# Patient Record
Sex: Male | Born: 1935 | Race: Black or African American | Hispanic: No | Smoking: Former smoker
Health system: Southern US, Community
[De-identification: ages and names within clinical notes are randomized; demographics above are authoritative.]

## PROBLEM LIST (undated history)

## (undated) DIAGNOSIS — N183 Chronic kidney disease, stage 3 unspecified: Secondary | ICD-10-CM

## (undated) DIAGNOSIS — K219 Gastro-esophageal reflux disease without esophagitis: Secondary | ICD-10-CM

## (undated) DIAGNOSIS — N400 Enlarged prostate without lower urinary tract symptoms: Secondary | ICD-10-CM

## (undated) DIAGNOSIS — I10 Essential (primary) hypertension: Secondary | ICD-10-CM

## (undated) DIAGNOSIS — IMO0002 Reserved for concepts with insufficient information to code with codable children: Secondary | ICD-10-CM

## (undated) DIAGNOSIS — M1990 Unspecified osteoarthritis, unspecified site: Secondary | ICD-10-CM

## (undated) DIAGNOSIS — I2510 Atherosclerotic heart disease of native coronary artery without angina pectoris: Secondary | ICD-10-CM

## (undated) DIAGNOSIS — M353 Polymyalgia rheumatica: Secondary | ICD-10-CM

## (undated) DIAGNOSIS — K529 Noninfective gastroenteritis and colitis, unspecified: Secondary | ICD-10-CM

## (undated) DIAGNOSIS — E785 Hyperlipidemia, unspecified: Secondary | ICD-10-CM

## (undated) DIAGNOSIS — I639 Cerebral infarction, unspecified: Secondary | ICD-10-CM

## (undated) HISTORY — DX: Chronic kidney disease, stage 3 unspecified: N18.30

## (undated) HISTORY — DX: Cerebral infarction, unspecified: I63.9

## (undated) HISTORY — DX: Gastro-esophageal reflux disease without esophagitis: K21.9

## (undated) HISTORY — PX: ANGIOPLASTY: SHX39

## (undated) HISTORY — DX: Essential (primary) hypertension: I10

## (undated) HISTORY — DX: Reserved for concepts with insufficient information to code with codable children: IMO0002

## (undated) HISTORY — PX: CATARACT EXTRACTION: SUR2

## (undated) HISTORY — DX: Chronic kidney disease, stage 3 (moderate): N18.3

## (undated) HISTORY — DX: Hyperlipidemia, unspecified: E78.5

## (undated) HISTORY — DX: Unspecified osteoarthritis, unspecified site: M19.90

## (undated) HISTORY — PX: ESOPHAGEAL DILATION: SHX303

## (undated) HISTORY — DX: Benign prostatic hyperplasia without lower urinary tract symptoms: N40.0

## (undated) HISTORY — DX: Atherosclerotic heart disease of native coronary artery without angina pectoris: I25.10

## (undated) HISTORY — DX: Noninfective gastroenteritis and colitis, unspecified: K52.9

## (undated) HISTORY — DX: Polymyalgia rheumatica: M35.3

---

## 2004-04-07 HISTORY — PX: US ECHOCARDIOGRAPHY: HXRAD669

## 2004-04-11 ENCOUNTER — Other Ambulatory Visit: Payer: Self-pay

## 2004-11-11 ENCOUNTER — Ambulatory Visit: Payer: Self-pay | Admitting: Internal Medicine

## 2005-03-11 ENCOUNTER — Ambulatory Visit: Payer: Self-pay | Admitting: Internal Medicine

## 2005-06-08 ENCOUNTER — Ambulatory Visit: Payer: Self-pay | Admitting: Rheumatology

## 2005-07-13 ENCOUNTER — Ambulatory Visit: Payer: Self-pay | Admitting: Internal Medicine

## 2005-08-29 ENCOUNTER — Ambulatory Visit: Payer: Self-pay | Admitting: Internal Medicine

## 2005-09-01 ENCOUNTER — Ambulatory Visit: Payer: Self-pay | Admitting: Internal Medicine

## 2005-09-01 ENCOUNTER — Encounter: Payer: Self-pay | Admitting: Internal Medicine

## 2005-09-07 ENCOUNTER — Encounter: Payer: Self-pay | Admitting: Internal Medicine

## 2005-11-07 ENCOUNTER — Inpatient Hospital Stay: Payer: Self-pay

## 2005-11-07 ENCOUNTER — Other Ambulatory Visit: Payer: Self-pay

## 2005-11-14 ENCOUNTER — Ambulatory Visit: Payer: Self-pay | Admitting: Internal Medicine

## 2005-12-27 ENCOUNTER — Ambulatory Visit: Payer: Self-pay | Admitting: Internal Medicine

## 2006-03-21 ENCOUNTER — Ambulatory Visit: Payer: Self-pay | Admitting: Internal Medicine

## 2006-03-29 ENCOUNTER — Ambulatory Visit: Payer: Self-pay | Admitting: Internal Medicine

## 2006-04-26 ENCOUNTER — Ambulatory Visit: Payer: Self-pay | Admitting: Internal Medicine

## 2006-05-08 ENCOUNTER — Other Ambulatory Visit: Payer: Self-pay

## 2006-05-08 ENCOUNTER — Encounter: Payer: Self-pay | Admitting: Internal Medicine

## 2006-05-08 ENCOUNTER — Emergency Department: Payer: Self-pay | Admitting: Emergency Medicine

## 2006-05-29 ENCOUNTER — Encounter: Payer: Self-pay | Admitting: Internal Medicine

## 2006-06-13 ENCOUNTER — Ambulatory Visit: Payer: Self-pay | Admitting: Internal Medicine

## 2006-08-30 ENCOUNTER — Ambulatory Visit: Payer: Self-pay | Admitting: Internal Medicine

## 2006-11-22 ENCOUNTER — Encounter: Payer: Self-pay | Admitting: Internal Medicine

## 2007-01-01 ENCOUNTER — Ambulatory Visit: Payer: Self-pay | Admitting: Internal Medicine

## 2007-01-01 LAB — CONVERTED CEMR LAB
ALT: 12 units/L (ref 0–40)
HDL: 39.5 mg/dL (ref >39.0)
Total CHOL/HDL Ratio: 2.9

## 2007-04-20 DIAGNOSIS — I25119 Atherosclerotic heart disease of native coronary artery with unspecified angina pectoris: Secondary | ICD-10-CM

## 2007-04-20 DIAGNOSIS — E785 Hyperlipidemia, unspecified: Secondary | ICD-10-CM

## 2007-04-20 DIAGNOSIS — K219 Gastro-esophageal reflux disease without esophagitis: Secondary | ICD-10-CM

## 2007-04-27 DIAGNOSIS — E1140 Type 2 diabetes mellitus with diabetic neuropathy, unspecified: Secondary | ICD-10-CM

## 2007-04-27 DIAGNOSIS — N400 Enlarged prostate without lower urinary tract symptoms: Secondary | ICD-10-CM

## 2007-04-27 DIAGNOSIS — E1165 Type 2 diabetes mellitus with hyperglycemia: Secondary | ICD-10-CM

## 2007-04-27 DIAGNOSIS — I2510 Atherosclerotic heart disease of native coronary artery without angina pectoris: Secondary | ICD-10-CM | POA: Insufficient documentation

## 2007-05-07 ENCOUNTER — Ambulatory Visit: Payer: Self-pay | Admitting: Internal Medicine

## 2007-05-07 DIAGNOSIS — M1990 Unspecified osteoarthritis, unspecified site: Secondary | ICD-10-CM | POA: Insufficient documentation

## 2007-05-08 LAB — CONVERTED CEMR LAB
Albumin: 3.4 g/dL — ABNORMAL LOW
BUN: 26 mg/dL — ABNORMAL HIGH
CO2: 31 meq/L
Calcium: 9.3 mg/dL
Chloride: 108 meq/L
Creatinine, Ser: 1.5 mg/dL
GFR calc Af Amer: 57 mL/min
GFR calc non Af Amer: 47 mL/min
Glucose, Bld: 157 mg/dL — ABNORMAL HIGH
Hgb A1c MFr Bld: 6.5 % — ABNORMAL HIGH
Phosphorus: 2.9 mg/dL
Potassium: 4.5 meq/L
Sodium: 143 meq/L

## 2007-05-23 ENCOUNTER — Encounter: Payer: Self-pay | Admitting: Internal Medicine

## 2007-05-28 ENCOUNTER — Encounter: Payer: Self-pay | Admitting: Internal Medicine

## 2007-08-08 ENCOUNTER — Ambulatory Visit: Payer: Self-pay | Admitting: Internal Medicine

## 2007-08-08 LAB — CONVERTED CEMR LAB
Blood in Urine, dipstick: NEGATIVE
Ketones, urine, test strip: NEGATIVE
Protein, U semiquant: 30
RBC / HPF: 0
WBC Urine, dipstick: NEGATIVE
pH: 5

## 2007-11-07 ENCOUNTER — Encounter: Payer: Self-pay | Admitting: Internal Medicine

## 2007-11-22 ENCOUNTER — Encounter: Payer: Self-pay | Admitting: Internal Medicine

## 2007-12-03 ENCOUNTER — Telehealth (INDEPENDENT_AMBULATORY_CARE_PROVIDER_SITE_OTHER): Payer: Self-pay | Admitting: *Deleted

## 2007-12-10 ENCOUNTER — Telehealth (INDEPENDENT_AMBULATORY_CARE_PROVIDER_SITE_OTHER): Payer: Self-pay | Admitting: *Deleted

## 2007-12-11 ENCOUNTER — Ambulatory Visit: Payer: Self-pay | Admitting: Internal Medicine

## 2007-12-12 LAB — CONVERTED CEMR LAB
ALT: 9 units/L (ref 0–53)
Albumin: 3.3 g/dL — ABNORMAL LOW (ref 3.5–5.2)
BUN: 20 mg/dL (ref 6–23)
Calcium: 9.4 mg/dL (ref 8.4–10.5)
Eosinophils Absolute: 0.5 10*3/uL (ref 0.0–0.6)
Eosinophils Relative: 6.8 % — ABNORMAL HIGH (ref 0.0–5.0)
GFR calc non Af Amer: 51 mL/min
Glucose, Bld: 153 mg/dL — ABNORMAL HIGH (ref 70–99)
HDL: 39.9 mg/dL (ref >39.0)
Hgb A1c MFr Bld: 6.2 % — ABNORMAL HIGH (ref 4.6–6.0)
MCHC: 31.9 g/dL (ref 30.0–36.0)
MCV: 90.2 fL (ref 78.0–100.0)
Neutro Abs: 5.6 10*3/uL (ref 1.4–7.7)
Neutrophils Relative %: 70.2 % (ref 43.0–77.0)
Platelets: 183 10*3/uL (ref 150–400)
Potassium: 4.6 meq/L (ref 3.5–5.1)
RBC: 4.21 M/uL — ABNORMAL LOW (ref 4.22–5.81)
RDW: 11.6 % (ref 11.5–14.6)
TSH: 1.26 microintl units/mL (ref 0.35–5.50)
VLDL: 8 mg/dL (ref 0–40)

## 2008-02-19 ENCOUNTER — Telehealth: Payer: Self-pay | Admitting: Family Medicine

## 2008-02-25 ENCOUNTER — Encounter: Payer: Self-pay | Admitting: Internal Medicine

## 2008-04-10 ENCOUNTER — Ambulatory Visit: Payer: Self-pay | Admitting: Internal Medicine

## 2008-04-10 DIAGNOSIS — A6920 Lyme disease, unspecified: Secondary | ICD-10-CM

## 2008-06-10 ENCOUNTER — Telehealth (INDEPENDENT_AMBULATORY_CARE_PROVIDER_SITE_OTHER): Payer: Self-pay | Admitting: *Deleted

## 2008-07-28 ENCOUNTER — Ambulatory Visit: Payer: Self-pay | Admitting: Internal Medicine

## 2008-07-28 ENCOUNTER — Telehealth (INDEPENDENT_AMBULATORY_CARE_PROVIDER_SITE_OTHER): Payer: Self-pay | Admitting: *Deleted

## 2008-07-28 DIAGNOSIS — M549 Dorsalgia, unspecified: Secondary | ICD-10-CM | POA: Insufficient documentation

## 2008-07-28 LAB — CONVERTED CEMR LAB
Ketones, urine, test strip: NEGATIVE
Nitrite: NEGATIVE
Protein, U semiquant: 30
Urobilinogen, UA: 0.2
pH: 6

## 2008-07-29 ENCOUNTER — Emergency Department: Payer: Self-pay | Admitting: Emergency Medicine

## 2008-08-15 ENCOUNTER — Ambulatory Visit: Payer: Self-pay | Admitting: Internal Medicine

## 2008-08-18 LAB — CONVERTED CEMR LAB
ALT: 10 units/L (ref 0–53)
AST: 16 units/L (ref 0–37)
Bilirubin, Direct: 0.1 mg/dL (ref 0.0–0.3)
CO2: 29 meq/L (ref 19–32)
Calcium: 9.3 mg/dL (ref 8.4–10.5)
Chloride: 101 meq/L (ref 96–112)
Creatinine, Ser: 1.4 mg/dL (ref 0.4–1.5)
GFR calc Af Amer: 62 mL/min
HCT: 40.2 % (ref 39.0–52.0)
LDL Cholesterol: 68 mg/dL (ref 0–99)
MCHC: 33 g/dL (ref 30.0–36.0)
Monocytes Relative: 9.4 % (ref 3.0–12.0)
Neutro Abs: 4.5 10*3/uL (ref 1.4–7.7)
Neutrophils Relative %: 64.9 % (ref 43.0–77.0)
Phosphorus: 3 mg/dL (ref 2.3–4.6)
Potassium: 4.3 meq/L (ref 3.5–5.1)
RBC: 4.48 M/uL (ref 4.22–5.81)
RDW: 11.3 % — ABNORMAL LOW (ref 11.5–14.6)
TSH: 1.78 microintl units/mL (ref 0.35–5.50)
VLDL: 16 mg/dL (ref 0–40)

## 2008-08-21 ENCOUNTER — Inpatient Hospital Stay: Payer: Self-pay | Admitting: Internal Medicine

## 2008-08-26 ENCOUNTER — Encounter: Payer: Self-pay | Admitting: Internal Medicine

## 2008-08-27 ENCOUNTER — Encounter: Payer: Self-pay | Admitting: Internal Medicine

## 2008-08-28 ENCOUNTER — Telehealth: Payer: Self-pay | Admitting: Internal Medicine

## 2008-08-28 ENCOUNTER — Ambulatory Visit: Payer: Self-pay | Admitting: Internal Medicine

## 2008-08-28 DIAGNOSIS — M5137 Other intervertebral disc degeneration, lumbosacral region: Secondary | ICD-10-CM

## 2008-08-28 DIAGNOSIS — I10 Essential (primary) hypertension: Secondary | ICD-10-CM

## 2008-09-01 ENCOUNTER — Encounter: Payer: Self-pay | Admitting: Internal Medicine

## 2008-09-08 ENCOUNTER — Encounter: Payer: Self-pay | Admitting: Internal Medicine

## 2008-09-29 ENCOUNTER — Ambulatory Visit: Payer: Self-pay | Admitting: Internal Medicine

## 2008-09-30 LAB — CONVERTED CEMR LAB
Basophils Absolute: 0 10*3/uL (ref 0.0–0.1)
Basophils Relative: 0.2 % (ref 0.0–3.0)
Calcium: 9.1 mg/dL (ref 8.4–10.5)
Eosinophils Absolute: 0.5 10*3/uL (ref 0.0–0.7)
HCT: 31.7 % — ABNORMAL LOW (ref 39.0–52.0)
Hemoglobin: 10.7 g/dL — ABNORMAL LOW (ref 13.0–17.0)
Lymphocytes Relative: 12.9 % (ref 12.0–46.0)
Monocytes Absolute: 0.8 10*3/uL (ref 0.1–1.0)
Neutro Abs: 5.6 10*3/uL (ref 1.4–7.7)
Neutrophils Relative %: 71.6 % (ref 43.0–77.0)
Platelets: 199 10*3/uL (ref 150–400)
Potassium: 4.7 meq/L (ref 3.5–5.1)
RDW: 11.3 % — ABNORMAL LOW (ref 11.5–14.6)
Sodium: 141 meq/L (ref 135–145)

## 2008-10-06 ENCOUNTER — Telehealth (INDEPENDENT_AMBULATORY_CARE_PROVIDER_SITE_OTHER): Payer: Self-pay | Admitting: *Deleted

## 2008-10-28 ENCOUNTER — Ambulatory Visit: Payer: Self-pay | Admitting: Internal Medicine

## 2008-11-03 ENCOUNTER — Telehealth: Payer: Self-pay | Admitting: Internal Medicine

## 2008-12-15 ENCOUNTER — Encounter: Payer: Self-pay | Admitting: Internal Medicine

## 2008-12-16 ENCOUNTER — Telehealth: Payer: Self-pay | Admitting: Internal Medicine

## 2008-12-16 ENCOUNTER — Ambulatory Visit: Payer: Self-pay | Admitting: Internal Medicine

## 2008-12-17 LAB — CONVERTED CEMR LAB
ALT: 8 units/L (ref 0–53)
Basophils Absolute: 0 10*3/uL (ref 0.0–0.1)
CO2: 28 meq/L (ref 19–32)
Cholesterol: 97 mg/dL (ref 0–200)
Creatinine, Ser: 1.3 mg/dL (ref 0.4–1.5)
Glucose, Bld: 157 mg/dL — ABNORMAL HIGH (ref 70–99)
HDL: 32.1 mg/dL — ABNORMAL LOW (ref >39.0)
Hemoglobin: 11.1 g/dL — ABNORMAL LOW (ref 13.0–17.0)
Hgb A1c MFr Bld: 6.1 % — ABNORMAL HIGH (ref 4.6–6.0)
LDL Cholesterol: 54 mg/dL (ref 0–99)
Lymphocytes Relative: 18.2 % (ref 12.0–46.0)
Monocytes Absolute: 0.8 10*3/uL (ref 0.1–1.0)
Neutro Abs: 4.8 10*3/uL (ref 1.4–7.7)
Neutrophils Relative %: 64.5 % (ref 43.0–77.0)
Phosphorus: 2.9 mg/dL (ref 2.3–4.6)
Potassium: 4.7 meq/L (ref 3.5–5.1)
Sodium: 138 meq/L (ref 135–145)
Total Bilirubin: 1 mg/dL (ref 0.3–1.2)
Total CHOL/HDL Ratio: 3
Total Protein: 7.6 g/dL (ref 6.0–8.3)
Triglycerides: 55 mg/dL (ref 0–149)
WBC: 7.4 10*3/uL (ref 4.5–10.5)

## 2009-01-02 ENCOUNTER — Telehealth: Payer: Self-pay | Admitting: Internal Medicine

## 2009-01-02 ENCOUNTER — Encounter: Payer: Self-pay | Admitting: Internal Medicine

## 2009-03-04 ENCOUNTER — Encounter: Payer: Self-pay | Admitting: Internal Medicine

## 2009-04-03 ENCOUNTER — Telehealth: Payer: Self-pay | Admitting: Internal Medicine

## 2009-04-14 ENCOUNTER — Ambulatory Visit: Payer: Self-pay | Admitting: Internal Medicine

## 2009-06-04 ENCOUNTER — Encounter: Payer: Self-pay | Admitting: Internal Medicine

## 2009-07-20 ENCOUNTER — Encounter: Payer: Self-pay | Admitting: Internal Medicine

## 2009-07-26 ENCOUNTER — Encounter: Payer: Self-pay | Admitting: Internal Medicine

## 2009-07-27 ENCOUNTER — Ambulatory Visit: Payer: Self-pay | Admitting: Internal Medicine

## 2009-07-27 ENCOUNTER — Ambulatory Visit: Payer: Self-pay | Admitting: Cardiology

## 2009-07-27 ENCOUNTER — Inpatient Hospital Stay (HOSPITAL_COMMUNITY): Admission: EM | Admit: 2009-07-27 | Discharge: 2009-07-30 | Payer: Self-pay | Admitting: Emergency Medicine

## 2009-07-28 ENCOUNTER — Encounter (INDEPENDENT_AMBULATORY_CARE_PROVIDER_SITE_OTHER): Payer: Self-pay | Admitting: Internal Medicine

## 2009-07-29 ENCOUNTER — Encounter: Payer: Self-pay | Admitting: Cardiology

## 2009-08-03 ENCOUNTER — Telehealth: Payer: Self-pay | Admitting: Internal Medicine

## 2009-08-13 ENCOUNTER — Ambulatory Visit: Payer: Self-pay | Admitting: Internal Medicine

## 2009-08-14 ENCOUNTER — Ambulatory Visit: Payer: Self-pay | Admitting: Internal Medicine

## 2009-09-07 ENCOUNTER — Encounter: Payer: Self-pay | Admitting: Internal Medicine

## 2009-09-14 ENCOUNTER — Encounter: Payer: Self-pay | Admitting: Internal Medicine

## 2009-09-14 ENCOUNTER — Telehealth: Payer: Self-pay | Admitting: Internal Medicine

## 2009-11-13 ENCOUNTER — Encounter: Payer: Self-pay | Admitting: Family Medicine

## 2009-11-19 ENCOUNTER — Telehealth: Payer: Self-pay | Admitting: Internal Medicine

## 2009-11-24 ENCOUNTER — Encounter: Payer: Self-pay | Admitting: Internal Medicine

## 2009-12-03 ENCOUNTER — Telehealth: Payer: Self-pay | Admitting: Internal Medicine

## 2009-12-15 ENCOUNTER — Encounter: Payer: Self-pay | Admitting: Internal Medicine

## 2009-12-15 ENCOUNTER — Telehealth: Payer: Self-pay | Admitting: Internal Medicine

## 2009-12-28 ENCOUNTER — Ambulatory Visit: Payer: Self-pay | Admitting: Internal Medicine

## 2009-12-29 LAB — CONVERTED CEMR LAB
AST: 17 units/L (ref 0–37)
Albumin: 3.6 g/dL (ref 3.5–5.2)
Alkaline Phosphatase: 68 units/L (ref 39–117)
Basophils Relative: 0.1 % (ref 0.0–3.0)
CO2: 31 meq/L (ref 19–32)
Calcium: 9.2 mg/dL (ref 8.4–10.5)
Chloride: 108 meq/L (ref 96–112)
HCT: 38.8 % — ABNORMAL LOW (ref 39.0–52.0)
Hemoglobin: 12.6 g/dL — ABNORMAL LOW (ref 13.0–17.0)
Hgb A1c MFr Bld: 7 % — ABNORMAL HIGH (ref 4.6–6.5)
Lymphocytes Relative: 15.7 % (ref 12.0–46.0)
MCHC: 32.4 g/dL (ref 30.0–36.0)
Neutrophils Relative %: 64 % (ref 43.0–77.0)
Phosphorus: 3.5 mg/dL (ref 2.3–4.6)
Potassium: 4.4 meq/L (ref 3.5–5.1)
Sodium: 142 meq/L (ref 135–145)
TSH: 2.59 microintl units/mL (ref 0.35–5.50)
Total Bilirubin: 0.7 mg/dL (ref 0.3–1.2)
Total CHOL/HDL Ratio: 2
Total Protein: 7.4 g/dL (ref 6.0–8.3)
Triglycerides: 67 mg/dL (ref 0.0–149.0)
WBC: 6.5 10*3/uL (ref 4.5–10.5)

## 2010-01-07 ENCOUNTER — Telehealth: Payer: Self-pay | Admitting: Internal Medicine

## 2010-01-13 ENCOUNTER — Telehealth: Payer: Self-pay | Admitting: Internal Medicine

## 2010-01-25 ENCOUNTER — Ambulatory Visit: Payer: Self-pay | Admitting: Internal Medicine

## 2010-01-25 DIAGNOSIS — M353 Polymyalgia rheumatica: Secondary | ICD-10-CM | POA: Insufficient documentation

## 2010-01-26 LAB — CONVERTED CEMR LAB
Basophils Absolute: 0 10*3/uL (ref 0.0–0.1)
Basophils Relative: 0.2 % (ref 0.0–3.0)
Hemoglobin: 12.7 g/dL — ABNORMAL LOW (ref 13.0–17.0)
MCV: 91.8 fL (ref 78.0–100.0)
Monocytes Absolute: 0.7 10*3/uL (ref 0.1–1.0)
Neutro Abs: 8.7 10*3/uL — ABNORMAL HIGH (ref 1.4–7.7)
Platelets: 159 10*3/uL (ref 150.0–400.0)
RBC: 4.26 M/uL (ref 4.22–5.81)
Sed Rate: 16 mm/hr (ref 0–22)

## 2010-03-01 ENCOUNTER — Telehealth: Payer: Self-pay | Admitting: Internal Medicine

## 2010-04-27 ENCOUNTER — Ambulatory Visit: Payer: Self-pay | Admitting: Internal Medicine

## 2010-04-27 LAB — CONVERTED CEMR LAB
Albumin: 3.8 g/dL (ref 3.5–5.2)
BUN: 33 mg/dL — ABNORMAL HIGH (ref 6–23)
Basophils Absolute: 0 10*3/uL (ref 0.0–0.1)
CO2: 30 meq/L (ref 19–32)
Chloride: 100 meq/L (ref 96–112)
Creatinine, Ser: 1.6 mg/dL — ABNORMAL HIGH (ref 0.4–1.5)
Eosinophils Absolute: 0 10*3/uL (ref 0.0–0.7)
Eosinophils Relative: 0.4 % (ref 0.0–5.0)
Glucose, Bld: 387 mg/dL — ABNORMAL HIGH (ref 70–99)
HCT: 38.2 % — ABNORMAL LOW (ref 39.0–52.0)
Hemoglobin: 12.8 g/dL — ABNORMAL LOW (ref 13.0–17.0)
Neutro Abs: 6.7 10*3/uL (ref 1.4–7.7)
RBC: 4.15 M/uL — ABNORMAL LOW (ref 4.22–5.81)
RDW: 12.2 % (ref 11.5–14.6)
Sed Rate: 20 mm/hr (ref 0–22)

## 2010-04-27 LAB — HM DIABETES FOOT EXAM

## 2010-05-21 ENCOUNTER — Ambulatory Visit: Payer: Self-pay | Admitting: Family Medicine

## 2010-05-24 ENCOUNTER — Telehealth: Payer: Self-pay | Admitting: Family Medicine

## 2010-05-24 LAB — HM DIABETES EYE EXAM

## 2010-05-27 ENCOUNTER — Encounter: Payer: Self-pay | Admitting: Internal Medicine

## 2010-06-26 ENCOUNTER — Inpatient Hospital Stay (HOSPITAL_COMMUNITY): Admission: EM | Admit: 2010-06-26 | Discharge: 2010-06-29 | Payer: Self-pay | Admitting: Emergency Medicine

## 2010-06-26 ENCOUNTER — Ambulatory Visit: Payer: Self-pay | Admitting: Surgery

## 2010-06-26 ENCOUNTER — Encounter (INDEPENDENT_AMBULATORY_CARE_PROVIDER_SITE_OTHER): Payer: Self-pay | Admitting: Internal Medicine

## 2010-07-01 ENCOUNTER — Encounter: Payer: Self-pay | Admitting: Internal Medicine

## 2010-07-05 ENCOUNTER — Ambulatory Visit: Payer: Self-pay | Admitting: Internal Medicine

## 2010-07-06 LAB — CONVERTED CEMR LAB
Albumin: 3.3 g/dL — ABNORMAL LOW (ref 3.5–5.2)
BUN: 36 mg/dL — ABNORMAL HIGH (ref 6–23)
Calcium: 8.9 mg/dL (ref 8.4–10.5)
Chloride: 102 meq/L (ref 96–112)
Creatinine, Ser: 1.6 mg/dL — ABNORMAL HIGH (ref 0.4–1.5)
Eosinophils Relative: 3.3 % (ref 0.0–5.0)
GFR calc non Af Amer: 54.43 mL/min (ref >60)
Glucose, Bld: 165 mg/dL — ABNORMAL HIGH (ref 70–99)
Lymphs Abs: 1.4 10*3/uL (ref 0.7–4.0)
MCHC: 33.5 g/dL (ref 30.0–36.0)
MCV: 91.2 fL (ref 78.0–100.0)
Phosphorus: 2.2 mg/dL — ABNORMAL LOW (ref 2.3–4.6)
Platelets: 249 10*3/uL (ref 150.0–400.0)
Potassium: 4.5 meq/L (ref 3.5–5.1)
RBC: 3.56 M/uL — ABNORMAL LOW (ref 4.22–5.81)
Sodium: 142 meq/L (ref 135–145)

## 2010-07-14 ENCOUNTER — Telehealth: Payer: Self-pay | Admitting: Internal Medicine

## 2010-07-14 ENCOUNTER — Ambulatory Visit: Payer: Self-pay | Admitting: Internal Medicine

## 2010-07-16 ENCOUNTER — Telehealth: Payer: Self-pay | Admitting: Internal Medicine

## 2010-07-20 ENCOUNTER — Encounter: Payer: Self-pay | Admitting: Internal Medicine

## 2010-07-30 ENCOUNTER — Ambulatory Visit: Payer: Self-pay | Admitting: Internal Medicine

## 2010-08-02 LAB — CONVERTED CEMR LAB
Albumin: 3.6 g/dL (ref 3.5–5.2)
Basophils Absolute: 0 10*3/uL (ref 0.0–0.1)
Calcium: 9 mg/dL (ref 8.4–10.5)
Eosinophils Absolute: 0.5 10*3/uL (ref 0.0–0.7)
Eosinophils Relative: 5.7 % — ABNORMAL HIGH (ref 0.0–5.0)
HCT: 34.6 % — ABNORMAL LOW (ref 39.0–52.0)
Lymphs Abs: 1.5 10*3/uL (ref 0.7–4.0)
MCHC: 33.3 g/dL (ref 30.0–36.0)
MCV: 90.1 fL (ref 78.0–100.0)
Neutro Abs: 5.6 10*3/uL (ref 1.4–7.7)
Platelets: 169 10*3/uL (ref 150.0–400.0)
RBC: 3.85 M/uL — ABNORMAL LOW (ref 4.22–5.81)
RDW: 12.7 % (ref 11.5–14.6)
Sodium: 138 meq/L (ref 135–145)

## 2010-09-24 ENCOUNTER — Ambulatory Visit: Payer: Self-pay | Admitting: Internal Medicine

## 2010-09-25 LAB — CONVERTED CEMR LAB
Chloride: 98 meq/L (ref 96–112)
Creatinine, Ser: 1.7 mg/dL — ABNORMAL HIGH (ref 0.4–1.5)
Eosinophils Absolute: 0.2 10*3/uL (ref 0.0–0.7)
Eosinophils Relative: 2.5 % (ref 0.0–5.0)
HCT: 35.4 % — ABNORMAL LOW (ref 39.0–52.0)
Hemoglobin: 11.7 g/dL — ABNORMAL LOW (ref 13.0–17.0)
Lymphocytes Relative: 15.6 % (ref 12.0–46.0)
Lymphs Abs: 1.2 10*3/uL (ref 0.7–4.0)
Monocytes Relative: 15.7 % — ABNORMAL HIGH (ref 3.0–12.0)
Platelets: 240 10*3/uL (ref 150.0–400.0)
Potassium: 4.7 meq/L (ref 3.5–5.1)
RBC: 4.07 M/uL — ABNORMAL LOW (ref 4.22–5.81)
RDW: 12.4 % (ref 11.5–14.6)

## 2010-11-16 ENCOUNTER — Telehealth: Payer: Self-pay | Admitting: Internal Medicine

## 2010-11-26 ENCOUNTER — Encounter: Payer: Self-pay | Admitting: Internal Medicine

## 2010-11-29 ENCOUNTER — Telehealth: Payer: Self-pay | Admitting: Internal Medicine

## 2010-11-30 ENCOUNTER — Encounter: Payer: Self-pay | Admitting: Internal Medicine

## 2010-12-08 NOTE — Assessment & Plan Note (Signed)
Summary: FOLLOW UP   Vital Signs:  Patient profile:   77 year old male Weight:      171 pounds Temp:     98.3 degrees F oral Pulse rate:   72 / minute Pulse rhythm:   regular BP sitting:   120 / 60  (left arm) Cuff size:   large  Vitals Entered By: Mervin Hack CMA [facility] Dull) (September 24, 2010 12:30 PM) CC: follow-up visit   History of Present Illness: Went to eye doctor yesterday Got wobbly  after the appt--- may have had low sugar reaction no dilation  stays chilly a lot  ongoing left shoulder pain---bettter when he gets up and isn't lying on it achiness is back in shoulders and knees wonders about prednisone again  Blood sugar 214 yesterday after glucose though usually under 180 Hasn't needed any insulin  Had some burning dysuria took azo for 2 days Occ trouble voiding Nocturia x 3-4  Has some swelling in left testicle no fever no urethral discharge  No chest pain Breathing is okay--occ DOE no edema  Allergies: 1)  * Penicillin  Past History:  Past medical, surgical, family and social histories (including risk factors) reviewed for relevance to current acute and chronic problems.  Past Medical History: Reviewed history from 01/25/2010 and no changes required. Coronary artery disease-----------------------------------------Dr Callwood Diabetes mellitus, type II with neuropathy GERD Hyperlipidemia Benign prostatic hypertrophy------------------------------------Dr Artis Flock Osteoarthrits Degenerative disk disease---------------------------------------Dr Deeann Saint Hypertension Polymyalgia rheumatica --3/11  Past Surgical History: Reviewed history from 04/20/2007 and no changes required. Cataract extraction glaucoma  1999 1992 angioplasty 1995 esoph dilation  03/1999 laser  o.s. [facility] ) 11/2000 CVA 11/02 myoview  fixed defects  6/05 echo benign  04/2004 gastroenteritis 11/2005 echo ef 58% apical inf dyskinesia  7/07  Family  History: Reviewed history and no changes required.  Social History: Reviewed history from 12/11/2007 and no changes required. Retired Widowed 11/ 2008 Never Smoked Alcohol use-no Religion affecting care--involved with Church throughout life (deacon, etc)  Review of Systems       appetite is fairly good until the past few days weight is down 3# sleeps okay  Physical Exam  General:  alert and normal appearance.   Neck:  supple, no masses, no thyromegaly, no carotid bruits, and no cervical lymphadenopathy.   Lungs:  normal respiratory effort, no intercostal retractions, no accessory muscle use, and normal breath sounds.   Heart:  normal rate, regular rhythm, and no gallop.   ?trace systolic murmur at LLSB Abdomen:  soft and non-tender.   Genitalia:  moderate induration, redness and tenderness of left testicle Extremities:  no sig edema Psych:  normally interactive, good eye contact, not anxious appearing, and not depressed appearing.     Impression & Recommendations:  Problem # 1:  EPIDIDYMO-ORCHITIS (ICD-604.90) Assessment New probably causing his urinary symptoms also will treat with doxy for 2 weeks  Problem # 2:  POLYMYALGIA RHEUMATICA (ICD-725) Assessment: Deteriorated  more aching if sed rate up (last 34), will restart the prednisone  Orders: TLB-Sedimentation Rate (ESR) (85652-ESR)  Problem # 3:  DIABETES MELLITUS, TYPE II, WITH NEUROLOGICAL COMPLICATIONS (ICD-250.60) Assessment: Comment Only  seems to be okay had hypoglycemic spell yesterday no insulin unless over 250  His updated medication list for this problem includes:    Novolog 100 Unit/ml Soln (Insulin aspart) ..... Use three times a day based on coverage    Glipizide 2.5 Mg Xr24h-tab (Glipizide) .Marland Kitchen... 1 tab daily before breakfast for diabetes  Labs Reviewed: Creat: 1.5 (  07/30/2010)     Last Eye Exam: No diabetic retinopathy.    (05/24/2010) Reviewed HgBA1c results: 8.4 (04/27/2010)  7.0  (12/28/2009)  Orders: TLB-A1C / Hgb A1C (Glycohemoglobin) (83036-A1C) Venipuncture (04540) TLB-Renal Function Panel (80069-RENAL) TLB-CBC Platelet - w/Differential (85025-CBCD)  Complete Medication List: 1)  Novolog 100 Unit/ml Soln (Insulin aspart) .... Use three times a day based on coverage 2)  Glipizide 2.5 Mg Xr24h-tab (Glipizide) .Marland Kitchen.. 1 tab daily before breakfast for diabetes 3)  Flomax 0.4 Mg Cp24 (Tamsulosin hcl) .... Take 1 capsule by mouth once a day 4)  Plavix 75 Mg Tabs (Clopidogrel bisulfate) .... Take 1 tablet by mouth once a day 5)  Pravastatin Sodium 40 Mg Tabs (Pravastatin sodium) .... Take 1 tablet by mouth once a day 6)  Verapamil Hcl 40 Mg Tabs (Verapamil hcl) .Marland Kitchen.. 1 two times a day 7)  Hydralazine Hcl 50 Mg Tabs (Hydralazine hcl) .... Take 1 tablet by mouth four times a day 8)  Nitroquick 0.4 Mg Subl (Nitroglycerin) .... As needed for chest pain 9)  Combigan 0.2-0.5 % Soln (Brimonidine tartrate-timolol) .... Instill 1 drop in each eye two times a day 10)  Xalatan 0.005 % Soln (Latanoprost) .... Instill 1 drop in each eye at bedtime 11)  Freestyle Lite Test Strp (Glucose blood) .... Patient test blood sugar three times a day 12)  Permethrin 5 % Crea (Permethrin) .... Apply 5% crm x1; info: massage into skin from head to feet, leave on 8-14h, wash off; info: repeat in 14 days if needed 13)  Miralax Powd (Polyethylene glycol 3350) .... Take 17 grams by mouth daily with a full glass of water 14)  Doxycycline Hyclate 100 Mg Caps (Doxycycline hyclate) .Marland Kitchen.. 1 tab by mouth two times a day for infection  Patient Instructions: 1)  Please schedule a follow-up appointment in 3 months--please cancel the December appt Prescriptions: DOXYCYCLINE HYCLATE 100 MG CAPS (DOXYCYCLINE HYCLATE) 1 tab by mouth two times a day for infection  #28 x 0   Entered and Authorized by:   Cindee Salt MD   Signed by:   Cindee Salt MD on 09/24/2010   Method used:   Electronically to         [facility]* (retail)       [address] [place], [place]  [address]       Ph: [phone]       Fax: [phone]   RxID:   [id number] GLIPIZIDE 2.5 MG XR24H-TAB (GLIPIZIDE) 1 tab daily before breakfast for diabetes  #90 x 3   Entered by:   Mervin Hack CMA (AAMA)   Authorized by:   Cindee Salt MD   Signed by:   Mervin Hack CMA (AAMA) on 09/24/2010   Method used:   Electronically to        [facility] [address] (retail)       [address]       [place], [place]  [address]       Ph: [phone]       Fax: [phone]   RxID:   [id number]    Orders Added: 1)  Est. Patient Level IV [60109] 2)  TLB-Sedimentation Rate (ESR) [85652-ESR] 3)  TLB-A1C / Hgb A1C (Glycohemoglobin) [83036-A1C] 4)  Venipuncture [36415] 5)  TLB-Renal Function Panel [80069-RENAL] 6)  TLB-CBC Platelet - w/Differential [85025-CBCD]    Current Allergies (reviewed today): * PENICILLIN

## 2010-12-08 NOTE — Progress Notes (Signed)
Summary: Rx Flomax  Phone Note Refill Request Call back at [phone] Message from:  [facility] on December 03, 2009 4:39 PM  Refills Requested: Medication #1:  FLOMAX 0.4 MG CP24 Take 1 capsule by mouth once a day   Last Refilled: 10/30/2009 Received faxed refill request, please advise.  Patient hasn't been seen since 08/2009, no appts scheduled.     Method Requested: Electronic Initial call taken by: Linde Gillis CMA [facility] Dull),  December 03, 2009 4:40 PM  Follow-up for Phone Call        okay to refill for 1 year Have him set up follow up in the next few months Follow-up by: Cindee Salt MD,  December 03, 2009 5:49 PM  Additional Follow-up for Phone Call Additional follow up Details #1::        Rx faxed to pharmacy Additional Follow-up by: DeShannon Smith CMA [facility] Dull),  December 04, 2009 7:55 AM    Prescriptions: FLOMAX 0.4 MG CP24 (TAMSULOSIN HCL) Take 1 capsule by mouth once a day  #30 x 12   Entered by:   Mervin Hack CMA (AAMA)   Authorized by:   Cindee Salt MD   Signed by:   Mervin Hack CMA (AAMA) on 12/04/2009   Method used:   Electronically to        [facility]* (retail)       [address] [place], [place]  [address]       Ph: [phone]       Fax: [phone]   RxID:   [id number]

## 2010-12-08 NOTE — Progress Notes (Signed)
Summary: needs order for skilled nursing  Phone Note Call from Patient Call back at [phone]   Caller: Jonetta Osgood w/ [facility]  Call For: Cindee Salt MD Summary of Call: Needs order for the skilled nursing. She would like to continue to  see him ever other week for the next six weeks. Initial call taken by: Melody Comas,  July 16, 2010 8:43 AM  Follow-up for Phone Call        okay to continue Follow-up by: Cindee Salt MD,  July 16, 2010 9:12 AM  Additional Follow-up for Phone Call Additional follow up Details #1::        called number, voicemail stated "Dayton Martes" I asked that person to return my call, not sure if it's the correct person. DeShannon Smith CMA [facility] Dull)  July 16, 2010 9:37 AM   called the main office for [facility], we had the wrong number for Gwynn Burly, it should be [phone]. I called spoke with her and advised verbal order to continue therapy. Additional Follow-up by: Mervin Hack CMA [facility] Dull),  July 19, 2010 12:09 PM

## 2010-12-08 NOTE — Progress Notes (Signed)
Summary: [facility]  Phone Note Other Incoming   Caller: [facility] Summary of Call:  received fax from [facility] stating that pt is constipated. Per Dr.Letvak pt is to start Miralax take 17grams daily with a full glass of water.  Initial call taken by: Mervin Hack CMA [facility] Dull),  July 14, 2010 9:37 AM  Follow-up for Phone Call        Order faxed back to [facility] and scanned. Follow-up by: Mervin Hack CMA Duncan Dull),  July 14, 2010 9:38 AM    New/Updated Medications: MIRALAX  POWD (POLYETHYLENE GLYCOL 3350) take 17 grams by mouth daily with a full glass of water

## 2010-12-08 NOTE — Miscellaneous (Signed)
Summary: Care Plan/[facility]   Care Plan/[facility]   Imported By: Lanelle Bal 07/20/2010 14:12:25  _____________________________________________________________________  External Attachment:    Type:   Image     Comment:   External Document

## 2010-12-08 NOTE — Assessment & Plan Note (Signed)
Summary: ROA FOR 1 MONTH FOLLOW-UP/JRR   Vital Signs:  Patient profile:   77 year old male Weight:      174 pounds Temp:     98.0 degrees F oral Pulse rate:   64 / minute Pulse rhythm:   regular BP sitting:   140 / 80  (left arm) Cuff size:   large  Vitals Entered By: Mervin Hack CMA [facility] Dull) (July 30, 2010 11:04 AM) CC: 1 month follow-up   History of Present Illness: Feels good now energy levels better No urinary problems Legs not bothering him Doing exercises regularly  diabetes okay hasn't needed any insulin except once since leaving hospital usually <140 fasting No hypoglycemic spells  No cehst pain No SOB  Allergies: 1)  * Penicillin  Past History:  Past medical, surgical, family and social histories (including risk factors) reviewed for relevance to current acute and chronic problems.  Past Medical History: Reviewed history from 01/25/2010 and no changes required. Coronary artery disease-----------------------------------------Dr Callwood Diabetes mellitus, type II with neuropathy GERD Hyperlipidemia Benign prostatic hypertrophy------------------------------------Dr Artis Flock Osteoarthrits Degenerative disk disease---------------------------------------Dr Deeann Saint Hypertension Polymyalgia rheumatica --3/11  Past Surgical History: Reviewed history from 04/20/2007 and no changes required. Cataract extraction glaucoma  1999 1992 angioplasty 1995 esoph dilation  03/1999 laser  o.s. [facility] ) 11/2000 CVA 11/02 myoview  fixed defects  6/05 echo benign  04/2004 gastroenteritis 11/2005 echo ef 58% apical inf dyskinesia  7/07  Family History: Reviewed history and no changes required.  Social History: Reviewed history from 12/11/2007 and no changes required. Retired Widowed 11/ 2008 Never Smoked Alcohol use-no Religion affecting care--involved with Church throughout life (deacon, etc)  Review of Systems       Eating much better weigth  upa couple of pounds sleeping well rash is better --only needed cream twice  Physical Exam  General:  alert.  Looks much better--very bright and normal again Neck:  supple, no masses, no thyromegaly, no carotid bruits, and no cervical lymphadenopathy.   Lungs:  normal respiratory effort, no intercostal retractions, no accessory muscle use, and normal breath sounds.   Heart:  normal rate, regular rhythm, no murmur, and no gallop.  Occ skips Extremities:  no edema Psych:  normally interactive, good eye contact, not anxious appearing, and not depressed appearing.     Impression & Recommendations:  Problem # 1:  CORONARY ARTERY DISEASE (ICD-414.00) Assessment Unchanged  has been quiet will check renal again  His updated medication list for this problem includes:    Plavix 75 Mg Tabs (Clopidogrel bisulfate) .Marland Kitchen... Take 1 tablet by mouth once a day    Verapamil Hcl 40 Mg Tabs (Verapamil hcl) .Marland Kitchen... 1 two times a day    Hydralazine Hcl 50 Mg Tabs (Hydralazine hcl) .Marland Kitchen... Take 1 tablet by mouth four times a day    Nitroquick 0.4 Mg Subl (Nitroglycerin) .Marland Kitchen... As needed for chest pain  Orders: TLB-Renal Function Panel (80069-RENAL) TLB-CBC Platelet - w/Differential (85025-CBCD) Venipuncture (81191)  Problem # 2:  POLYMYALGIA RHEUMATICA (ICD-725) Assessment: Comment Only  seems to be in remission off the prednisone now will recheck ESR  Orders: TLB-Sedimentation Rate (ESR) (85652-ESR)  Problem # 3:  DIABETES MELLITUS, TYPE II, WITH NEUROLOGICAL COMPLICATIONS (ICD-250.60) Assessment: Improved much better now off prednisone and not sick A1c next time  His updated medication list for this problem includes:    Novolog 100 Unit/ml Soln (Insulin aspart) ..... Use three times a day based on coverage    Glipizide 2.5 Mg Xr24h-tab (Glipizide) .Marland KitchenMarland KitchenMarland KitchenMarland Kitchen 1  tab daily before breakfast for diabetes  Problem # 4:  HYPERTENSION (ICD-401.9) Assessment: Unchanged good control  His updated  medication list for this problem includes:    Verapamil Hcl 40 Mg Tabs (Verapamil hcl) .Marland Kitchen... 1 two times a day    Hydralazine Hcl 50 Mg Tabs (Hydralazine hcl) .Marland Kitchen... Take 1 tablet by mouth four times a day  BP today: 140/80 Prior BP: 162/64 (07/05/2010)  Labs Reviewed: K+: 4.5 (07/05/2010) Creat: : 1.6 (07/05/2010)   Chol: 111 (12/28/2009)   HDL: 46.60 (12/28/2009)   LDL: 51 (12/28/2009)   TG: 67.0 (12/28/2009)  Complete Medication List: 1)  Novolog 100 Unit/ml Soln (Insulin aspart) .... Use three times a day based on coverage 2)  Glipizide 2.5 Mg Xr24h-tab (Glipizide) .Marland Kitchen.. 1 tab daily before breakfast for diabetes 3)  Flomax 0.4 Mg Cp24 (Tamsulosin hcl) .... Take 1 capsule by mouth once a day 4)  Plavix 75 Mg Tabs (Clopidogrel bisulfate) .... Take 1 tablet by mouth once a day 5)  Pravastatin Sodium 40 Mg Tabs (Pravastatin sodium) .... Take 1 tablet by mouth once a day 6)  Verapamil Hcl 40 Mg Tabs (Verapamil hcl) .Marland Kitchen.. 1 two times a day 7)  Hydralazine Hcl 50 Mg Tabs (Hydralazine hcl) .... Take 1 tablet by mouth four times a day 8)  Nitroquick 0.4 Mg Subl (Nitroglycerin) .... As needed for chest pain 9)  Combigan 0.2-0.5 % Soln (Brimonidine tartrate-timolol) .... Instill 1 drop in each eye two times a day 10)  Xalatan 0.005 % Soln (Latanoprost) .... Instill 1 drop in each eye at bedtime 11)  Freestyle Lite Test Strp (Glucose blood) .... Patient test blood sugar three times a day 12)  Permethrin 5 % Crea (Permethrin) .... Apply 5% crm x1; info: massage into skin from head to feet, leave on 8-14h, wash off; info: repeat in 14 days if needed 13)  Miralax Powd (Polyethylene glycol 3350) .... Take 17 grams by mouth daily with a full glass of water  Patient Instructions: 1)  Please schedule a follow-up appointment in 3 months .   Current Allergies (reviewed today): * PENICILLIN  Appended Document: ROA FOR 1 MONTH FOLLOW-UP/JRR    Clinical Lists Changes  Orders: Added new Service order  of Flu Vaccine 64yrs + [phone]) - Signed Added new Service order of Admin 1st Vaccine ([address]) - Signed Added new Service order of Admin 1st Vaccine [facility]) [phone]) - Signed Observations: Added new observation of FLU VAX#1VIS: 06/01/10 version given July 30, 2010. (07/30/2010 12:41) Added new observation of FLU VAXLOT: [id number] (07/30/2010 12:41) Added new observation of FLU VAX EXP: 05/07/2011 (07/30/2010 12:41) Added new observation of FLU VAXBY: DeShannon Smith CMA (AAMA) (07/30/2010 12:41) Added new observation of FLU VAXRTE: IM (07/30/2010 12:41) Added new observation of FLU VAX DSE: 0.5 ml (07/30/2010 12:41) Added new observation of FLU VAXMFR: [employer] (07/30/2010 12:41) Added new observation of FLU VAX SITE: left deltoid (07/30/2010 12:41) Added new observation of FLU VAX: Fluvax 3+ (07/30/2010 12:41)       Influenza Vaccine    Vaccine Type: Fluvax 3+    Site: left deltoid    Mfr: [employer]    Dose: 0.5 ml    Route: IM    Given by: Mervin Hack CMA (AAMA)    Exp. Date: 05/07/2011    Lot #: [id number]    VIS given: 06/01/10 version given July 30, 2010.  Flu Vaccine Consent Questions    Do you have a history of severe allergic reactions to this vaccine? no  Any prior history of allergic reactions to egg and/or gelatin? no    Do you have a sensitivity to the preservative Thimersol? no    Do you have a past history of Guillan-Barre Syndrome? no    Do you currently have an acute febrile illness? no    Have you ever had a severe reaction to latex? no    Vaccine information given and explained to patient? yes

## 2010-12-08 NOTE — Letter (Signed)
Summary: Encounter Notice/[facility]  Encounter [facility]   Imported By: Lanelle Bal 07/08/2010 12:23:00  _____________________________________________________________________  External Attachment:    Type:   Image     Comment:   External Document

## 2010-12-08 NOTE — Letter (Signed)
Summary: CMN for [facility]  CMN for [facility]   Imported By: Lanelle Bal 11/20/2009 12:39:22  _____________________________________________________________________  External Attachment:    Type:   Image     Comment:   External Document

## 2010-12-08 NOTE — Progress Notes (Signed)
Summary: [facility] refill  Phone Note Outgoing Call Call back at [facility] Phone [phone]   Call placed by: DeShannon Katrinka Blazing CMA Duncan Dull),  March 01, 2010 10:51 AM Call placed to: Patient Summary of Call: calling pt to see what medications he needs, we received form from pt wanting [facility] refills. Initial call taken by: Mervin Hack CMA [facility] Dull),  March 01, 2010 10:52 AM  Follow-up for Phone Call        Called patient and left message on machine  DeShannon Katrinka Blazing CMA [facility] Dull)  March 01, 2010 10:52 AM  patient called back and he will wait until his next appt and bring all his meds in to be refilled. Follow-up by: Mervin Hack CMA [facility] Dull),  March 01, 2010 11:40 AM

## 2010-12-08 NOTE — Assessment & Plan Note (Signed)
Summary: hospital follow up/[facility]   Vital Signs:  Patient profile:   77 year old male Height:      75 inches Weight:      171.50 pounds BMI:     21.51 Temp:     97.7 degrees F oral Pulse rate:   72 / minute Pulse rhythm:   regular BP sitting:   162 / 64  (left arm) Cuff size:   large  Vitals Entered By: Delilah Shan CMA Duncan Dull) (July 05, 2010 12:53 PM) CC: Hospital follow up - [facility].  Finished the course of Cipro 500 mg..  Discharge summary in EMR.   History of Present Illness: Hospitalized for UTI Both legs were painful and swollen  Urine seems to be cleared up  done with the cipro had been incontinent Did have some burning dysuria  No fever now eating okay  Having therapist start and working on pain in leg  some issues with stablitiy--will hopefully No falls but did fall out of bed during a dream Has nurse coming out also  Has been checking sugars Found to be high in hospital Now 272 this morning May be  ~170s more commonly  No achiness or weakness occ shoulder aching off the prednisone now  Allergies: 1)  * Penicillin  Past History:  Past medical, surgical, family and social histories (including risk factors) reviewed for relevance to current acute and chronic problems.  Past Medical History: Reviewed history from 01/25/2010 and no changes required. Coronary artery disease-----------------------------------------Dr Callwood Diabetes mellitus, type II with neuropathy GERD Hyperlipidemia Benign prostatic hypertrophy------------------------------------Dr Artis Flock Osteoarthrits Degenerative disk disease---------------------------------------Dr Deeann Saint Hypertension Polymyalgia rheumatica --3/11  Past Surgical History: Reviewed history from 04/20/2007 and no changes required. Cataract extraction glaucoma  1999 1992 angioplasty 1995 esoph dilation  03/1999 laser  o.s. [facility] ) 11/2000 CVA 11/02 myoview  fixed defects  6/05 echo  benign  04/2004 gastroenteritis 11/2005 echo ef 58% apical inf dyskinesia  7/07  Family History: Reviewed history and no changes required.  Social History: Reviewed history from 12/11/2007 and no changes required. Retired Widowed 11/ 2008 Never Smoked Alcohol use-no Religion affecting care--involved with Church throughout life (deacon, etc)  Review of Systems       weight down 23 sleeping okay usually Constipated at times---MOM does help when used as needed   Physical Exam  General:  alert and normal appearance.   Neck:  supple, no masses, no thyromegaly, and no cervical lymphadenopathy.   Lungs:  normal respiratory effort, no intercostal retractions, no accessory muscle use, and normal breath sounds.   Heart:  normal rate, regular rhythm, no murmur, and no gallop.   Abdomen:  soft and non-tender.   Msk:  no joint tenderness and no joint swelling.   Neurologic:  walks with cane needs help to get up on examining table Psych:  normally interactive, good eye contact, not anxious appearing, and not depressed appearing.     Impression & Recommendations:  Problem # 1:  UTI (ICD-599.0) Assessment New done with Rx now will retreat if any symptoms recur  Problem # 2:  POLYMYALGIA RHEUMATICA (ICD-725) Assessment: Comment Only off the prednisone since the hospital will look for signs of recurrence sed rate next time  The following medications were removed from the medication list:    Prednisone 10 Mg Tabs (Prednisone) .Marland Kitchen... 1 tab, alternating with 1/2 tab every other day as directed  Problem # 3:  DIABETES MELLITUS, TYPE II, WITH NEUROLOGICAL COMPLICATIONS (ICD-250.60) Assessment: Comment Only  sugars  were high but he was sick last A1c acceptable for his age and situation  His updated medication list for this problem includes:    Novolog 100 Unit/ml Soln (Insulin aspart) ..... Use three times a day based on coverage    Glipizide 2.5 Mg Xr24h-tab (Glipizide) .Marland Kitchen... 1 tab  daily before breakfast for diabetes  Labs Reviewed: Creat: 1.6 (04/27/2010)     Last Eye Exam: No diabetic retinopathy.    (05/24/2010) Reviewed HgBA1c results: 8.4 (04/27/2010)  7.0 (12/28/2009)  Orders: Venipuncture (16109) TLB-Renal Function Panel (80069-RENAL) TLB-CBC Platelet - w/Differential (85025-CBCD)  Problem # 4:  HYPERTENSION (ICD-401.9) Assessment: Unchanged okay off the diuretic will check labs  The following medications were removed from the medication list:    Hydrochlorothiazide 12.5 Mg Caps (Hydrochlorothiazide) .Marland Kitchen... Take one by mouth daily His updated medication list for this problem includes:    Verapamil Hcl 40 Mg Tabs (Verapamil hcl) .Marland Kitchen... 1 two times a day    Hydralazine Hcl 50 Mg Tabs (Hydralazine hcl) .Marland Kitchen... Take 1 tablet by mouth four times a day  BP today: 162/64 Prior BP: 172/82 (05/21/2010)  Labs Reviewed: K+: 4.4 (04/27/2010) Creat: : 1.6 (04/27/2010)   Chol: 111 (12/28/2009)   HDL: 46.60 (12/28/2009)   LDL: 51 (12/28/2009)   TG: 67.0 (12/28/2009)  Complete Medication List: 1)  Novolog 100 Unit/ml Soln (Insulin aspart) .... Use three times a day based on coverage 2)  Glipizide 2.5 Mg Xr24h-tab (Glipizide) .Marland Kitchen.. 1 tab daily before breakfast for diabetes 3)  Flomax 0.4 Mg Cp24 (Tamsulosin hcl) .... Take 1 capsule by mouth once a day 4)  Plavix 75 Mg Tabs (Clopidogrel bisulfate) .... Take 1 tablet by mouth once a day 5)  Nitroquick 0.4 Mg Subl (Nitroglycerin) .... As needed for chest pain 6)  Pravastatin Sodium 40 Mg Tabs (Pravastatin sodium) .... Take 1 tablet by mouth once a day 7)  Verapamil Hcl 40 Mg Tabs (Verapamil hcl) .Marland Kitchen.. 1 two times a day 8)  Hydralazine Hcl 50 Mg Tabs (Hydralazine hcl) .... Take 1 tablet by mouth four times a day 9)  Combigan 0.2-0.5 % Soln (Brimonidine tartrate-timolol) .... Instill 1 drop in each eye two times a day 10)  Xalatan 0.005 % Soln (Latanoprost) .... Instill 1 drop in each eye at bedtime 11)  Freestyle Lite  Test Strp (Glucose blood) .... Patient test blood sugar three times a day 12)  Permethrin 5 % Crea (Permethrin) .... Apply 5% crm x1; info: massage into skin from head to feet, leave on 8-14h, wash off; info: repeat in 14 days if needed  Patient Instructions: 1)  Please schedule a follow-up appointment in 1 month.   Current Allergies (reviewed today): * PENICILLIN

## 2010-12-08 NOTE — Progress Notes (Signed)
Summary: Diabetic Shoes  Phone Note Outgoing Call   Call placed by: Mervin Hack CMA Duncan Dull),  December 15, 2009 10:28 AM Call placed to: Patient Summary of Call: calling pt to see if he is trying to order diabetic shoes. We received a form asking for progress notes and medical records for patient's order of diabetic shoes. Initial call taken by: Mervin Hack CMA Duncan Dull),  December 15, 2009 10:29 AM  Follow-up for Phone Call        left message on machine for patient to return call.  DeShannon Smith CMA Duncan Dull)  December 15, 2009 10:29 AM   patient called back and stated he has been getting diabetic shoes for years and uses that company. Form on your desk  DeShannon Katrinka Blazing CMA Duncan Dull)  December 15, 2009 11:59 AM   Additional Follow-up for Phone Call Additional follow up Details #1::        form done Cindee Salt MD  December 15, 2009 1:53 PM   form faxed to [employer] and scanned Additional Follow-up by: Mervin Hack CMA Duncan Dull),  December 15, 2009 2:07 PM

## 2010-12-08 NOTE — Assessment & Plan Note (Signed)
Summary: 1 MONTH FOLLOW UP/[facility]   Vital Signs:  Patient profile:   77 year old male Weight:      176 pounds Temp:     97.5 degrees F oral Pulse rate:   68 / minute Pulse rhythm:   regular BP sitting:   138 / 60  (left arm) Cuff size:   large  Vitals Entered By: Mervin Hack CMA Duncan Dull) (January 25, 2010 11:32 AM) CC: 1 month follow-up   History of Present Illness: Feels much better Took the 20mg  prednisone for a week and now on 10mg  daily  By 2 days after starting, he noticed a lot of difference able to sleep on shoulder able to lift arms up More strength in legs--able to walk better  sugars have been better has occ needed coverage for sugars over 250 in the past week  Allergies: 1)  * Penicillin  Past History:  Past medical, surgical, family and social histories (including risk factors) reviewed for relevance to current acute and chronic problems.  Past Medical History: Coronary artery disease-----------------------------------------Dr Callwood Diabetes mellitus, type II with neuropathy GERD Hyperlipidemia Benign prostatic hypertrophy------------------------------------Dr Artis Flock Osteoarthrits Degenerative disk disease---------------------------------------Dr Deeann Saint Hypertension Polymyalgia rheumatica --3/11  Past Surgical History: Reviewed history from 04/20/2007 and no changes required. Cataract extraction glaucoma  1999 1992 angioplasty 1995 esoph dilation  03/1999 laser  o.s. [facility] ) 11/2000 CVA 11/02 myoview  fixed defects  6/05 echo benign  04/2004 gastroenteritis 11/2005 echo ef 58% apical inf dyskinesia  7/07  Family History: Reviewed history and no changes required.  Social History: Reviewed history from 12/11/2007 and no changes required. Retired Widowed 11/ 2008 Never Smoked Alcohol use-no Religion affecting care--involved with Church throughout life (deacon, etc)  Review of Systems       eating well weight is down  3# breathing is good Notes some phlegm in throat--no history of allergies No headaches Ongoing vision changes  Physical Exam  General:  alert and normal appearance.   Msk:  no joint tenderness and no joint swelling.   Neurologic:  Gait is slightly wide based but much more stable Stands erect now normal strength in arms Psych:  normally interactive, good eye contact, not anxious appearing, and not depressed appearing.     Impression & Recommendations:  Problem # 1:  POLYMYALGIA RHEUMATICA (ICD-725) Assessment New  diagnosis was considered by me before --but still seemed more arthritic striking response to prednisone confirms the diagnosis will continue prednisone 10mg  daily for now consider very slow wean at next appt check CBC and sed rate  The following medications were removed from the medication list:    Oxaprozin 600 Mg Tabs (Oxaprozin) .Marland Kitchen... Take 1 by mouth once daily His updated medication list for this problem includes:    Adult Aspirin Low Strength 81 Mg Tbdp (Aspirin) .Marland Kitchen... Take 1 tablet by mouth once a day states takes as needed    Prednisone 10 Mg Tabs (Prednisone) .Marland Kitchen... 1 tab daily or as directed  Orders: TLB-CBC Platelet - w/Differential (85025-CBCD) Venipuncture (16109) TLB-Sedimentation Rate (ESR) (85652-ESR)  Complete Medication List: 1)  Flomax 0.4 Mg Cp24 (Tamsulosin hcl) .... Take 1 capsule by mouth once a day 2)  Plavix 75 Mg Tabs (Clopidogrel bisulfate) .... Take 1 tablet by mouth once a day 3)  Nitroquick 0.4 Mg Subl (Nitroglycerin) .... As needed for chest pain 4)  Pravastatin Sodium 40 Mg Tabs (Pravastatin sodium) .... Take 1 tablet by mouth once a day 5)  Adult Aspirin Low Strength 81 Mg Tbdp (  Aspirin) .... Take 1 tablet by mouth once a day states takes as needed 6)  Verapamil Hcl 40 Mg Tabs (Verapamil hcl) .Marland Kitchen.. 1 two times a day 7)  Hydralazine Hcl 50 Mg Tabs (Hydralazine hcl) .... Take 1 tablet by mouth four times a day 8)  Combigan 0.2-0.5 %  Soln (Brimonidine tartrate-timolol) .... Instill 1 drop in each eye two times a day 9)  Xalatan 0.005 % Soln (Latanoprost) .... Instill 1 drop in each eye at bedtime 10)  Glipizide 2.5 Mg Xr24h-tab (Glipizide) .Marland Kitchen.. 1 tab daily before breakfast for diabetes 11)  Hydrochlorothiazide 12.5 Mg Caps (Hydrochlorothiazide) .... Take one by mouth daily 12)  Potassium Chloride Cr 10 Meq Cr-caps (Potassium chloride) .... Take one by mouth daily 13)  Novolog 100 Unit/ml Soln (Insulin aspart) .... Use three times a day based on coverage 14)  Freestyle Lite Test Strp (Glucose blood) .... Patient test blood sugar three times a day 15)  Hydrocodone-acetaminophen 5-325 Mg Tabs (Hydrocodone-acetaminophen) .Marland Kitchen.. 1-2  tabs by mouth three times a day as needed for severe pain 16)  Prednisone 10 Mg Tabs (Prednisone) .Marland Kitchen.. 1 tab daily or as directed  Patient Instructions: 1)  Please schedule a follow-up appointment in 3 months .  Prescriptions: PREDNISONE 10 MG TABS (PREDNISONE) 1 tab daily or as directed  #90 x 3   Entered and Authorized by:   Cindee Salt MD   Signed by:   Cindee Salt MD on 01/25/2010   Method used:   Electronically to        [facility] [address] (retail)       [address]       [place], [place]  [address]       Ph: [phone]       Fax: [phone]   RxID:   [id number]   Current Allergies (reviewed today): * PENICILLIN

## 2010-12-08 NOTE — Assessment & Plan Note (Signed)
Summary: FOLLOW UP / [facility] r/s from 12:00   Vital Signs:  Patient profile:   77 year old male Height:      75 inches Weight:      175.50 pounds BMI:     22.02 Temp:     98.0 degrees F oral Pulse rate:   64 / minute Pulse rhythm:   regular BP sitting:   150 / 80  (right arm) Cuff size:   regular  Vitals Entered By: Linde Gillis CMA (AAMA) (April 27, 2010 10:00 AM) CC: 3 month follow up   History of Present Illness: doing well still no weakness or pain Able to drive, shop, take care of house, etc  checks sugars in AM and occ at other times High in AM if he eats heavy in evening generally under 200 though Does use the novolog if over 250 on sliding scale  No chest pain No SOB does note some nasal discharge and some PND---?allergy not sick  Voids okay Notes much slower if misses flomax no sig nocturia  Mild arthritic pain--esp knees generally hasn't needed meds  Allergies: 1)  * Penicillin  Review of Systems       sleeps okay appetite is good weight is stable  Physical Exam  General:  alert and normal appearance.   Neck:  supple, no masses, no thyromegaly, no carotid bruits, and no cervical lymphadenopathy.   Lungs:  normal respiratory effort and normal breath sounds.   Heart:  normal rate, regular rhythm, no murmur, and no gallop.   Pulses:  1+ in feet Extremities:  no edema Neurologic:  alert & oriented X3 and strength normal in all extremities.   Skin:  no suspicious lesions and no ulcerations.   Psych:  normally interactive, good eye contact, not anxious appearing, and not depressed appearing.    Diabetes Management Exam:    Foot Exam (with socks and/or shoes not present):       Sensory-Pinprick/Light touch:          Left medial foot (L-4): normal          Left dorsal foot (L-5): normal          Left lateral foot (S-1): normal          Right medial foot (L-4): diminished          Right dorsal foot (L-5): diminished          Right lateral foot  (S-1): diminished       Inspection:          Left foot: normal          Right foot: normal       Nails:          Left foot: thickened          Right foot: thickened    Eye Exam:       Eye Exam done elsewhere          Date: 12/08/2009          Results: early retinopathy?          Done by: Dr Marti Sleigh   Impression & Recommendations:  Problem # 1:  POLYMYALGIA RHEUMATICA (ICD-725) Assessment Improved  if ESR is normal, will wean prednisone to 10/5  The following medications were removed from the medication list:    Adult Aspirin Low Strength 81 Mg Tbdp (Aspirin) .Marland Kitchen... Take 1 tablet by mouth once a day states takes as needed His updated medication list for  this problem includes:    Prednisone 10 Mg Tabs (Prednisone) .Marland Kitchen... 1 tab daily or as directed  Orders: TLB-Sedimentation Rate (ESR) (85652-ESR) TLB-Renal Function Panel (80069-RENAL)  Problem # 2:  DIABETES MELLITUS, TYPE II, WITH NEUROLOGICAL COMPLICATIONS (ICD-250.60) Assessment: Unchanged  hopefully still acceptable control despite the prednisone  The following medications were removed from the medication list:    Adult Aspirin Low Strength 81 Mg Tbdp (Aspirin) .Marland Kitchen... Take 1 tablet by mouth once a day states takes as needed His updated medication list for this problem includes:    Novolog 100 Unit/ml Soln (Insulin aspart) ..... Use three times a day based on coverage    Glipizide 2.5 Mg Xr24h-tab (Glipizide) .Marland Kitchen... 1 tab daily before breakfast for diabetes  Labs Reviewed: Creat: 1.8 (12/28/2009)     Last Eye Exam: early retinopathy? (12/08/2009) Reviewed HgBA1c results: 7.0 (12/28/2009)  6.1 (12/16/2008)  Orders: TLB-A1C / Hgb A1C (Glycohemoglobin) (83036-A1C)  Problem # 3:  CORONARY ARTERY DISEASE (ICD-414.00) Assessment: Unchanged fairly quiet no sig restriction in activities  The following medications were removed from the medication list:    Adult Aspirin Low Strength 81 Mg Tbdp (Aspirin) .Marland Kitchen... Take 1 tablet  by mouth once a day states takes as needed His updated medication list for this problem includes:    Plavix 75 Mg Tabs (Clopidogrel bisulfate) .Marland Kitchen... Take 1 tablet by mouth once a day    Nitroquick 0.4 Mg Subl (Nitroglycerin) .Marland Kitchen... As needed for chest pain    Verapamil Hcl 40 Mg Tabs (Verapamil hcl) .Marland Kitchen... 1 two times a day    Hydrochlorothiazide 12.5 Mg Caps (Hydrochlorothiazide) .Marland Kitchen... Take one by mouth daily    Hydralazine Hcl 50 Mg Tabs (Hydralazine hcl) .Marland Kitchen... Take 1 tablet by mouth four times a day  Problem # 4:  HYPERTENSION (ICD-401.9) Assessment: Unchanged  reasonable control  His updated medication list for this problem includes:    Verapamil Hcl 40 Mg Tabs (Verapamil hcl) .Marland Kitchen... 1 two times a day    Hydrochlorothiazide 12.5 Mg Caps (Hydrochlorothiazide) .Marland Kitchen... Take one by mouth daily    Hydralazine Hcl 50 Mg Tabs (Hydralazine hcl) .Marland Kitchen... Take 1 tablet by mouth four times a day  BP today: 150/80 Prior BP: 138/60 (01/25/2010)  Labs Reviewed: K+: 4.4 (12/28/2009) Creat: : 1.8 (12/28/2009)   Chol: 111 (12/28/2009)   HDL: 46.60 (12/28/2009)   LDL: 51 (12/28/2009)   TG: 67.0 (12/28/2009)  Orders: TLB-CBC Platelet - w/Differential (85025-CBCD) TLB-TSH (Thyroid Stimulating Hormone) (84443-TSH) Venipuncture (16109)  Problem # 5:  HYPERLIPIDEMIA (ICD-272.4) Assessment: Unchanged good control no changes needed  His updated medication list for this problem includes:    Pravastatin Sodium 40 Mg Tabs (Pravastatin sodium) .Marland Kitchen... Take 1 tablet by mouth once a day  Labs Reviewed: SGOT: 17 (12/28/2009)   SGPT: 16 (12/28/2009)   HDL:46.60 (12/28/2009), 32.1 (12/16/2008)  LDL:51 (12/28/2009), 54 (12/16/2008)  Chol:111 (12/28/2009), 97 (12/16/2008)  Trig:67.0 (12/28/2009), 55 (12/16/2008)  Problem # 6:  BENIGN PROSTATIC HYPERTROPHY (ICD-600.00) Assessment: Unchanged okay on tamsulosin  Complete Medication List: 1)  Prednisone 10 Mg Tabs (Prednisone) .Marland Kitchen.. 1 tab daily or as directed 2)   Novolog 100 Unit/ml Soln (Insulin aspart) .... Use three times a day based on coverage 3)  Glipizide 2.5 Mg Xr24h-tab (Glipizide) .Marland Kitchen.. 1 tab daily before breakfast for diabetes 4)  Flomax 0.4 Mg Cp24 (Tamsulosin hcl) .... Take 1 capsule by mouth once a day 5)  Plavix 75 Mg Tabs (Clopidogrel bisulfate) .... Take 1 tablet by mouth once a day 6)  Nitroquick 0.4 Mg Subl (Nitroglycerin) .... As needed for chest pain 7)  Pravastatin Sodium 40 Mg Tabs (Pravastatin sodium) .... Take 1 tablet by mouth once a day 8)  Verapamil Hcl 40 Mg Tabs (Verapamil hcl) .Marland Kitchen.. 1 two times a day 9)  Hydrochlorothiazide 12.5 Mg Caps (Hydrochlorothiazide) .... Take one by mouth daily 10)  Hydralazine Hcl 50 Mg Tabs (Hydralazine hcl) .... Take 1 tablet by mouth four times a day 11)  Combigan 0.2-0.5 % Soln (Brimonidine tartrate-timolol) .... Instill 1 drop in each eye two times a day 12)  Xalatan 0.005 % Soln (Latanoprost) .... Instill 1 drop in each eye at bedtime 13)  Potassium Chloride Cr 10 Meq Cr-caps (Potassium chloride) .... Take one by mouth daily 14)  Freestyle Lite Test Strp (Glucose blood) .... Patient test blood sugar three times a day  Patient Instructions: 1)  Please schedule a follow-up appointment in 4 months .  Prescriptions: GLIPIZIDE 2.5 MG XR24H-TAB (GLIPIZIDE) 1 tab daily before breakfast for diabetes  #90 x 3   Entered and Authorized by:   Cindee Salt MD   Signed by:   Cindee Salt MD on 04/27/2010   Method used:   Electronically to        [facility] [address] (retail)       [address]       [place], [place]  [address]       Ph: [phone]       Fax: [phone]   RxID:   [id number]   Current Allergies (reviewed today): * PENICILLIN

## 2010-12-08 NOTE — Progress Notes (Signed)
Summary: still with pain in shoulders  Phone Note Call from Patient Call back at Home Phone [phone]   Caller: Patient Call For: Cindee Salt MD Summary of Call: Pt says the pain in his shoulders is not getting any better, he is taking his pain med like you told him to.  What else to do?  Please advise.  [facility]. Initial call taken by: Lowella Petties CMA,  January 07, 2010 1:18 PM  Follow-up for Phone Call        seemed to get relief in the first couple of days from the hydrocodone but has waned no side effects not really myalgias and only on top of shoulder  P: can increase to 2 tabs as needed     will likely need refill soon if taking that much but he doesn't need yet Needs recheck if that isn't working Follow-up by: Cindee Salt MD,  January 07, 2010 5:24 PM    New/Updated Medications: HYDROCODONE-ACETAMINOPHEN 5-325 MG TABS (HYDROCODONE-ACETAMINOPHEN) 1-2  tabs by mouth three times a day as needed for severe pain

## 2010-12-08 NOTE — Miscellaneous (Signed)
Summary: Order/[facility]  Order/[facility]   Imported By: Lester Omaha 07/26/2010 09:40:16  _____________________________________________________________________  External Attachment:    Type:   Image     Comment:   External Document

## 2010-12-08 NOTE — Medication Information (Signed)
Summary: Diabetes Supplies/[facility]  Diabetes Supplies/[facility]   Imported By: Lanelle Bal 12/01/2009 08:28:15  _____________________________________________________________________  External Attachment:    Type:   Image     Comment:   External Document

## 2010-12-08 NOTE — Letter (Signed)
Summary: [facility]   Imported By: Lanelle Bal 06/02/2010 13:15:17  _____________________________________________________________________  External Attachment:    Type:   Image     Comment:   External Document  Appended Document: [facility]    Clinical Lists Changes  Observations: Added new observation of DIAB EYE EX: No diabetic retinopathy.    (05/24/2010 20:04)       Diabetic Eye Exam  Procedure date:  05/24/2010  Findings:      No diabetic retinopathy.

## 2010-12-08 NOTE — Assessment & Plan Note (Signed)
Summary: rash//rbh   Vital Signs:  Patient profile:   77 year old male Height:      75 inches (190.50 cm) Weight:      173.25 pounds (78.75 kg) BMI:     21.73 Temp:     97.6 degrees F (36.44 degrees C) oral Pulse rate:   72 / minute Pulse rhythm:   regular BP sitting:   172 / 82  (left arm) Cuff size:   regular  Vitals Entered By: Delilah Shan CMA Derald Lorge Dull) (May 21, 2010 10:30 AM) CC: WALK-IN   Rash - itching (Since June 21st)   History of Present Illness: Itching started in June.  Scratching "all over".  Worse at night.  No trigger known.  "Worst itching I've had in my life."  No change in meds.   Already on prednisone for PMR.  No help with otc topicals.  "I have these bumps that come up and then I scratch them and then it keeps itching." Worse on arms and legs.  Started on chest.   Allergies: 1)  * Penicillin  Review of Systems       See HPI.  Otherwise noncontributory.    Physical Exam  General:  GEN: nad, alert and oriented HEENT: mucous membranes moist NECK: supple w/o LA CV: rrr.   PULM: ctab, no inc wob EXT: no edema SKIN: excoriated patches along arms and legs.  No new bullous lesions.     Impression & Recommendations:  Problem # 1:  SCABIES (ICD-133.0) Likely scabies.  I showed the patient a picture of typical early scabies lesions and "that's what they looked like before I started scratching."  Already on by mouth prednisone w/o relief of itching.  d/w patient re: typical course with permethrin and to wash his clothes and sheets.  follow up as needed.  He agrees.   Complete Medication List: 1)  Prednisone 10 Mg Tabs (Prednisone) .Marland Kitchen.. 1 tab, alternating with 1/2 tab every other day as directed 2)  Novolog 100 Unit/ml Soln (Insulin aspart) .... Use three times a day based on coverage 3)  Glipizide 2.5 Mg Xr24h-tab (Glipizide) .Marland Kitchen.. 1 tab daily before breakfast for diabetes 4)  Flomax 0.4 Mg Cp24 (Tamsulosin hcl) .... Take 1 capsule by mouth once a day 5)   Plavix 75 Mg Tabs (Clopidogrel bisulfate) .... Take 1 tablet by mouth once a day 6)  Nitroquick 0.4 Mg Subl (Nitroglycerin) .... As needed for chest pain 7)  Pravastatin Sodium 40 Mg Tabs (Pravastatin sodium) .... Take 1 tablet by mouth once a day 8)  Verapamil Hcl 40 Mg Tabs (Verapamil hcl) .Marland Kitchen.. 1 two times a day 9)  Hydrochlorothiazide 12.5 Mg Caps (Hydrochlorothiazide) .... Take one by mouth daily 10)  Hydralazine Hcl 50 Mg Tabs (Hydralazine hcl) .... Take 1 tablet by mouth four times a day 11)  Combigan 0.2-0.5 % Soln (Brimonidine tartrate-timolol) .... Instill 1 drop in each eye two times a day 12)  Xalatan 0.005 % Soln (Latanoprost) .... Instill 1 drop in each eye at bedtime 13)  Potassium Chloride Cr 10 Meq Cr-caps (Potassium chloride) .... Take one by mouth daily 14)  Freestyle Lite Test Strp (Glucose blood) .... Patient test blood sugar three times a day 15)  Permethrin 5 % Crea (Permethrin) .... Apply 5% crm x1; info: massage into skin from head to feet, leave on 8-14h, wash off; info: repeat in 14 days if needed  Patient Instructions: 1)  Please schedule a follow-up appointment as needed . Use the  cream and let us know if you aren't getting better.  It may take a few days after the treatment to see a difference. Prescriptions: PERMETHRIN 5 % CREA (PERMETHRIN) apply 5% crm x1; Info: massage into skin from head to feet, leave on 8-14h, wash off; Info: repeat in 14 days if needed  #60g x 1   Entered and Authorized by:   Crawford Givens MD   Signed by:   Crawford Givens MD on 05/21/2010   Method used:   Faxed to ...       [facility] (mail-order)       [address] [place], [place]  [address]       Ph: [phone]       Fax: [phone]   RxID:   [id number] PERMETHRIN 5 % CREA (PERMETHRIN) apply 5% crm x1; Info: massage into skin from head to feet, leave on 8-14h, wash off; Info: repeat in 14 days if needed  #60g x 1   Entered and Authorized  by:   Crawford Givens MD   Signed by:   Crawford Givens MD on 05/21/2010   Method used:   Print then Give to Patient   RxID:   [id number]   Current Allergies (reviewed today): * PENICILLIN

## 2010-12-08 NOTE — Miscellaneous (Signed)
Summary: Pt Care Update/[facility]  Pt Care Update/[facility]   Imported By: Sherian Rein 07/21/2010 07:49:00  _____________________________________________________________________  External Attachment:    Type:   Image     Comment:   External Document

## 2010-12-08 NOTE — Assessment & Plan Note (Signed)
Summary: WALK IN VISIT FOR WHOLE BODY ACHES AND CAN'T SLEEP/Evan [name]   Vital Signs:  Patient profile:   77 year old male Weight:      179 pounds Temp:     98.1 degrees F oral BP sitting:   148 / 70  (left arm) Cuff size:   large  Vitals Entered By: Mervin Hack CMA Duncan Dull) (December 28, 2009 10:22 AM) CC: pain all over   History of Present Illness: Having body aches Both shoulders and legs are hurting some down his legs also Went to [facility] urgent care and got shot in right hip---didn't help Left elbow is very sore  No fever No cough Mild DOE---no change from baseline  No chest pain--just at the shoulder  Tried tylenol 650mg --helped at first but now not helping  Living alone  Drives local but not to here  sugars have been okay no hypoglycemia generally hasn't needed insulin  Allergies: 1)  * Penicillin  Past History:  Past medical, surgical, family and social histories (including risk factors) reviewed for relevance to current acute and chronic problems.  Past Medical History: Reviewed history from 08/28/2008 and no changes required. Coronary artery disease Diabetes mellitus, type II with neuropathy GERD Hyperlipidemia Benign prostatic hypertrophy Osteoarthrits Degenerative disk disease Hypertension  [facility]   [phone] Dr Emilie Rutter  [phone] Dr Olena Mater  Past Surgical History: Reviewed history from 04/20/2007 and no changes required. Cataract extraction glaucoma  1999 1992 angioplasty 1995 esoph dilation  03/1999 laser  [facility] ) 11/2000 CVA 11/02 myoview  fixed defects  6/05 echo benign  04/2004 gastroenteritis 11/2005 echo ef 58% apical inf dyskinesia  7/07  Family History: Reviewed history and no changes required.  Social History: Reviewed history from 12/11/2007 and no changes required. Retired Widowed 11/ 2008 Never Smoked Alcohol use-no Religion affecting care--involved with Church throughout  life (deacon, etc)  Review of Systems       Not sleeping over the past couple of nights due to shoulder pain sig AM stiffness---loosens up after being up for a while Weight up 14# since last visit--generally eating better No new meds or supplements  Physical Exam  General:  alert.  NAD Neck:  supple, no masses, no thyromegaly, and no cervical lymphadenopathy.   Lungs:  normal respiratory effort and normal breath sounds.   Heart:  normal rate, regular rhythm, no murmur, and no gallop.   Msk:  very stiff Needs help getting on table no sig joint swelling marked restriction of rotation of left shoulder and moderate on right Elbows and knees benign Neurologic:  slow gait without ataxia no focal weakness Psych:  normally interactive, good eye contact, not anxious appearing, and not depressed appearing.     Impression & Recommendations:  Problem # 1:  OSTEOARTHRITIS (ICD-715.90) Assessment Deteriorated no acute joint findings to suggest systemic disease or allergic reaction will try hydrocodone again  His updated medication list for this problem includes:    Adult Aspirin Low Strength 81 Mg Tbdp (Aspirin) .Marland Kitchen... Take 1 tablet by mouth once a day states takes as needed    Oxaprozin 600 Mg Tabs (Oxaprozin) .Marland Kitchen... Take 1 by mouth once daily    Hydrocodone-acetaminophen 5-325 Mg Tabs (Hydrocodone-acetaminophen) .Marland Kitchen... 1 tab by mouth three times a day as needed for severe pain  Problem # 2:  DIABETES MELLITUS, TYPE II, WITH NEUROLOGICAL COMPLICATIONS (ICD-250.60) Assessment: Unchanged  seems to be okay will check labs  His updated medication list for this problem includes:  Adult Aspirin Low Strength 81 Mg Tbdp (Aspirin) .Marland Kitchen... Take 1 tablet by mouth once a day states takes as needed    Glipizide 2.5 Mg Xr24h-tab (Glipizide) .Marland Kitchen... 1 tab daily before breakfast for diabetes    Novolog 100 Unit/ml Soln (Insulin aspart) ..... Use three times a day based on coverage  Labs  Reviewed: Creat: 1.3 (12/16/2008)     Last Eye Exam: normal (05/07/2008) Reviewed HgBA1c results: 6.1 (12/16/2008)  6.8 (08/15/2008)  Orders: TLB-A1C / Hgb A1C (Glycohemoglobin) (83036-A1C) Venipuncture ([address])  Problem # 3:  CORONARY ARTERY DISEASE (ICD-414.00) Assessment: Unchanged seems to be controlled no changes  His updated medication list for this problem includes:    Plavix 75 Mg Tabs (Clopidogrel bisulfate) .Marland Kitchen... Take 1 tablet by mouth once a day    Nitroquick 0.4 Mg Subl (Nitroglycerin) .Marland Kitchen... As needed for chest pain    Adult Aspirin Low Strength 81 Mg Tbdp (Aspirin) .Marland Kitchen... Take 1 tablet by mouth once a day states takes as needed    Verapamil Hcl 40 Mg Tabs (Verapamil hcl) .Marland Kitchen... 1 two times a day    Hydralazine Hcl 50 Mg Tabs (Hydralazine hcl) .Marland Kitchen... Take 1 tablet by mouth four times a day    Hydrochlorothiazide 12.5 Mg Caps (Hydrochlorothiazide) .Marland Kitchen... Take one by mouth daily  Complete Medication List: 1)  Flomax 0.4 Mg Cp24 (Tamsulosin hcl) .... Take 1 capsule by mouth once a day 2)  Plavix 75 Mg Tabs (Clopidogrel bisulfate) .... Take 1 tablet by mouth once a day 3)  Nitroquick 0.4 Mg Subl (Nitroglycerin) .... As needed for chest pain 4)  Pravastatin Sodium 40 Mg Tabs (Pravastatin sodium) .... Take 1 tablet by mouth once a day 5)  Adult Aspirin Low Strength 81 Mg Tbdp (Aspirin) .... Take 1 tablet by mouth once a day states takes as needed 6)  Verapamil Hcl 40 Mg Tabs (Verapamil hcl) .Marland Kitchen.. 1 two times a day 7)  Hydralazine Hcl 50 Mg Tabs (Hydralazine hcl) .... Take 1 tablet by mouth four times a day 8)  Combigan 0.2-0.5 % Soln (Brimonidine tartrate-timolol) .... Instill 1 drop in each eye two times a day 9)  Oxaprozin 600 Mg Tabs (Oxaprozin) .... Take 1 by mouth once daily 10)  Xalatan 0.005 % Soln (Latanoprost) .... Instill 1 drop in each eye at bedtime 11)  Glipizide 2.5 Mg Xr24h-tab (Glipizide) .Marland Kitchen.. 1 tab daily before breakfast for diabetes 12)  Hydrochlorothiazide 12.5  Mg Caps (Hydrochlorothiazide) .... Take one by mouth daily 13)  Potassium Chloride Cr 10 Meq Cr-caps (Potassium chloride) .... Take one by mouth daily 14)  Novolog 100 Unit/ml Soln (Insulin aspart) .... Use three times a day based on coverage 15)  Freestyle Lite Test Strp (Glucose blood) .... Patient test blood sugar three times a day 16)  Hydrocodone-acetaminophen 5-325 Mg Tabs (Hydrocodone-acetaminophen) .Marland Kitchen.. 1 tab by mouth three times a day as needed for severe pain  Other Orders: TLB-Renal Function Panel (80069-RENAL) TLB-CBC Platelet - w/Differential (85025-CBCD) TLB-TSH (Thyroid Stimulating Hormone) (84443-TSH) TLB-Lipid Panel (80061-LIPID) TLB-Hepatic/Liver Function Pnl (80076-HEPATIC)  Patient Instructions: 1)  Please schedule a follow-up appointment in 1 month.  Prescriptions: HYDROCODONE-ACETAMINOPHEN 5-325 MG TABS (HYDROCODONE-ACETAMINOPHEN) 1 tab by mouth three times a day as needed for severe pain  #90 x 0   Entered and Authorized by:   Cindee Salt MD   Signed by:   Cindee Salt MD on 12/28/2009   Method used:   Print then Give to Patient   RxID:   [id number]  Current Allergies (reviewed today): * PENICILLIN

## 2010-12-08 NOTE — Progress Notes (Signed)
Summary: Testing supplies  Phone Note From Pharmacy   Caller: [facility] Call For: Dr. Alphonsus Sias  Summary of Call: Received faxed form requesting testing supplies.  Form in your IN box, please advise Initial call taken by: Linde Gillis CMA Duncan Dull),  November 19, 2009 8:39 AM  Follow-up for Phone Call        pt states he does not want to use this company, he will ask his other company to send [place] forms. I called [facility] to ask if pt gets his diabetic supplies there, the pharmacist states pt has rx's on hold but has never filled them.  Follow-up by: Mervin Hack CMA Duncan Dull),  November 19, 2009 3:26 PM

## 2010-12-08 NOTE — Medication Information (Signed)
Summary: Therapeutic [facility]  [facility]   Imported By: Lanelle Bal 12/17/2009 14:52:45  _____________________________________________________________________  External Attachment:    Type:   Image     Comment:   External Document

## 2010-12-08 NOTE — Progress Notes (Signed)
  Phone Note Call from Patient Call back at Home Phone [phone]   Caller: Patient Summary of Call: He called my home on 3/5 and I returned it the next day (I was out of town) Aching much worse---shoulders more than hips/legs Hard to even get out of bed  I do have some suspicion for PMR Prednisone started has follow up on 3/21 Initial call taken by: Cindee Salt MD,  January 13, 2010 11:19 AM    New/Updated Medications: PREDNISONE 10 MG TABS (PREDNISONE) 2 tabs daily for 1 week, then 1 daily Prescriptions: PREDNISONE 10 MG TABS (PREDNISONE) 2 tabs daily for 1 week, then 1 daily  #100 x 0   Entered and Authorized by:   Cindee Salt MD   Signed by:   Cindee Salt MD on 01/13/2010   Method used:   Telephoned to ...       [facility] (mail-order)       [address] [place], [place]  [address]       Ph: [phone]       Fax: [phone]   RxID:   [id number]

## 2010-12-08 NOTE — Progress Notes (Signed)
Summary: Triage Call...  Phone Note Call from Patient   Caller: Patient Summary of Call: Triage Call: Pt called, was in the office on last week, says the area of concern is  no better, still red, no pain... The medication (Peremetrin 5mg ) does not seen to be working.[facility] [place] [phone] Pt call back # [phone] Initial call taken by: Daine Gip,  May 24, 2010 4:08 PM  Follow-up for Phone Call        The rash and itching can last a few days (up to a week) after the first treatment.  I want him to give it another day or two.  If it isn't getting better, we may have to get him back in the office.  I'll talk with his primary in the meantime.  Please send the note back to me after you talk with the patient.  Follow-up by: Crawford Givens MD,  May 24, 2010 4:52 PM  Additional Follow-up for Phone Call Additional follow up Details #1::        Patient Advised.  Lugene Fuquay CMA Duncan Dull)  May 24, 2010 4:57 PM     Additional Follow-up for Phone Call Additional follow up Details #2::    noted.  I have d/w Dr. Alphonsus Sias.  Will consider increase in prednisone if not better in a few days.  Follow-up by: Crawford Givens MD,  May 24, 2010 5:15 PM

## 2010-12-09 NOTE — Progress Notes (Signed)
Summary: finished prednisone  Phone Note Call from Patient Call back at Home Phone [phone]   Caller: Patient Call For: Cindee Salt MD Summary of Call: Pt states that he has finished his prednisone and is asking what he should do now.  Should he continue his prednisone?  He says he feels pretty good.  [facility]. Initial call taken by: Lowella Petties CMA, AAMA,  November 16, 2010 4:55 PM  Follow-up for Phone Call        He needs to continue the prednisone at 10mg  daily till next month's appt at least I think he should still have refills Follow-up by: Cindee Salt MD,  November 17, 2010 8:41 AM  Additional Follow-up for Phone Call Additional follow up Details #1::        Spoke with patient and advised results, he does have refills, confirmed with [facility]. DeShannon Smith CMA (AAMA)  November 17, 2010 2:45 PM     New/Updated Medications: PREDNISONE 10 MG TABS (PREDNISONE) take 1 by mouth once daily

## 2010-12-09 NOTE — Medication Information (Signed)
Summary: Rx for Diabetic Shoes  Rx for Diabetic Shoes   Imported By: Maryln Gottron 11/30/2010 14:59:19  _____________________________________________________________________  External Attachment:    Type:   Image     Comment:   External Document

## 2010-12-09 NOTE — Progress Notes (Signed)
Summary: form for diabetic shoes  Phone Note From Pharmacy   Caller: [facility] Summary of Call: Form for diabetic shoes is on your desk.  They are asking for dx, which I added, and chart notes.               Lowella Petties CMA, AAMA  November 29, 2010 9:41 AM   Follow-up for Phone Call        thanks Follow-up by: Cindee Salt MD,  November 29, 2010 1:57 PM  Additional Follow-up for Phone Call Additional follow up Details #1::        form faxed and scanned Additional Follow-up by: DeShannon Katrinka Blazing CMA Duncan Dull),  November 29, 2010 2:12 PM

## 2010-12-15 NOTE — Letter (Signed)
Summary: [facility]  [facility]   Imported By: Sherian Rein 12/08/2010 08:11:34  _____________________________________________________________________  External Attachment:    Type:   Image     Comment:   External Document

## 2010-12-15 NOTE — Letter (Signed)
Summary: [facility]  [facility]   Imported By: Sherian Rein 12/08/2010 08:25:13  _____________________________________________________________________  External Attachment:    Type:   Image     Comment:   External Document

## 2010-12-27 ENCOUNTER — Other Ambulatory Visit: Payer: Self-pay | Admitting: Internal Medicine

## 2010-12-27 ENCOUNTER — Encounter: Payer: Self-pay | Admitting: Internal Medicine

## 2010-12-27 ENCOUNTER — Ambulatory Visit (INDEPENDENT_AMBULATORY_CARE_PROVIDER_SITE_OTHER): Payer: MEDICARE | Admitting: Internal Medicine

## 2010-12-27 DIAGNOSIS — M353 Polymyalgia rheumatica: Secondary | ICD-10-CM

## 2010-12-27 DIAGNOSIS — N400 Enlarged prostate without lower urinary tract symptoms: Secondary | ICD-10-CM

## 2010-12-27 DIAGNOSIS — I10 Essential (primary) hypertension: Secondary | ICD-10-CM

## 2010-12-27 LAB — CBC WITH DIFFERENTIAL/PLATELET
Basophils Relative: 0.4 % (ref 0.0–3.0)
Lymphocytes Relative: 14.6 % (ref 12.0–46.0)
Lymphs Abs: 1.3 10*3/uL (ref 0.7–4.0)
MCV: 89.9 fl (ref 78.0–100.0)
Neutrophils Relative %: 73.9 % (ref 43.0–77.0)
WBC: 8.6 10*3/uL (ref 4.5–10.5)

## 2010-12-27 LAB — SEDIMENTATION RATE: Sed Rate: 14 mm/hr (ref 0–22)

## 2010-12-27 LAB — RENAL FUNCTION PANEL
CO2: 33 mEq/L — ABNORMAL HIGH (ref 19–32)
Calcium: 9.2 mg/dL (ref 8.4–10.5)
Chloride: 103 mEq/L (ref 96–112)
Creatinine, Ser: 1.5 mg/dL (ref 0.4–1.5)
Glucose, Bld: 227 mg/dL — ABNORMAL HIGH (ref 70–99)
Phosphorus: 3.1 mg/dL (ref 2.3–4.6)
Potassium: 4.9 mEq/L (ref 3.5–5.1)
Sodium: 141 mEq/L (ref 135–145)

## 2011-01-04 NOTE — Assessment & Plan Note (Signed)
Summary: 3 MONTH FOLLOW UP   Vital Signs:  Patient profile:   77 year old male Weight:      177 pounds Temp:     97.6 degrees F oral Pulse rate:   63 / minute Pulse rhythm:   regular BP sitting:   180 / 83  (left arm) Cuff size:   large  Vitals Entered By: Mervin Hack CMA (AAMA) (December 27, 2010 11:22 AM)  Serial Vital Signs/Assessments:  Time      Position  BP       Pulse  Resp  Temp     By           R Arm     178/80                         Cindee Salt MD  CC: follow-up   History of Present Illness: DOing okay Energy levels fair--still falls asleep easy Achiness is gone  Did have spell of constipation No help with fleet's enema Then took MOM with some effect Bowels more regular with miralax --using every other day now  No leg swelling with hose No sig SOB No chest pain No headaches  Back pain is okay uses the pain meds only occ  does void a lot Both day and night  Able to drive here again  Allergies: 1)  * Penicillin  Past History:  Past medical, surgical, family and social histories (including risk factors) reviewed for relevance to current acute and chronic problems.  Past Medical History: Reviewed history from 01/25/2010 and no changes required. Coronary artery disease-----------------------------------------Dr Callwood Diabetes mellitus, type II with neuropathy GERD Hyperlipidemia Benign prostatic hypertrophy------------------------------------Dr Artis Flock Osteoarthrits Degenerative disk disease---------------------------------------Dr Deeann Saint Hypertension Polymyalgia rheumatica --3/11  Past Surgical History: Reviewed history from 04/20/2007 and no changes required. Cataract extraction glaucoma  1999 1992 angioplasty 1995 esoph dilation  03/1999 laser  [facility] ) 11/2000 CVA 11/02 myoview  fixed defects  6/05 echo benign  04/2004 gastroenteritis 11/2005 echo ef 58% apical inf dyskinesia  7/07  Family  History: Reviewed history and no changes required.  Social History: Reviewed history from 12/11/2007 and no changes required. Retired Widowed 11/ 2008 Never Smoked Alcohol use-no Religion affecting care--involved with Church throughout life (deacon, etc)  Review of Systems       Sleeps well at night wears support socks---has helped his feet weight is up a few pounds---appetite is good  Physical Exam  General:  alert and normal appearance.   Neck:  supple, no masses, no thyromegaly, and no cervical lymphadenopathy.   Lungs:  normal respiratory effort, no intercostal retractions, no accessory muscle use, and normal breath sounds.   Heart:  normal rate, regular rhythm, no murmur, and no gallop.   Extremities:  no edema Psych:  normally interactive, good eye contact, not anxious appearing, and not depressed appearing.     Impression & Recommendations:  Problem # 1:  POLYMYALGIA RHEUMATICA (ICD-725) Assessment Improved  better  will try slow wean again  His updated medication list for this problem includes:    Prednisone 10 Mg Tabs (Prednisone) .Marland Kitchen... Take 1 by mouth once daily or as directed    Aspirin 81 Mg Tabs (Aspirin) .Marland Kitchen... Take 1 by mouth once daily  Orders: TLB-Renal Function Panel (80069-RENAL) TLB-CBC Platelet - w/Differential (85025-CBCD) Venipuncture (16109) TLB-Sedimentation Rate (ESR) (85652-ESR)  Problem # 2:  HYPERTENSION (ICD-401.9) Assessment: Deteriorated up today but generally has been okay no changes for  now  His updated medication list for this problem includes:    Verapamil Hcl 40 Mg Tabs (Verapamil hcl) .Marland Kitchen... 1 two times a day    Hydralazine Hcl 50 Mg Tabs (Hydralazine hcl) .Marland Kitchen... Take 1 tablet by mouth four times a day  BP today: 180/83 Prior BP: 120/60 (09/24/2010)  Labs Reviewed: K+: 4.7 (09/24/2010) Creat: : 1.7 (09/24/2010)   Chol: 111 (12/28/2009)   HDL: 46.60 (12/28/2009)   LDL: 51 (12/28/2009)   TG: 67.0 (12/28/2009)  Problem # 3:   BENIGN PROSTATIC HYPERTROPHY (ICD-600.00) Assessment: Deteriorated voiding more may be due to fluid retention with the prednisone doing okay with support hose will wean the prednisone  Complete Medication List: 1)  Prednisone 10 Mg Tabs (Prednisone) .... Take 1 by mouth once daily or as directed 2)  Novolog 100 Unit/ml Soln (Insulin aspart) .... Use three times a day based on coverage 3)  Glipizide 2.5 Mg Xr24h-tab (Glipizide) .Marland Kitchen.. 1 tab daily before breakfast for diabetes 4)  Flomax 0.4 Mg Cp24 (Tamsulosin hcl) .... Take 1 capsule by mouth once a day 5)  Plavix 75 Mg Tabs (Clopidogrel bisulfate) .... Take 1 tablet by mouth once a day 6)  Pravastatin Sodium 40 Mg Tabs (Pravastatin sodium) .... Take 1 tablet by mouth once a day 7)  Verapamil Hcl 40 Mg Tabs (Verapamil hcl) .Marland Kitchen.. 1 two times a day 8)  Hydralazine Hcl 50 Mg Tabs (Hydralazine hcl) .... Take 1 tablet by mouth four times a day 9)  Nitroquick 0.4 Mg Subl (Nitroglycerin) .... As needed for chest pain 10)  Combigan 0.2-0.5 % Soln (Brimonidine tartrate-timolol) .... Instill 1 drop in each eye two times a day 11)  Xalatan 0.005 % Soln (Latanoprost) .... Instill 1 drop in each eye at bedtime 12)  Freestyle Lite Test Strp (Glucose blood) .... Patient test blood sugar three times a day 13)  Miralax Powd (Polyethylene glycol 3350) .... Take 17 grams by mouth daily with a full glass of water 14)  Aspirin 81 Mg Tabs (Aspirin) .... Take 1 by mouth once daily  Patient Instructions: 1)  Please decrease the prednisone to 10mg  on even days and 5 mg (1/2 of 10mg  tab) on odd days 2)  Please schedule a follow-up appointment in 3 months .  Prescriptions: HYDRALAZINE HCL 50 MG TABS (HYDRALAZINE HCL) Take 1 tablet by mouth four times a day  #120 x 11   Entered by:   Mervin Hack CMA (AAMA)   Authorized by:   Cindee Salt MD   Signed by:   Mervin Hack CMA (AAMA) on 12/27/2010   Method used:   Electronically to        [facility]*  (retail)       [address] [place], [place]  [address]       Ph: [phone]       Fax: [phone]   RxID:   [id number] VERAPAMIL HCL 40 MG TABS (VERAPAMIL HCL) 1 two times a day  #60 x 12   Entered by:   Mervin Hack CMA (AAMA)   Authorized by:   Cindee Salt MD   Signed by:   Mervin Hack CMA (AAMA) on 12/27/2010   Method used:   Electronically to        [facility]* (retail)       [address] [place], [place]  [address]  Ph: [phone]       Fax: [phone]   RxID:   [id number] FLOMAX 0.4 MG CP24 (TAMSULOSIN HCL) Take 1 capsule by mouth once a day  #30 x 12   Entered by:   Mervin Hack CMA (AAMA)   Authorized by:   Cindee Salt MD   Signed by:   Mervin Hack CMA (AAMA) on 12/27/2010   Method used:   Electronically to        [facility]* (retail)       [address] [place], [place]  [address]       Ph: [phone]       Fax: [phone]   RxID:   [id number]    Orders Added: 1)  Est. Patient Level IV [22025] 2)  TLB-Renal Function Panel [80069-RENAL] 3)  TLB-CBC Platelet - w/Differential [85025-CBCD] 4)  Venipuncture [42706] 5)  TLB-Sedimentation Rate (ESR) [23762-GBT]    Current Allergies (reviewed today): * PENICILLIN

## 2011-01-18 ENCOUNTER — Telehealth: Payer: Self-pay | Admitting: Internal Medicine

## 2011-01-21 LAB — URINE CULTURE
Colony Count: >=100000
Special Requests: NEGATIVE

## 2011-01-21 LAB — CBC
HCT: 36.7 % — ABNORMAL LOW (ref 39.0–52.0)
HCT: 37.2 % — ABNORMAL LOW (ref 39.0–52.0)
MCH: 30.2 pg (ref 26.0–34.0)
MCV: 89.4 fL (ref 78.0–100.0)
MCV: 90.2 fL (ref 78.0–100.0)
MCV: 90.5 fL (ref 78.0–100.0)
Platelets: 136 10*3/uL — ABNORMAL LOW (ref 150–400)
RBC: 3.93 MIL/uL — ABNORMAL LOW (ref 4.22–5.81)
RBC: 4.06 MIL/uL — ABNORMAL LOW (ref 4.22–5.81)
WBC: 15.7 10*3/uL — ABNORMAL HIGH (ref 4.0–10.5)
WBC: 23.4 10*3/uL — ABNORMAL HIGH (ref 4.0–10.5)

## 2011-01-21 LAB — DIFFERENTIAL
Basophils Absolute: 0 10*3/uL (ref 0.0–0.1)
Basophils Absolute: 0.2 10*3/uL — ABNORMAL HIGH (ref 0.0–0.1)
Basophils Relative: 0 % (ref 0–1)
Eosinophils Relative: 0 % (ref 0–5)
Lymphocytes Relative: 4 % — ABNORMAL LOW (ref 12–46)
Lymphocytes Relative: 5 % — ABNORMAL LOW (ref 12–46)
Lymphs Abs: 0.7 10*3/uL (ref 0.7–4.0)
Lymphs Abs: 0.8 10*3/uL (ref 0.7–4.0)
Monocytes Absolute: 0.7 10*3/uL (ref 0.1–1.0)
Neutro Abs: 14.1 10*3/uL — ABNORMAL HIGH (ref 1.7–7.7)
Neutro Abs: 14.5 10*3/uL — ABNORMAL HIGH (ref 1.7–7.7)
Neutro Abs: 21.9 10*3/uL — ABNORMAL HIGH (ref 1.7–7.7)
Neutrophils Relative %: 90 % — ABNORMAL HIGH (ref 43–77)
Neutrophils Relative %: 94 % — ABNORMAL HIGH (ref 43–77)

## 2011-01-21 LAB — COMPREHENSIVE METABOLIC PANEL
ALT: 11 U/L (ref 0–53)
Alkaline Phosphatase: 62 U/L (ref 39–117)
Alkaline Phosphatase: 68 U/L (ref 39–117)
BUN: 22 mg/dL (ref 6–23)
BUN: 23 mg/dL (ref 6–23)
CO2: 27 mEq/L (ref 19–32)
Chloride: 101 mEq/L (ref 96–112)
Chloride: 103 mEq/L (ref 96–112)
Creatinine, Ser: 1.39 mg/dL (ref 0.4–1.5)
GFR calc non Af Amer: 46 mL/min — ABNORMAL LOW (ref >60)
Glucose, Bld: 206 mg/dL — ABNORMAL HIGH (ref 70–99)
Glucose, Bld: 321 mg/dL — ABNORMAL HIGH (ref 70–99)
Potassium: 3.7 mEq/L (ref 3.5–5.1)
Potassium: 4.4 mEq/L (ref 3.5–5.1)
Total Bilirubin: 1.4 mg/dL — ABNORMAL HIGH (ref 0.3–1.2)
Total Bilirubin: 1.9 mg/dL — ABNORMAL HIGH (ref 0.3–1.2)
Total Protein: 7 g/dL (ref 6.0–8.3)

## 2011-01-21 LAB — URINALYSIS, ROUTINE W REFLEX MICROSCOPIC
Glucose, UA: NEGATIVE mg/dL
Nitrite: POSITIVE — AB

## 2011-01-21 LAB — GLUCOSE, CAPILLARY
Glucose-Capillary: 165 mg/dL — ABNORMAL HIGH (ref 70–99)
Glucose-Capillary: 204 mg/dL — ABNORMAL HIGH (ref 70–99)
Glucose-Capillary: 259 mg/dL — ABNORMAL HIGH (ref 70–99)
Glucose-Capillary: 281 mg/dL — ABNORMAL HIGH (ref 70–99)
Glucose-Capillary: 286 mg/dL — ABNORMAL HIGH (ref 70–99)
Glucose-Capillary: 357 mg/dL — ABNORMAL HIGH (ref 70–99)

## 2011-01-21 LAB — BASIC METABOLIC PANEL
Chloride: 104 mEq/L (ref 96–112)
Creatinine, Ser: 1.5 mg/dL (ref 0.4–1.5)
GFR calc Af Amer: 53 mL/min — ABNORMAL LOW (ref >60)

## 2011-01-21 LAB — URINE MICROSCOPIC-ADD ON

## 2011-01-21 LAB — TSH: TSH: 0.792 u[IU]/mL (ref 0.350–4.500)

## 2011-01-21 LAB — HEMOGLOBIN A1C: Mean Plasma Glucose: 203 mg/dL — ABNORMAL HIGH (ref <117)

## 2011-01-25 NOTE — Progress Notes (Signed)
Summary: should pt continue prednisone?  Phone Note Call from Patient Call back at Home Phone [phone] Call back at [phone]   Caller: Patient Call For: Cindee Salt MD Summary of Call: Pt states he is almost out of prednisone, has one left for wednesday,  and is asking if you want him to continue with this, or come off of it. He states he is doing a little better now. Please advise.  [facility]ses [facility] [address]. Initial call taken by: Lowella Petties CMA, AAMA,  January 18, 2011 4:25 PM  Follow-up for Phone Call        Please let him know I want him to stay on 10mg  daily for now send Rx #100x 1 Follow-up by: Cindee Salt MD,  January 19, 2011 9:05 AM  Additional Follow-up for Phone Call Additional follow up Details #1::        left message on machine with results asked pt to call if any questions. rx sent to pharmacy Additional Follow-up by: DeShannon Katrinka Blazing CMA Duncan Dull),  January 19, 2011 12:45 PM    Prescriptions: PREDNISONE 10 MG TABS (PREDNISONE) take 1 by mouth once daily or as directed  #100 x 1   Entered by:   Mervin Hack CMA (AAMA)   Authorized by:   Cindee Salt MD   Signed by:   Mervin Hack CMA (AAMA) on 01/19/2011   Method used:   Electronically to        [facility] [address] (retail)       [address]       [place], [place]  [address]       Ph: [phone]       Fax: [phone]   RxID:   [id number]

## 2011-02-11 LAB — GLUCOSE, CAPILLARY
Glucose-Capillary: 101 mg/dL — ABNORMAL HIGH (ref 70–99)
Glucose-Capillary: 110 mg/dL — ABNORMAL HIGH (ref 70–99)
Glucose-Capillary: 127 mg/dL — ABNORMAL HIGH (ref 70–99)
Glucose-Capillary: 85 mg/dL (ref 70–99)

## 2011-02-11 LAB — DIFFERENTIAL
Basophils Absolute: 0 10*3/uL (ref 0.0–0.1)
Lymphocytes Relative: 17 % (ref 12–46)
Monocytes Absolute: 0.6 10*3/uL (ref 0.1–1.0)
Monocytes Relative: 9 % (ref 3–12)
Neutro Abs: 4.6 10*3/uL (ref 1.7–7.7)
Neutrophils Relative %: 68 % (ref 43–77)

## 2011-02-11 LAB — POCT CARDIAC MARKERS
CKMB, poc: 2.8 ng/mL (ref 1.0–8.0)
CKMB, poc: 3.3 ng/mL (ref 1.0–8.0)
Myoglobin, poc: 384 ng/mL (ref 12–200)
Troponin i, poc: <0.05 ng/mL (ref 0.00–0.09)
Troponin i, poc: <0.05 ng/mL (ref 0.00–0.09)

## 2011-02-11 LAB — CBC
HCT: 31.3 % — ABNORMAL LOW (ref 39.0–52.0)
HCT: 36.1 % — ABNORMAL LOW (ref 39.0–52.0)
Hemoglobin: 10.3 g/dL — ABNORMAL LOW (ref 13.0–17.0)
MCHC: 32.9 g/dL (ref 30.0–36.0)
MCHC: 33.2 g/dL (ref 30.0–36.0)
MCV: 89.6 fL (ref 78.0–100.0)
MCV: 89.7 fL (ref 78.0–100.0)
Platelets: 143 10*3/uL — ABNORMAL LOW (ref 150–400)
RBC: 3.49 MIL/uL — ABNORMAL LOW (ref 4.22–5.81)
RDW: 11.9 % (ref 11.5–15.5)
WBC: 6.9 10*3/uL (ref 4.0–10.5)

## 2011-02-11 LAB — URINALYSIS, ROUTINE W REFLEX MICROSCOPIC
Bilirubin Urine: NEGATIVE
Glucose, UA: NEGATIVE mg/dL
Hgb urine dipstick: NEGATIVE
Specific Gravity, Urine: 1.015 (ref 1.005–1.030)
Urobilinogen, UA: 0.2 mg/dL (ref 0.0–1.0)

## 2011-02-11 LAB — BASIC METABOLIC PANEL
CO2: 26 mEq/L (ref 19–32)
Chloride: 111 mEq/L (ref 96–112)
Creatinine, Ser: 1.53 mg/dL — ABNORMAL HIGH (ref 0.4–1.5)
GFR calc Af Amer: 52 mL/min — ABNORMAL LOW (ref >60)
Potassium: 4.5 mEq/L (ref 3.5–5.1)
Sodium: 141 mEq/L (ref 135–145)

## 2011-02-11 LAB — COMPREHENSIVE METABOLIC PANEL
Albumin: 3.6 g/dL (ref 3.5–5.2)
BUN: 37 mg/dL — ABNORMAL HIGH (ref 6–23)
Creatinine, Ser: 2.08 mg/dL — ABNORMAL HIGH (ref 0.4–1.5)
Glucose, Bld: 192 mg/dL — ABNORMAL HIGH (ref 70–99)
Total Protein: 7.2 g/dL (ref 6.0–8.3)

## 2011-02-11 LAB — CK TOTAL AND CKMB (NOT AT ARMC)
CK, MB: 3.3 ng/mL (ref 0.3–4.0)
Relative Index: 2.2 (ref 0.0–2.5)

## 2011-02-11 LAB — TROPONIN I: Troponin I: 0.02 ng/mL (ref 0.00–0.06)

## 2011-02-22 ENCOUNTER — Other Ambulatory Visit: Payer: Self-pay | Admitting: Internal Medicine

## 2011-02-22 ENCOUNTER — Telehealth: Payer: Self-pay | Admitting: *Deleted

## 2011-02-22 NOTE — Telephone Encounter (Signed)
Patient came in dropped off CD from an office visit with [facility], pt went for knee pain, not sure what the patient wanted you to do with this, I've tried calling pt to ask. Form and disc on your desk.

## 2011-02-24 NOTE — Telephone Encounter (Signed)
Spoke to him Couldn't view x-ray on disk--will leave for him to pick up Knee is better on tramadol and brief course of indomethacin

## 2011-03-15 ENCOUNTER — Telehealth: Payer: Self-pay | Admitting: Internal Medicine

## 2011-03-15 NOTE — Telephone Encounter (Signed)
Patient dropped off study consent form from [facility] and asked my advice Using [place] for diabetics with poorly controlled diabetes to see if it decreases cardiovascular events  I told him that though I thought the drug was safe, his control is fine (A1c 7.4%) so I would worry about him getting low sugar reactions. He appreciated my input and will not participate

## 2011-03-24 ENCOUNTER — Encounter: Payer: Self-pay | Admitting: Internal Medicine

## 2011-03-28 ENCOUNTER — Ambulatory Visit (INDEPENDENT_AMBULATORY_CARE_PROVIDER_SITE_OTHER): Payer: 59 | Admitting: Internal Medicine

## 2011-03-28 ENCOUNTER — Encounter: Payer: Self-pay | Admitting: Internal Medicine

## 2011-03-28 VITALS — BP 152/78 | HR 64 | Temp 97.6°F | Wt 172.0 lb

## 2011-03-28 DIAGNOSIS — M353 Polymyalgia rheumatica: Secondary | ICD-10-CM

## 2011-03-28 DIAGNOSIS — N400 Enlarged prostate without lower urinary tract symptoms: Secondary | ICD-10-CM

## 2011-03-28 DIAGNOSIS — E1149 Type 2 diabetes mellitus with other diabetic neurological complication: Secondary | ICD-10-CM

## 2011-03-28 DIAGNOSIS — I2510 Atherosclerotic heart disease of native coronary artery without angina pectoris: Secondary | ICD-10-CM

## 2011-03-28 DIAGNOSIS — E785 Hyperlipidemia, unspecified: Secondary | ICD-10-CM

## 2011-03-28 DIAGNOSIS — I10 Essential (primary) hypertension: Secondary | ICD-10-CM

## 2011-03-28 LAB — SEDIMENTATION RATE: Sed Rate: 14 mm/hr (ref 0–22)

## 2011-03-28 LAB — HEMOGLOBIN A1C: Hgb A1c MFr Bld: 8.4 % — ABNORMAL HIGH (ref 4.6–6.5)

## 2011-03-28 MED ORDER — TAMSULOSIN HCL 0.4 MG PO CAPS: 0.4000 mg | ORAL_CAPSULE | Freq: Every day | ORAL | Status: DC

## 2011-03-28 NOTE — Progress Notes (Signed)
Subjective:    Patient ID: Evan [name], male    DOB: Nov 10, 1917, 77 y.o.   MRN: [id number]  HPI Doing fairly well Notes some redness on plantar feet Occ tingling and numbness in feet---actually better than in past No pain No ulcers or open area Washes and checks daily  Checks sugars tid Has not been adjusting insulin---just takes if over 250 Discussed cutting back on fingersticks Usually under 150---highest is 220's if he eats at night NO hypoglycemic reactions  No chest pain No SOB No sig edema Stays active in yard and with church  No aching or fatigue unless he pushes it too much Still on daily prednisone 08/11/09/5  Still with urinary freq Mostly just nocturia x 1  Current outpatient prescriptions:aspirin 81 MG tablet, Take 81 mg by mouth daily.  , Disp: , Rfl: ;  brimonidine-timolol (COMBIGAN) 0.2-0.5 % ophthalmic solution, Place 1 drop into both eyes 2 (two) times daily.  , Disp: , Rfl: ;  glipiZIDE (GLUCOTROL) 2.5 MG 24 hr tablet, Take 2.5 mg by mouth daily.  , Disp: , Rfl: ;  glucose blood (FREESTYLE LITE) test strip, Patient test blood sugar three times daily , Disp: , Rfl:  hydrALAZINE (APRESOLINE) 50 MG tablet, Take 50 mg by mouth 4 (four) times daily.  , Disp: , Rfl: ;  indomethacin (INDOCIN) 25 MG capsule, Take 25 mg by mouth 3 (three) times daily as needed.  , Disp: , Rfl: ;  insulin aspart (NOVOLOG) 100 UNIT/ML injection, Inject into the skin 3 (three) times daily before meals.  , Disp: , Rfl: ;  latanoprost (XALATAN) 0.005 % ophthalmic solution, Place 1 drop into both eyes at bedtime.  , Disp: , Rfl:  nitroGLYCERIN (NITROSTAT) 0.4 MG SL tablet, Place 0.4 mg under the tongue every 5 (five) minutes as needed.  , Disp: , Rfl: ;  PLAVIX 75 MG tablet, TAKE 1 TABLET DAILY, Disp: 90 tablet, Rfl: 2;  polyethylene glycol (MIRALAX / GLYCOLAX) packet, Take 17 g by mouth daily.  , Disp: , Rfl: ;  pravastatin (PRAVACHOL) 40 MG tablet, Take 40 mg by mouth daily.  , Disp: , Rfl:    predniSONE (DELTASONE) 10 MG tablet, Take 10 mg by mouth daily. Take one daily then alternate 1/2 daily, Disp: , Rfl: ;  Tamsulosin HCl (FLOMAX) 0.4 MG CAPS, Take 0.4 mg by mouth daily.  , Disp: , Rfl: ;  traMADol (ULTRAM) 50 MG tablet, Take 50 mg by mouth 3 (three) times daily as needed.  , Disp: , Rfl: ;  verapamil (CALAN) 40 MG tablet, Take 40 mg by mouth 2 (two) times daily.  , Disp: , Rfl:   Past Medical History  Diagnosis Date  . CAD (coronary artery disease)   . Diabetes mellitus   . GERD (gastroesophageal reflux disease)   . Hyperlipidemia   . Arthritis   . Hypertension   . BPH (benign prostatic hypertrophy)   . Degenerative disc disease   . Polymyalgia rheumatica   . CVA (cerebral vascular accident)   . Gastroenteritis     Past Surgical History  Procedure Date  . Cataract extraction 1999 1992    glaucoma  . Angioplasty   . Esophageal dilation   . US echocardiography 04/2004    benign    No family history on file.  History   Social History  . Marital Status: Widowed    Spouse Name: N/A    Number of Children: N/A  . Years of Education: N/A  Occupational History  . retired    Social History Main Topics  . Smoking status: Former Games developer  . Smokeless tobacco: Never Used   Comment: quit over 20 years ago  . Alcohol Use: No  . Drug Use: No  . Sexually Active: Not on file   Other Topics Concern  . Not on file   Social History Narrative   Religion affecting care--involved with Church throughout life (deacon, etc)   Review of Systems Appetite is good Weight down 5# Sleeps well Uses tramadol for arthritis--rarely indomethacin (told him to avoid this in general)     Objective:   Physical Exam  Constitutional: He appears well-developed and well-nourished. No distress.  Neck: Normal range of motion. Neck supple. No thyromegaly present.  Cardiovascular: Normal rate, regular rhythm, normal heart sounds and intact distal pulses.  Exam reveals no gallop.         ??soft systolic murmur  Pulmonary/Chest: Effort normal and breath sounds normal. No respiratory distress. He has no wheezes. He has no rales.  Musculoskeletal: Normal range of motion. He exhibits no edema and no tenderness.  Lymphadenopathy:    He has no cervical adenopathy.  Neurological:       No weakness  Skin: Skin is warm. No rash noted.       No ulcers and feet look normal          Assessment & Plan:

## 2011-04-06 ENCOUNTER — Encounter: Payer: Self-pay | Admitting: *Deleted

## 2011-04-19 ENCOUNTER — Other Ambulatory Visit: Payer: Self-pay | Admitting: Internal Medicine

## 2011-05-20 ENCOUNTER — Other Ambulatory Visit: Payer: Self-pay | Admitting: *Deleted

## 2011-05-20 MED ORDER — TRAMADOL HCL 50 MG PO TABS: 50.0000 mg | ORAL_TABLET | Freq: Three times a day (TID) | ORAL | Status: DC | PRN

## 2011-05-20 NOTE — Telephone Encounter (Signed)
rx sent to pharmacy by e-script  

## 2011-05-20 NOTE — Telephone Encounter (Signed)
Faxed request from [place] is on your desk.

## 2011-05-20 NOTE — Telephone Encounter (Signed)
Okay #60 x 0 

## 2011-08-01 ENCOUNTER — Ambulatory Visit (INDEPENDENT_AMBULATORY_CARE_PROVIDER_SITE_OTHER): Payer: 59 | Admitting: Internal Medicine

## 2011-08-01 ENCOUNTER — Encounter: Payer: Self-pay | Admitting: Internal Medicine

## 2011-08-01 VITALS — BP 164/84 | HR 64 | Temp 97.5°F | Ht 75.0 in | Wt 176.2 lb

## 2011-08-01 DIAGNOSIS — M51379 Other intervertebral disc degeneration, lumbosacral region without mention of lumbar back pain or lower extremity pain: Secondary | ICD-10-CM

## 2011-08-01 DIAGNOSIS — N400 Enlarged prostate without lower urinary tract symptoms: Secondary | ICD-10-CM

## 2011-08-01 DIAGNOSIS — I2510 Atherosclerotic heart disease of native coronary artery without angina pectoris: Secondary | ICD-10-CM

## 2011-08-01 DIAGNOSIS — E1149 Type 2 diabetes mellitus with other diabetic neurological complication: Secondary | ICD-10-CM

## 2011-08-01 DIAGNOSIS — E785 Hyperlipidemia, unspecified: Secondary | ICD-10-CM

## 2011-08-01 DIAGNOSIS — M353 Polymyalgia rheumatica: Secondary | ICD-10-CM

## 2011-08-01 DIAGNOSIS — M5137 Other intervertebral disc degeneration, lumbosacral region: Secondary | ICD-10-CM

## 2011-08-01 DIAGNOSIS — Z23 Encounter for immunization: Secondary | ICD-10-CM

## 2011-08-01 DIAGNOSIS — I10 Essential (primary) hypertension: Secondary | ICD-10-CM

## 2011-08-01 LAB — CBC WITH DIFFERENTIAL/PLATELET
Basophils Absolute: 0 10*3/uL (ref 0.0–0.1)
Eosinophils Relative: 1.4 % (ref 0.0–5.0)
HCT: 38.5 % — ABNORMAL LOW (ref 39.0–52.0)
Lymphocytes Relative: 11.2 % — ABNORMAL LOW (ref 12.0–46.0)
Monocytes Relative: 7.4 % (ref 3.0–12.0)
Neutrophils Relative %: 79.7 % — ABNORMAL HIGH (ref 43.0–77.0)
Platelets: 170 10*3/uL (ref 150.0–400.0)
RDW: 12.4 % (ref 11.5–14.6)
WBC: 10.8 10*3/uL — ABNORMAL HIGH (ref 4.5–10.5)

## 2011-08-01 LAB — BASIC METABOLIC PANEL
BUN: 22 mg/dL (ref 6–23)
Calcium: 8.8 mg/dL (ref 8.4–10.5)
Creatinine, Ser: 1.6 mg/dL — ABNORMAL HIGH (ref 0.4–1.5)
GFR: 53.89 mL/min — ABNORMAL LOW (ref >60.00)
Potassium: 4.4 mEq/L (ref 3.5–5.1)

## 2011-08-01 LAB — TSH: TSH: 0.59 u[IU]/mL (ref 0.35–5.50)

## 2011-08-01 LAB — HEPATIC FUNCTION PANEL
AST: 17 U/L (ref 0–37)
Alkaline Phosphatase: 69 U/L (ref 39–117)
Bilirubin, Direct: 0.1 mg/dL (ref 0.0–0.3)
Total Bilirubin: 1 mg/dL (ref 0.3–1.2)

## 2011-08-01 LAB — LIPID PANEL
Cholesterol: 127 mg/dL (ref 0–200)
LDL Cholesterol: 63 mg/dL (ref 0–99)
Total CHOL/HDL Ratio: 2

## 2011-08-01 MED ORDER — PRAVASTATIN SODIUM 40 MG PO TABS: 40.0000 mg | ORAL_TABLET | Freq: Every day | ORAL | Status: DC

## 2011-08-01 MED ORDER — TAMSULOSIN HCL 0.4 MG PO CAPS: 0.4000 mg | ORAL_CAPSULE | Freq: Every day | ORAL | Status: DC

## 2011-08-01 MED ORDER — PREDNISONE 10 MG PO TABS: 10.0000 mg | ORAL_TABLET | ORAL | Status: DC

## 2011-08-01 MED ORDER — GLIPIZIDE ER 2.5 MG PO TB24: 2.5000 mg | ORAL_TABLET | Freq: Every day | ORAL | Status: DC

## 2011-08-01 NOTE — Assessment & Plan Note (Signed)
Seems to be quiet No changes needed Sees Dr Juliann Pares

## 2011-08-01 NOTE — Assessment & Plan Note (Signed)
BP Readings from Last 3 Encounters:  08/01/11 164/84  03/28/11 152/78  12/27/10 180/83   Up a bit today No more meds Does have occ dizziness

## 2011-08-01 NOTE — Progress Notes (Signed)
Subjective:    Patient ID: Evan [name], male    DOB: November 26, 1917, 77 y.o.   MRN: [id number]  HPI Doing well Did decrease the prednisone to 10mg  every other day No fatigue or sig myalgia since then  occ back pain---occ generalized Hasn't needed the tramadol much  Checking sugars daily Has only needed the novolog 3 times (for when sugar over 250) Takes the glipizide daily No hypoglycemic reactions  No heart trouble No chest pain or SOB Stays active --just at [employer] for church in [place] Does a little yard work  Voids okay Still has sig nocturia but no sig daytime problems  Current Outpatient Prescriptions on File Prior to Visit  Medication Sig Dispense Refill  . aspirin 81 MG tablet Take 81 mg by mouth daily.        . brimonidine-timolol (COMBIGAN) 0.2-0.5 % ophthalmic solution Place 1 drop into both eyes 2 (two) times daily.        Marland Kitchen glipiZIDE (GLUCOTROL) 2.5 MG 24 hr tablet Take 2.5 mg by mouth daily.        Marland Kitchen glucose blood (FREESTYLE LITE) test strip Patient test blood sugar three times daily       . hydrALAZINE (APRESOLINE) 50 MG tablet Take 50 mg by mouth 4 (four) times daily.        . insulin aspart (NOVOLOG) 100 UNIT/ML injection Inject into the skin 3 (three) times daily as needed. Before meals if sugar elevated      . latanoprost (XALATAN) 0.005 % ophthalmic solution Place 1 drop into both eyes at bedtime.        Marland Kitchen PLAVIX 75 MG tablet TAKE 1 TABLET DAILY  90 tablet  2  . pravastatin (PRAVACHOL) 40 MG tablet TAKE ONE TABLET BY MOUTH DAILY  90 tablet  1  . predniSONE (DELTASONE) 10 MG tablet Take 10 mg by mouth daily. Take one daily then alternate 1/2 daily      . Tamsulosin HCl (FLOMAX) 0.4 MG CAPS Take 1 capsule (0.4 mg total) by mouth daily.  30 capsule  11  . verapamil (CALAN) 40 MG tablet Take 40 mg by mouth 2 (two) times daily.        . indomethacin (INDOCIN) 25 MG capsule Take 25 mg by mouth 3 (three) times daily as needed.        . nitroGLYCERIN  (NITROSTAT) 0.4 MG SL tablet Place 0.4 mg under the tongue every 5 (five) minutes as needed.        . polyethylene glycol (MIRALAX / GLYCOLAX) packet Take 17 g by mouth daily.        . traMADol (ULTRAM) 50 MG tablet Take 1 tablet (50 mg total) by mouth 3 (three) times daily as needed.  60 tablet  0    Allergies  Allergen Reactions  . Penicillins     REACTION: unspecified    Past Medical History  Diagnosis Date  . CAD (coronary artery disease)   . Diabetes mellitus   . GERD (gastroesophageal reflux disease)   . Hyperlipidemia   . Arthritis   . Hypertension   . BPH (benign prostatic hypertrophy)   . Degenerative disc disease   . Polymyalgia rheumatica   . CVA (cerebral vascular accident)   . Gastroenteritis     Past Surgical History  Procedure Date  . Cataract extraction 1999 1992    glaucoma  . Angioplasty   . Esophageal dilation   . US echocardiography 04/2004    benign  No family history on file.  History   Social History  . Marital Status: Widowed    Spouse Name: N/A    Number of Children: N/A  . Years of Education: N/A   Occupational History  . retired    Social History Main Topics  . Smoking status: Former Games developer  . Smokeless tobacco: Never Used   Comment: quit over 20 years ago  . Alcohol Use: No  . Drug Use: No  . Sexually Active: Not on file   Other Topics Concern  . Not on file   Social History Narrative   Religion affecting care--involved with Church throughout life (deacon, etc)   Review of Systems Sleeps okay despite the nocturia. Uses urinal Appetite is fine Weight is up a few pounds occ mild edema in feet---seemed to worsen with diabetic socks     Objective:   Physical Exam  Constitutional: He appears well-developed and well-nourished. No distress.  Neck: Normal range of motion. Neck supple. No thyromegaly present.  Cardiovascular: Normal rate, regular rhythm and normal heart sounds.  Exam reveals no gallop.   No murmur  heard.      Faint pedal pulses  Pulmonary/Chest: Effort normal and breath sounds normal. No respiratory distress. He has no wheezes. He has no rales.  Musculoskeletal: He exhibits no edema and no tenderness.  Lymphadenopathy:    He has no cervical adenopathy.  Skin: No rash noted.       No ulcers or foot lesions Mild nail changes  Psychiatric: He has a normal mood and affect. His behavior is normal. Judgment and thought content normal.          Assessment & Plan:

## 2011-08-01 NOTE — Assessment & Plan Note (Signed)
Seems to be fine with lower prednisone Will recheck the sed rate

## 2011-08-01 NOTE — Assessment & Plan Note (Signed)
Sig nocturia but does okay in daytime No changes in med

## 2011-08-01 NOTE — Assessment & Plan Note (Signed)
Lab Results  Component Value Date   HGBA1C 8.4* 03/28/2011   Control is not as good Will consider adding low dose lantus (like 6 units daily) if worse

## 2011-08-01 NOTE — Assessment & Plan Note (Signed)
Improved Only needs the tramadol occ

## 2011-08-04 ENCOUNTER — Encounter: Payer: Self-pay | Admitting: *Deleted

## 2011-11-22 ENCOUNTER — Other Ambulatory Visit: Payer: Self-pay | Admitting: Internal Medicine

## 2011-11-28 ENCOUNTER — Encounter: Payer: Self-pay | Admitting: Internal Medicine

## 2011-11-28 ENCOUNTER — Ambulatory Visit (INDEPENDENT_AMBULATORY_CARE_PROVIDER_SITE_OTHER): Payer: 59 | Admitting: Internal Medicine

## 2011-11-28 VITALS — BP 158/70 | HR 69 | Temp 97.8°F | Ht 75.0 in | Wt 166.0 lb

## 2011-11-28 DIAGNOSIS — Z23 Encounter for immunization: Secondary | ICD-10-CM

## 2011-11-28 DIAGNOSIS — M51379 Other intervertebral disc degeneration, lumbosacral region without mention of lumbar back pain or lower extremity pain: Secondary | ICD-10-CM

## 2011-11-28 DIAGNOSIS — M353 Polymyalgia rheumatica: Secondary | ICD-10-CM

## 2011-11-28 DIAGNOSIS — M5137 Other intervertebral disc degeneration, lumbosacral region: Secondary | ICD-10-CM

## 2011-11-28 DIAGNOSIS — N400 Enlarged prostate without lower urinary tract symptoms: Secondary | ICD-10-CM

## 2011-11-28 DIAGNOSIS — I2510 Atherosclerotic heart disease of native coronary artery without angina pectoris: Secondary | ICD-10-CM

## 2011-11-28 DIAGNOSIS — E1149 Type 2 diabetes mellitus with other diabetic neurological complication: Secondary | ICD-10-CM

## 2011-11-28 DIAGNOSIS — I10 Essential (primary) hypertension: Secondary | ICD-10-CM

## 2011-11-28 LAB — MICROALBUMIN / CREATININE URINE RATIO
Creatinine,U: 137.1 mg/dL
Microalb Creat Ratio: 45.9 mg/g — ABNORMAL HIGH (ref 0.0–30.0)

## 2011-11-28 LAB — SEDIMENTATION RATE: Sed Rate: 28 mm/hr — ABNORMAL HIGH (ref 0–22)

## 2011-11-28 NOTE — Assessment & Plan Note (Signed)
BP Readings from Last 3 Encounters:  11/28/11 158/70  08/01/11 164/84  03/28/11 152/78   Repeat by me 156/64 on right Orthostatic risk Will add ACEI if urine microal abnormal

## 2011-11-28 NOTE — Progress Notes (Signed)
Subjective:    Patient ID: Evan [name], male    DOB: 1918/01/06, 78 y.o.   MRN: [id number]  HPI Did have viral  infection for a couple of weeks Cough and diarrhea Had to miss church for a while  Still a deacon at church Sings in choir Does a little yard work at times  Sugars okay Checks once a day---- 90-254. Average ~160-170 No hypoglycemic reactions Still uses the novolog if sugar is over 250  No muscle pain or fatigue Still on prednisone every other day Uses cane for stability on steps, etc  No heart trouble Keeps up with Dr Juliann Pares No chest pain NO dyspnea--no change in exercise tolerance  Uses tramadol for arthritic pain Only occ uses Not using indocin  Current Outpatient Prescriptions on File Prior to Visit  Medication Sig Dispense Refill  . aspirin 81 MG tablet Take 81 mg by mouth daily.        . brimonidine-timolol (COMBIGAN) 0.2-0.5 % ophthalmic solution Place 1 drop into both eyes 2 (two) times daily.        . clopidogrel (PLAVIX) 75 MG tablet TAKE 1 TABLET DAILY  90 tablet  0  . glipiZIDE (GLUCOTROL XL) 2.5 MG 24 hr tablet Take 1 tablet (2.5 mg total) by mouth daily.  90 tablet  3  . glucose blood (FREESTYLE LITE) test strip Patient test blood sugar three times daily       . hydrALAZINE (APRESOLINE) 50 MG tablet Take 50 mg by mouth 4 (four) times daily.        . indomethacin (INDOCIN) 25 MG capsule Take 25 mg by mouth 3 (three) times daily as needed.        . insulin aspart (NOVOLOG) 100 UNIT/ML injection Inject into the skin 3 (three) times daily as needed. Before meals if sugar elevated      . latanoprost (XALATAN) 0.005 % ophthalmic solution Place 1 drop into both eyes at bedtime.        . nitroGLYCERIN (NITROSTAT) 0.4 MG SL tablet Place 0.4 mg under the tongue every 5 (five) minutes as needed.        . polyethylene glycol (MIRALAX / GLYCOLAX) packet Take 17 g by mouth daily.        . pravastatin (PRAVACHOL) 40 MG tablet Take 1 tablet (40 mg total) by  mouth daily.  90 tablet  3  . predniSONE (DELTASONE) 10 MG tablet Take 1 tablet (10 mg total) by mouth every other day. Take one daily then alternate 1/2 daily  90 tablet  3  . Tamsulosin HCl (FLOMAX) 0.4 MG CAPS Take 1 capsule (0.4 mg total) by mouth daily.  30 capsule  11  . traMADol (ULTRAM) 50 MG tablet Take 1 tablet (50 mg total) by mouth 3 (three) times daily as needed.  60 tablet  0  . verapamil (CALAN) 40 MG tablet Take 40 mg by mouth 2 (two) times daily.          Allergies  Allergen Reactions  . Penicillins     REACTION: unspecified    Past Medical History  Diagnosis Date  . CAD (coronary artery disease)   . Diabetes mellitus   . GERD (gastroesophageal reflux disease)   . Hyperlipidemia   . Arthritis   . Hypertension   . BPH (benign prostatic hypertrophy)   . Degenerative disc disease   . Polymyalgia rheumatica   . CVA (cerebral vascular accident)   . Gastroenteritis     Past Surgical History  Procedure Date  . Cataract extraction 1999 1992    glaucoma  . Angioplasty   . Esophageal dilation   . US echocardiography 04/2004    benign    No family history on file.  History   Social History  . Marital Status: Widowed    Spouse Name: N/A    Number of Children: N/A  . Years of Education: N/A   Occupational History  . retired    Social History Main Topics  . Smoking status: Former Games developer  . Smokeless tobacco: Never Used   Comment: quit over 20 years ago  . Alcohol Use: No  . Drug Use: No  . Sexually Active: Not on file   Other Topics Concern  . Not on file   Social History Narrative   Religion affecting care--involved with Church throughout life (deacon, etc)   Review of Systems Still does puzzles, collects stamps, involved in church Appetite is good Weight down slightly Sleeps well Voids okay on tamsulosin---notices problems if he skips    Objective:   Physical Exam  Constitutional: He appears well-developed and well-nourished. No distress.   Neck: Normal range of motion. Neck supple.  Cardiovascular: Normal rate and regular rhythm.  Exam reveals no gallop.   Murmur heard.      Soft systolic murmur at base Faint pulse left foot--absent on right  Pulmonary/Chest: Effort normal and breath sounds normal. No respiratory distress. He has no wheezes. He has no rales.  Musculoskeletal: He exhibits no edema and no tenderness.  Lymphadenopathy:    He has no cervical adenopathy.  Skin:       No foot ulcers Left great toenail absent---not inflamed  Psychiatric: He has a normal mood and affect. His behavior is normal. Judgment and thought content normal.          Assessment & Plan:

## 2011-11-28 NOTE — Assessment & Plan Note (Signed)
Uses the tramadol infrequently

## 2011-11-28 NOTE — Assessment & Plan Note (Signed)
seems to be asymptomatic Will try to slightly wean prednisone if sed rate normal

## 2011-11-28 NOTE — Assessment & Plan Note (Signed)
Lab Results  Component Value Date   HGBA1C 8.0* 08/01/2011   probably reasonable control for age No changes unless sig increase

## 2011-11-28 NOTE — Assessment & Plan Note (Signed)
No angina---hasn't needed nitro On hydralazine Follows with Dr Juliann Pares

## 2011-11-28 NOTE — Assessment & Plan Note (Signed)
Okay on the tamsulsin

## 2011-12-13 ENCOUNTER — Encounter: Payer: Self-pay | Admitting: *Deleted

## 2011-12-13 ENCOUNTER — Telehealth: Payer: Self-pay | Admitting: *Deleted

## 2011-12-13 MED ORDER — LISINOPRIL 5 MG PO TABS: 5.0000 mg | ORAL_TABLET | Freq: Every day | ORAL | Status: DC

## 2011-12-13 NOTE — Telephone Encounter (Signed)
Spoke with patient and advised results letter mailed to patients home address with results. rx sent to pharmacy by e-script  

## 2011-12-13 NOTE — Telephone Encounter (Signed)
Message copied by Sueanne Margarita on Tue Dec 13, 2011  5:20 PM ------      Message from: Tillman Abide I      Created: Mon Nov 28, 2011  1:56 PM       Please call      The diabetes control is fine with HgbA1c of 7.6%      The sed rate---the inflammation test for the polymyalgia rheumatica---is still just slightly elevated. Please stay on the 10mg  prednisone every other day      The urine test shows some early diabetic kidney changes. This can also be related to blood pressure      Please add lisinopril 5 mg daily (1 year Rx). Set up met B in about 3 weeks (250.40)      Have him call if he has any trouble with this new med which will help his BP and protect his kidneys

## 2011-12-28 ENCOUNTER — Other Ambulatory Visit: Payer: Self-pay

## 2011-12-28 MED ORDER — VERAPAMIL HCL 40 MG PO TABS: 40.0000 mg | ORAL_TABLET | Freq: Two times a day (BID) | ORAL | Status: DC

## 2011-12-28 NOTE — Telephone Encounter (Signed)
Received fax request for refill from patients pharmacy.  Prescription refilled.

## 2012-01-10 ENCOUNTER — Other Ambulatory Visit: Payer: Self-pay | Admitting: *Deleted

## 2012-01-10 MED ORDER — TRAMADOL HCL 50 MG PO TABS: 50.0000 mg | ORAL_TABLET | Freq: Three times a day (TID) | ORAL | Status: DC | PRN

## 2012-01-10 NOTE — Telephone Encounter (Signed)
Faxed request from [facility].

## 2012-01-26 ENCOUNTER — Telehealth: Payer: Self-pay | Admitting: *Deleted

## 2012-01-26 NOTE — Telephone Encounter (Signed)
Received form from [employer] asking for documention and dr's signature for diabetic shoes. Marland Kitchenleft message to have patient return my call.

## 2012-01-30 NOTE — Telephone Encounter (Signed)
Form faxed back and scanned 

## 2012-02-17 ENCOUNTER — Emergency Department: Payer: Self-pay | Admitting: Internal Medicine

## 2012-02-17 LAB — URINALYSIS, COMPLETE
Bacteria: NONE SEEN
Bilirubin,UR: NEGATIVE
Blood: NEGATIVE
Glucose,UR: NEGATIVE mg/dL
Ketone: NEGATIVE
Leukocyte Esterase: NEGATIVE
Nitrite: NEGATIVE
Ph: 5
Protein: 30
RBC,UR: 2 /HPF
Specific Gravity: 1.011
Squamous Epithelial: <1
WBC UR: <1 /HPF

## 2012-02-17 LAB — CBC
HCT: 38.5 % — ABNORMAL LOW
HGB: 12.3 g/dL — ABNORMAL LOW
MCH: 28.8 pg
MCHC: 31.8 g/dL — ABNORMAL LOW
MCV: 90 fL
Platelet: 190 x10 3/mm 3
RBC: 4.27 x10 6/mm 3 — ABNORMAL LOW
RDW: 11.9 %
WBC: 9.7 x10 3/mm 3

## 2012-02-17 LAB — COMPREHENSIVE METABOLIC PANEL
Anion Gap: 10 (ref 7–16)
Bilirubin,Total: 0.8 mg/dL (ref 0.2–1.0)
Calcium, Total: 8.8 mg/dL (ref 8.5–10.1)
Co2: 28 mmol/L (ref 21–32)
Creatinine: 1.7 mg/dL — ABNORMAL HIGH (ref 0.60–1.30)
EGFR (African American): 40 — ABNORMAL LOW
Glucose: 97 mg/dL (ref 65–99)
SGPT (ALT): 13 U/L
Total Protein: 7.6 g/dL (ref 6.4–8.2)

## 2012-02-17 LAB — TROPONIN I: Troponin-I: <0.02 ng/mL

## 2012-02-19 ENCOUNTER — Other Ambulatory Visit: Payer: Self-pay | Admitting: Internal Medicine

## 2012-03-27 ENCOUNTER — Other Ambulatory Visit: Payer: Self-pay | Admitting: *Deleted

## 2012-03-27 ENCOUNTER — Encounter: Payer: Self-pay | Admitting: Internal Medicine

## 2012-03-27 ENCOUNTER — Ambulatory Visit (INDEPENDENT_AMBULATORY_CARE_PROVIDER_SITE_OTHER): Payer: 59 | Admitting: Internal Medicine

## 2012-03-27 VITALS — BP 128/60 | HR 61 | Temp 97.9°F | Ht 75.0 in | Wt 162.0 lb

## 2012-03-27 DIAGNOSIS — E785 Hyperlipidemia, unspecified: Secondary | ICD-10-CM

## 2012-03-27 DIAGNOSIS — M5137 Other intervertebral disc degeneration, lumbosacral region: Secondary | ICD-10-CM

## 2012-03-27 DIAGNOSIS — E1149 Type 2 diabetes mellitus with other diabetic neurological complication: Secondary | ICD-10-CM

## 2012-03-27 DIAGNOSIS — N400 Enlarged prostate without lower urinary tract symptoms: Secondary | ICD-10-CM

## 2012-03-27 DIAGNOSIS — I2510 Atherosclerotic heart disease of native coronary artery without angina pectoris: Secondary | ICD-10-CM

## 2012-03-27 DIAGNOSIS — I10 Essential (primary) hypertension: Secondary | ICD-10-CM

## 2012-03-27 DIAGNOSIS — M353 Polymyalgia rheumatica: Secondary | ICD-10-CM

## 2012-03-27 DIAGNOSIS — E1142 Type 2 diabetes mellitus with diabetic polyneuropathy: Secondary | ICD-10-CM

## 2012-03-27 LAB — HEPATIC FUNCTION PANEL
AST: 14 U/L (ref 0–37)
Alkaline Phosphatase: 51 U/L (ref 39–117)
Total Bilirubin: 1 mg/dL (ref 0.3–1.2)

## 2012-03-27 LAB — BASIC METABOLIC PANEL
BUN: 29 mg/dL — ABNORMAL HIGH (ref 6–23)
CO2: 30 mEq/L (ref 19–32)
Chloride: 107 mEq/L (ref 96–112)
Glucose, Bld: 160 mg/dL — ABNORMAL HIGH (ref 70–99)
Potassium: 4.1 mEq/L (ref 3.5–5.1)

## 2012-03-27 LAB — CBC WITH DIFFERENTIAL/PLATELET
Basophils Absolute: 0 10*3/uL (ref 0.0–0.1)
Eosinophils Absolute: 0.6 10*3/uL (ref 0.0–0.7)
Lymphocytes Relative: 18 % (ref 12.0–46.0)
Lymphs Abs: 1.6 10*3/uL (ref 0.7–4.0)
Monocytes Relative: 7.7 % (ref 3.0–12.0)
Platelets: 167 10*3/uL (ref 150.0–400.0)
RDW: 12.4 % (ref 11.5–14.6)

## 2012-03-27 LAB — LIPID PANEL
Cholesterol: 118 mg/dL (ref 0–200)
LDL Cholesterol: 49 mg/dL (ref 0–99)

## 2012-03-27 LAB — HEMOGLOBIN A1C: Hgb A1c MFr Bld: 7.1 % — ABNORMAL HIGH (ref 4.6–6.5)

## 2012-03-27 LAB — SEDIMENTATION RATE: Sed Rate: 16 mm/hr (ref 0–22)

## 2012-03-27 MED ORDER — TRAMADOL HCL 50 MG PO TABS: 50.0000 mg | ORAL_TABLET | Freq: Three times a day (TID) | ORAL | Status: DC | PRN

## 2012-03-27 MED ORDER — GLIPIZIDE ER 2.5 MG PO TB24: 2.5000 mg | ORAL_TABLET | Freq: Every day | ORAL | Status: DC

## 2012-03-27 MED ORDER — VERAPAMIL HCL 40 MG PO TABS: 40.0000 mg | ORAL_TABLET | Freq: Two times a day (BID) | ORAL | Status: DC

## 2012-03-27 MED ORDER — PRAVASTATIN SODIUM 40 MG PO TABS: 40.0000 mg | ORAL_TABLET | Freq: Every day | ORAL | Status: DC

## 2012-03-27 MED ORDER — HYDRALAZINE HCL 50 MG PO TABS: 50.0000 mg | ORAL_TABLET | Freq: Four times a day (QID) | ORAL | Status: DC

## 2012-03-27 MED ORDER — TAMSULOSIN HCL 0.4 MG PO CAPS: 0.4000 mg | ORAL_CAPSULE | Freq: Every day | ORAL | Status: DC

## 2012-03-27 MED ORDER — CLOPIDOGREL BISULFATE 75 MG PO TABS: 75.0000 mg | ORAL_TABLET | Freq: Every day | ORAL | Status: DC

## 2012-03-27 MED ORDER — LISINOPRIL 5 MG PO TABS: 5.0000 mg | ORAL_TABLET | Freq: Every day | ORAL | Status: DC

## 2012-03-27 NOTE — Assessment & Plan Note (Addendum)
Good control for age Lab Results  Component Value Date   HGBA1C 7.6* 11/28/2011   Will recheck No changes  Gets burning and tingling in feet but not bad Will hold off on meds

## 2012-03-27 NOTE — Assessment & Plan Note (Signed)
controlled with every other day prednisone Will plan to continue

## 2012-03-27 NOTE — Assessment & Plan Note (Signed)
Seems to be quiet Sees Dr Juliann Pares

## 2012-03-27 NOTE — Assessment & Plan Note (Signed)
No problems with statin Check labs

## 2012-03-27 NOTE — Progress Notes (Signed)
Subjective:    Patient ID: Evan [name], male    DOB: October 27, 1918, 78 y.o.   MRN: [id number]  HPI Doing fairly well Did go to ER after urgent care on 4/12---couldn't get through here Started on antibiotic and is better now  No muscle pain or aching Has arthritic pain in knees Takes tramadol prn--perhaps 1-2 per week  Still checks sugars 107-239 Averaging just over 150 No hypoglycemic spells  No chest pain No SOB Still does yard work and is active at [employer] No problems with lisinopril No cough or throat problems except with the bronchitis  Voids okay Nocturia x 2 generally Current Outpatient Prescriptions on File Prior to Visit  Medication Sig Dispense Refill  . aspirin 81 MG tablet Take 81 mg by mouth daily.        . brimonidine-timolol (COMBIGAN) 0.2-0.5 % ophthalmic solution Place 1 drop into both eyes 2 (two) times daily.        . clopidogrel (PLAVIX) 75 MG tablet TAKE 1 TABLET DAILY  90 tablet  3  . glipiZIDE (GLUCOTROL XL) 2.5 MG 24 hr tablet Take 1 tablet (2.5 mg total) by mouth daily.  90 tablet  3  . glucose blood (FREESTYLE LITE) test strip Patient test blood sugar three times daily       . hydrALAZINE (APRESOLINE) 50 MG tablet Take 50 mg by mouth 4 (four) times daily.        . insulin aspart (NOVOLOG) 100 UNIT/ML injection Inject into the skin 3 (three) times daily as needed. Before meals if sugar elevated      . latanoprost (XALATAN) 0.005 % ophthalmic solution Place 1 drop into both eyes at bedtime.        Marland Kitchen lisinopril (PRINIVIL,ZESTRIL) 5 MG tablet Take 1 tablet (5 mg total) by mouth daily.  90 tablet  3  . nitroGLYCERIN (NITROSTAT) 0.4 MG SL tablet Place 0.4 mg under the tongue every 5 (five) minutes as needed.        . polyethylene glycol (MIRALAX / GLYCOLAX) packet Take 17 g by mouth daily.        . pravastatin (PRAVACHOL) 40 MG tablet Take 1 tablet (40 mg total) by mouth daily.  90 tablet  3  . predniSONE (DELTASONE) 10 MG tablet Take 1 tablet (10 mg total)  by mouth every other day. Take one daily then alternate 1/2 daily  90 tablet  3  . Tamsulosin HCl (FLOMAX) 0.4 MG CAPS Take 1 capsule (0.4 mg total) by mouth daily.  30 capsule  11  . traMADol (ULTRAM) 50 MG tablet Take 1 tablet (50 mg total) by mouth 3 (three) times daily as needed.  60 tablet  0  . verapamil (CALAN) 40 MG tablet Take 1 tablet (40 mg total) by mouth 2 (two) times daily.  60 tablet  3    Allergies  Allergen Reactions  . Penicillins     REACTION: unspecified    Past Medical History  Diagnosis Date  . CAD (coronary artery disease)   . Diabetes mellitus   . GERD (gastroesophageal reflux disease)   . Hyperlipidemia   . Arthritis   . Hypertension   . BPH (benign prostatic hypertrophy)   . Degenerative disc disease   . Polymyalgia rheumatica   . CVA (cerebral vascular accident)   . Gastroenteritis     Past Surgical History  Procedure Date  . Cataract extraction 1999 1992    glaucoma  . Angioplasty   . Esophageal dilation   .  US echocardiography 04/2004    benign    No family history on file.  History   Social History  . Marital Status: Widowed    Spouse Name: N/A    Number of Children: N/A  . Years of Education: N/A   Occupational History  . retired    Social History Main Topics  . Smoking status: Former Games developer  . Smokeless tobacco: Never Used   Comment: quit over 20 years ago  . Alcohol Use: No  . Drug Use: No  . Sexually Active: Not on file   Other Topics Concern  . Not on file   Social History Narrative   Religion affecting care--involved with Church throughout life (deacon, etc)   Review of Systems Sleep is variable---mostly sleeps well Appetite is good Weight is down some      Objective:   Physical Exam  Constitutional: He appears well-developed and well-nourished. No distress.  Neck: Normal range of motion. Neck supple. No thyromegaly present.  Cardiovascular: Normal rate, regular rhythm, normal heart sounds and intact distal  pulses.  Exam reveals no gallop.   No murmur heard. Pulmonary/Chest: Effort normal and breath sounds normal. No respiratory distress. He has no wheezes. He has no rales.  Musculoskeletal: He exhibits no edema and no tenderness.  Lymphadenopathy:    He has no cervical adenopathy.  Skin: No rash noted. No erythema.       No foot lesions  Psychiatric: He has a normal mood and affect. His behavior is normal.          Assessment & Plan:

## 2012-03-27 NOTE — Assessment & Plan Note (Signed)
Voiding okay Continue med

## 2012-03-27 NOTE — Assessment & Plan Note (Signed)
BP Readings from Last 3 Encounters:  03/27/12 128/60  11/28/11 158/70  08/01/11 164/84   Much better on low dose lisinopril Check renal today

## 2012-03-27 NOTE — Assessment & Plan Note (Signed)
Uses the tramadol sparingly

## 2012-03-29 ENCOUNTER — Encounter: Payer: Self-pay | Admitting: *Deleted

## 2012-07-26 ENCOUNTER — Inpatient Hospital Stay: Payer: Self-pay | Admitting: Internal Medicine

## 2012-07-26 LAB — COMPREHENSIVE METABOLIC PANEL
Albumin: 3.7 g/dL (ref 3.4–5.0)
Anion Gap: 9 (ref 7–16)
BUN: 27 mg/dL — ABNORMAL HIGH (ref 7–18)
Bilirubin,Total: 1.3 mg/dL — ABNORMAL HIGH (ref 0.2–1.0)
Chloride: 106 mmol/L (ref 98–107)
Creatinine: 1.7 mg/dL — ABNORMAL HIGH (ref 0.60–1.30)
EGFR (African American): 40 — ABNORMAL LOW
Osmolality: 288 (ref 275–301)
Potassium: 3.9 mmol/L (ref 3.5–5.1)
SGPT (ALT): 21 U/L (ref 12–78)
Sodium: 140 mmol/L (ref 136–145)
Total Protein: 8.5 g/dL — ABNORMAL HIGH (ref 6.4–8.2)

## 2012-07-26 LAB — URINALYSIS, COMPLETE
Bacteria: NONE SEEN
Bilirubin,UR: NEGATIVE
Glucose,UR: NEGATIVE mg/dL (ref 0–75)
Ph: 5 (ref 4.5–8.0)
Protein: 100
Specific Gravity: 1.014 (ref 1.003–1.030)
WBC UR: 38 /HPF (ref 0–5)

## 2012-07-26 LAB — CBC
HCT: 40.3 % (ref 40.0–52.0)
MCH: 29.6 pg (ref 26.0–34.0)
MCHC: 33.3 g/dL (ref 32.0–36.0)
MCV: 89 fL (ref 80–100)
RDW: 12.2 % (ref 11.5–14.5)
WBC: 23.2 10*3/uL — ABNORMAL HIGH (ref 3.8–10.6)

## 2012-07-26 LAB — CK TOTAL AND CKMB (NOT AT ARMC)
CK, Total: 74 U/L (ref 35–232)
CK-MB: 2.1 ng/mL (ref 0.5–3.6)

## 2012-07-26 LAB — TROPONIN I: Troponin-I: <0.02 ng/mL

## 2012-07-28 LAB — COMPREHENSIVE METABOLIC PANEL
Albumin: 2.5 g/dL — ABNORMAL LOW (ref 3.4–5.0)
Anion Gap: 11 (ref 7–16)
BUN: 26 mg/dL — ABNORMAL HIGH (ref 7–18)
Calcium, Total: 8 mg/dL — ABNORMAL LOW (ref 8.5–10.1)
Chloride: 108 mmol/L — ABNORMAL HIGH (ref 98–107)
Co2: 23 mmol/L (ref 21–32)
Creatinine: 1.55 mg/dL — ABNORMAL HIGH (ref 0.60–1.30)
EGFR (African American): 44 — ABNORMAL LOW
EGFR (Non-African Amer.): 38 — ABNORMAL LOW
Glucose: 100 mg/dL — ABNORMAL HIGH (ref 65–99)
SGOT(AST): 14 U/L — ABNORMAL LOW (ref 15–37)
Sodium: 142 mmol/L (ref 136–145)

## 2012-07-28 LAB — CBC WITH DIFFERENTIAL/PLATELET
Basophil %: 0.7 %
Eosinophil #: 0.2 10*3/uL (ref 0.0–0.7)
HCT: 33.6 % — ABNORMAL LOW (ref 40.0–52.0)
HGB: 11.2 g/dL — ABNORMAL LOW (ref 13.0–18.0)
Lymphocyte #: 1.4 10*3/uL (ref 1.0–3.6)
MCH: 29.4 pg (ref 26.0–34.0)
MCHC: 33.3 g/dL (ref 32.0–36.0)
Monocyte #: 1.3 x10 3/mm — ABNORMAL HIGH (ref 0.2–1.0)
Neutrophil #: 14.9 10*3/uL — ABNORMAL HIGH (ref 1.4–6.5)
RDW: 12.1 % (ref 11.5–14.5)

## 2012-07-29 LAB — CBC WITH DIFFERENTIAL/PLATELET
Basophil #: 0 10*3/uL (ref 0.0–0.1)
Eosinophil #: 0 10*3/uL (ref 0.0–0.7)
Eosinophil %: 0.2 %
HCT: 30.5 % — ABNORMAL LOW (ref 40.0–52.0)
MCH: 28.8 pg (ref 26.0–34.0)
MCHC: 32.9 g/dL (ref 32.0–36.0)
Monocyte #: 1.1 x10 3/mm — ABNORMAL HIGH (ref 0.2–1.0)
Neutrophil #: 11.6 10*3/uL — ABNORMAL HIGH (ref 1.4–6.5)
Neutrophil %: 83.5 %
Platelet: 161 10*3/uL (ref 150–440)
RBC: 3.48 10*6/uL — ABNORMAL LOW (ref 4.40–5.90)
RDW: 12.3 % (ref 11.5–14.5)
WBC: 13.9 10*3/uL — ABNORMAL HIGH (ref 3.8–10.6)

## 2012-07-29 LAB — BASIC METABOLIC PANEL
Anion Gap: 9 (ref 7–16)
Calcium, Total: 8.2 mg/dL — ABNORMAL LOW (ref 8.5–10.1)
Calcium, Total: 8.5 mg/dL (ref 8.5–10.1)
Co2: 24 mmol/L (ref 21–32)
Co2: 25 mmol/L (ref 21–32)
Creatinine: 1.71 mg/dL — ABNORMAL HIGH (ref 0.60–1.30)
EGFR (African American): 39 — ABNORMAL LOW
EGFR (Non-African Amer.): 34 — ABNORMAL LOW
EGFR (Non-African Amer.): 37 — ABNORMAL LOW
Glucose: 145 mg/dL — ABNORMAL HIGH (ref 65–99)
Glucose: 171 mg/dL — ABNORMAL HIGH (ref 65–99)
Osmolality: 289 (ref 275–301)
Osmolality: 290 (ref 275–301)
Potassium: 3.9 mmol/L (ref 3.5–5.1)
Sodium: 141 mmol/L (ref 136–145)

## 2012-07-29 LAB — URINE CULTURE

## 2012-07-31 ENCOUNTER — Encounter: Payer: Self-pay | Admitting: Internal Medicine

## 2012-07-31 ENCOUNTER — Ambulatory Visit (INDEPENDENT_AMBULATORY_CARE_PROVIDER_SITE_OTHER): Payer: 59 | Admitting: Internal Medicine

## 2012-07-31 VITALS — BP 130/60 | HR 70 | Temp 98.3°F | Ht 75.0 in | Wt 162.0 lb

## 2012-07-31 DIAGNOSIS — K5909 Other constipation: Secondary | ICD-10-CM

## 2012-07-31 DIAGNOSIS — K5900 Constipation, unspecified: Secondary | ICD-10-CM

## 2012-07-31 DIAGNOSIS — N390 Urinary tract infection, site not specified: Secondary | ICD-10-CM

## 2012-07-31 NOTE — Patient Instructions (Addendum)
  Take miralax 1 capful every 3-4 hours till your bowels clear out. Then you can decrease to once a day once your bowels move regularly. If this isn't enough to keep your bowels moving, you can use the senna-s daily also

## 2012-07-31 NOTE — Assessment & Plan Note (Addendum)
Presumed diagnosis Not sure why they put him on metronidazole also Could likely have diverticulitis as the discharge diagnosis  Likely from obstipation Will review discharge summary when available Finish out meds Will address bowel problems as well

## 2012-07-31 NOTE — Progress Notes (Signed)
Subjective:    Patient ID: Evan [name], male    DOB: [birth year], 78 y.o.   MRN: [id number]  HPI Here with friend Had some lower abdominal pain Had dysuria and incontinence Was constipated with just "balls" Doesn't think he had fever Went to ER due to "aching all over"  Diagnosed with urinary retention and then infection due to obstipation Treated with antibiotics  Rx rectiv (rectal nitroglycerin) for his bowels Had been using miralax but not regularly  Appetite is fine Weight is stable Bowels are still slow---has to strain still No blood in stool  Current Outpatient Prescriptions on File Prior to Visit  Medication Sig Dispense Refill  . aspirin 81 MG tablet Take 81 mg by mouth daily.        . BD INSULIN SYRINGE ULTRAFINE 31G X 5/16" 0.3 ML MISC       . brimonidine-timolol (COMBIGAN) 0.2-0.5 % ophthalmic solution Place 1 drop into both eyes 2 (two) times daily.        . clopidogrel (PLAVIX) 75 MG tablet Take 1 tablet (75 mg total) by mouth daily.  90 tablet  3  . gabapentin (NEURONTIN) 100 MG capsule Take 100 mg by mouth at bedtime.       Marland Kitchen glipiZIDE (GLUCOTROL XL) 2.5 MG 24 hr tablet Take 1 tablet (2.5 mg total) by mouth daily.  90 tablet  3  . glucose blood (FREESTYLE LITE) test strip Patient test blood sugar three times daily       . hydrALAZINE (APRESOLINE) 50 MG tablet Take 1 tablet (50 mg total) by mouth 4 (four) times daily.  360 tablet  3  . insulin aspart (NOVOLOG) 100 UNIT/ML injection Inject into the skin 3 (three) times daily as needed. Before meals if sugar elevated      . latanoprost (XALATAN) 0.005 % ophthalmic solution Place 1 drop into both eyes at bedtime.        Marland Kitchen lisinopril (PRINIVIL,ZESTRIL) 5 MG tablet Take 1 tablet (5 mg total) by mouth daily.  90 tablet  3  . nitroGLYCERIN (NITROSTAT) 0.4 MG SL tablet Place 0.4 mg under the tongue every 5 (five) minutes as needed.        . Nitroglycerin (RECTIV) 0.4 % OINT Place rectally daily.      . polyethylene  glycol (MIRALAX / GLYCOLAX) packet Take 17 g by mouth daily.        . pravastatin (PRAVACHOL) 40 MG tablet Take 1 tablet (40 mg total) by mouth daily.  90 tablet  3  . predniSONE (DELTASONE) 10 MG tablet Take 1 tablet (10 mg total) by mouth every other day. Take one daily then alternate 1/2 daily  90 tablet  3  . Tamsulosin HCl (FLOMAX) 0.4 MG CAPS Take 1 capsule (0.4 mg total) by mouth daily.  90 capsule  3  . traMADol (ULTRAM) 50 MG tablet Take 1 tablet (50 mg total) by mouth 3 (three) times daily as needed.  120 tablet  0  . verapamil (CALAN) 40 MG tablet Take 1 tablet (40 mg total) by mouth 2 (two) times daily.  180 tablet  3    Allergies  Allergen Reactions  . Penicillins     REACTION: unspecified    Past Medical History  Diagnosis Date  . CAD (coronary artery disease)   . Diabetes mellitus   . GERD (gastroesophageal reflux disease)   . Hyperlipidemia   . Arthritis   . Hypertension   . BPH (benign prostatic hypertrophy)   .  Degenerative disc disease   . Polymyalgia rheumatica   . CVA (cerebral vascular accident)   . Gastroenteritis     Past Surgical History  Procedure Date  . Cataract extraction 1999 1992    glaucoma  . Angioplasty   . Esophageal dilation   . US echocardiography 04/2004    benign    No family history on file.  History   Social History  . Marital Status: Widowed    Spouse Name: N/A    Number of Children: N/A  . Years of Education: N/A   Occupational History  . retired    Social History Main Topics  . Smoking status: Former Games developer  . Smokeless tobacco: Never Used   Comment: quit over 20 years ago  . Alcohol Use: No  . Drug Use: No  . Sexually Active: Not on file   Other Topics Concern  . Not on file   Social History Narrative   Religion affecting care--involved with Church throughout life (deacon, etc)   Review of Systems No chest pain No SOB Sleep is not good since discharge---home 2 days ago Given gabapentin to start for  diabetic neuropathy     Objective:   Physical Exam  Constitutional: He appears well-developed and well-nourished.  Neck: Normal range of motion. Neck supple.  Cardiovascular: Normal rate, regular rhythm and normal heart sounds.  Exam reveals no gallop.   No murmur heard. Pulmonary/Chest: Effort normal and breath sounds normal. No respiratory distress. He has no wheezes. He has no rales.  Abdominal: Soft. Bowel sounds are normal. He exhibits no distension. There is no tenderness. There is no rebound.  Musculoskeletal:       Difficulty getting on table No focal weakness  Lymphadenopathy:    He has no cervical adenopathy.  Psychiatric: He has a normal mood and affect. His behavior is normal.          Assessment & Plan:

## 2012-07-31 NOTE — Assessment & Plan Note (Signed)
Discussed bowel regimen.   ?

## 2012-08-04 ENCOUNTER — Other Ambulatory Visit: Payer: Self-pay | Admitting: Internal Medicine

## 2012-08-14 ENCOUNTER — Encounter: Payer: Self-pay | Admitting: Internal Medicine

## 2012-08-14 ENCOUNTER — Ambulatory Visit (INDEPENDENT_AMBULATORY_CARE_PROVIDER_SITE_OTHER): Payer: 59 | Admitting: Internal Medicine

## 2012-08-14 VITALS — BP 128/62 | HR 70 | Temp 98.2°F | Wt 165.0 lb

## 2012-08-14 DIAGNOSIS — K5900 Constipation, unspecified: Secondary | ICD-10-CM

## 2012-08-14 DIAGNOSIS — K5909 Other constipation: Secondary | ICD-10-CM

## 2012-08-14 NOTE — Assessment & Plan Note (Signed)
Better now on the miralax Still has sense of not emptying so will add the senekot regularly Probably didn't have diverticulitis (?just UTI) but off antibiotics now

## 2012-08-14 NOTE — Progress Notes (Signed)
Subjective:    Patient ID: Evan [name], male    DOB: 02-Aug-1918, 78 y.o.   MRN: [id number]  HPI Here with different friend--he won't drive this far---only locally in [facility] a lot better Bowels moving every day--very soft now Still has feeling of fullness after going though Using the miralax but not the senekot  Notices his right foot stays nuumb No pain now--did have pain before the hospital  Current Outpatient Prescriptions on File Prior to Visit  Medication Sig Dispense Refill  . aspirin 81 MG tablet Take 81 mg by mouth daily.        . BD INSULIN SYRINGE ULTRAFINE 31G X 5/16" 0.3 ML MISC       . brimonidine-timolol (COMBIGAN) 0.2-0.5 % ophthalmic solution Place 1 drop into both eyes 2 (two) times daily.        . clopidogrel (PLAVIX) 75 MG tablet Take 1 tablet (75 mg total) by mouth daily.  90 tablet  3  . gabapentin (NEURONTIN) 100 MG capsule Take 100 mg by mouth at bedtime.       Marland Kitchen glipiZIDE (GLUCOTROL XL) 2.5 MG 24 hr tablet Take 1 tablet (2.5 mg total) by mouth daily.  90 tablet  3  . glucose blood (FREESTYLE LITE) test strip Patient test blood sugar three times daily       . hydrALAZINE (APRESOLINE) 50 MG tablet Take 1 tablet (50 mg total) by mouth 4 (four) times daily.  360 tablet  3  . insulin aspart (NOVOLOG) 100 UNIT/ML injection Inject into the skin 3 (three) times daily as needed. Before meals if sugar elevated      . latanoprost (XALATAN) 0.005 % ophthalmic solution Place 1 drop into both eyes at bedtime.        Marland Kitchen lisinopril (PRINIVIL,ZESTRIL) 5 MG tablet Take 1 tablet (5 mg total) by mouth daily.  90 tablet  3  . nitroGLYCERIN (NITROSTAT) 0.4 MG SL tablet Place 0.4 mg under the tongue every 5 (five) minutes as needed.        . polyethylene glycol (MIRALAX / GLYCOLAX) packet Take 17 g by mouth daily.        . pravastatin (PRAVACHOL) 40 MG tablet Take 1 tablet (40 mg total) by mouth daily.  90 tablet  3  . predniSONE (DELTASONE) 10 MG tablet TAKE ONE TABLET BY  MOUTH EVERY OTHER DAY ALTERNATING WITH ONE-HALF TABLET, OR AS DIRECTED  90 tablet  2  . sennosides-docusate sodium (SENOKOT-S) 8.6-50 MG tablet Take 2 tablets by mouth daily.      . Tamsulosin HCl (FLOMAX) 0.4 MG CAPS Take 1 capsule (0.4 mg total) by mouth daily.  90 capsule  3  . traMADol (ULTRAM) 50 MG tablet Take 1 tablet (50 mg total) by mouth 3 (three) times daily as needed.  120 tablet  0  . verapamil (CALAN) 40 MG tablet Take 1 tablet (40 mg total) by mouth 2 (two) times daily.  180 tablet  3    Allergies  Allergen Reactions  . Penicillins     REACTION: unspecified    Past Medical History  Diagnosis Date  . CAD (coronary artery disease)   . Diabetes mellitus   . GERD (gastroesophageal reflux disease)   . Hyperlipidemia   . Arthritis   . Hypertension   . BPH (benign prostatic hypertrophy)   . Degenerative disc disease   . Polymyalgia rheumatica   . CVA (cerebral vascular accident)   . Gastroenteritis     Past  Surgical History  Procedure Date  . Cataract extraction 1999 1992    glaucoma  . Angioplasty   . Esophageal dilation   . US echocardiography 04/2004    benign    No family history on file.  History   Social History  . Marital Status: Widowed    Spouse Name: N/A    Number of Children: N/A  . Years of Education: N/A   Occupational History  . retired    Social History Main Topics  . Smoking status: Former Games developer  . Smokeless tobacco: Never Used   Comment: quit over 20 years ago  . Alcohol Use: No  . Drug Use: No  . Sexually Active: Not on file   Other Topics Concern  . Not on file   Social History Narrative   Religion affecting care--involved with Church throughout life (deacon, etc)   Review of Systems Appetite is better Weight up 3#    Objective:   Physical Exam  Cardiovascular: Intact distal pulses.   Abdominal: Soft. He exhibits no distension. There is no tenderness. There is no rebound.  Musculoskeletal: He exhibits no edema.        Good circulation in feet   Skin:       No foot lesions          Assessment & Plan:

## 2012-09-28 ENCOUNTER — Ambulatory Visit: Payer: 59 | Admitting: Internal Medicine

## 2012-11-07 LAB — HM DIABETES EYE EXAM

## 2012-11-14 ENCOUNTER — Encounter: Payer: Self-pay | Admitting: Internal Medicine

## 2012-11-14 ENCOUNTER — Ambulatory Visit (INDEPENDENT_AMBULATORY_CARE_PROVIDER_SITE_OTHER): Payer: 59 | Admitting: Internal Medicine

## 2012-11-14 VITALS — BP 140/70 | HR 60 | Temp 97.5°F | Wt 174.0 lb

## 2012-11-14 DIAGNOSIS — M353 Polymyalgia rheumatica: Secondary | ICD-10-CM

## 2012-11-14 DIAGNOSIS — I10 Essential (primary) hypertension: Secondary | ICD-10-CM

## 2012-11-14 DIAGNOSIS — N400 Enlarged prostate without lower urinary tract symptoms: Secondary | ICD-10-CM

## 2012-11-14 DIAGNOSIS — I2510 Atherosclerotic heart disease of native coronary artery without angina pectoris: Secondary | ICD-10-CM

## 2012-11-14 DIAGNOSIS — E1149 Type 2 diabetes mellitus with other diabetic neurological complication: Secondary | ICD-10-CM

## 2012-11-14 LAB — BASIC METABOLIC PANEL
CO2: 30 mEq/L (ref 19–32)
Calcium: 9.2 mg/dL (ref 8.4–10.5)
GFR: 41.54 mL/min — ABNORMAL LOW (ref >60.00)
Potassium: 4.6 mEq/L (ref 3.5–5.1)
Sodium: 141 mEq/L (ref 135–145)

## 2012-11-14 LAB — HM DIABETES FOOT EXAM

## 2012-11-14 LAB — HEMOGLOBIN A1C: Hgb A1c MFr Bld: 7.8 % — ABNORMAL HIGH (ref 4.6–6.5)

## 2012-11-14 NOTE — Assessment & Plan Note (Signed)
Voiding okay on current regimen

## 2012-11-14 NOTE — Assessment & Plan Note (Signed)
Has been quiet 

## 2012-11-14 NOTE — Assessment & Plan Note (Signed)
BP Readings from Last 3 Encounters:  11/14/12 140/70  08/14/12 128/62  07/31/12 130/60   Good control No changes needed

## 2012-11-14 NOTE — Progress Notes (Signed)
Subjective:    Patient ID: Evan [name], male    DOB: 1918/04/16, 79 y.o.   MRN: [id number]  HPI Doing well Has been eating better Has gained his weight back---snacks a lot then 1 main meal  Checking sugars daily--occ rechecks fastings usually under 150 No hypoglycemic reactions Eye exam last week  Bowels have been good Using senna leaf tea and this is helping. Still on the miralax  No chest pain No palpitations No SOB  No shoulder or hip aching Still on alternate prednisone 10/5  Current Outpatient Prescriptions on File Prior to Visit  Medication Sig Dispense Refill  . aspirin 81 MG tablet Take 81 mg by mouth daily.        . BD INSULIN SYRINGE ULTRAFINE 31G X 5/16" 0.3 ML MISC       . brimonidine-timolol (COMBIGAN) 0.2-0.5 % ophthalmic solution Place 1 drop into both eyes 2 (two) times daily.        . clopidogrel (PLAVIX) 75 MG tablet Take 1 tablet (75 mg total) by mouth daily.  90 tablet  3  . gabapentin (NEURONTIN) 100 MG capsule Take 100 mg by mouth at bedtime.       Marland Kitchen glipiZIDE (GLUCOTROL XL) 2.5 MG 24 hr tablet Take 1 tablet (2.5 mg total) by mouth daily.  90 tablet  3  . glucose blood (FREESTYLE LITE) test strip Patient test blood sugar three times daily       . hydrALAZINE (APRESOLINE) 50 MG tablet Take 1 tablet (50 mg total) by mouth 4 (four) times daily.  360 tablet  3  . insulin aspart (NOVOLOG) 100 UNIT/ML injection Inject into the skin 3 (three) times daily as needed. Before meals if sugar elevated      . latanoprost (XALATAN) 0.005 % ophthalmic solution Place 1 drop into both eyes at bedtime.        Marland Kitchen lisinopril (PRINIVIL,ZESTRIL) 5 MG tablet Take 1 tablet (5 mg total) by mouth daily.  90 tablet  3  . nitroGLYCERIN (NITROSTAT) 0.4 MG SL tablet Place 0.4 mg under the tongue every 5 (five) minutes as needed.        . polyethylene glycol (MIRALAX / GLYCOLAX) packet Take 17 g by mouth daily.        . pravastatin (PRAVACHOL) 40 MG tablet Take 1 tablet (40 mg total)  by mouth daily.  90 tablet  3  . predniSONE (DELTASONE) 10 MG tablet TAKE ONE TABLET BY MOUTH EVERY OTHER DAY ALTERNATING WITH ONE-HALF TABLET, OR AS DIRECTED  90 tablet  2  . sennosides-docusate sodium (SENOKOT-S) 8.6-50 MG tablet Take 2 tablets by mouth daily.      . Tamsulosin HCl (FLOMAX) 0.4 MG CAPS Take 1 capsule (0.4 mg total) by mouth daily.  90 capsule  3  . traMADol (ULTRAM) 50 MG tablet Take 1 tablet (50 mg total) by mouth 3 (three) times daily as needed.  120 tablet  0  . verapamil (CALAN) 40 MG tablet Take 1 tablet (40 mg total) by mouth 2 (two) times daily.  180 tablet  3    Allergies  Allergen Reactions  . Penicillins     REACTION: unspecified    Past Medical History  Diagnosis Date  . CAD (coronary artery disease)   . Diabetes mellitus   . GERD (gastroesophageal reflux disease)   . Hyperlipidemia   . Arthritis   . Hypertension   . BPH (benign prostatic hypertrophy)   . Degenerative disc disease   . Polymyalgia  rheumatica   . CVA (cerebral vascular accident)   . Gastroenteritis     Past Surgical History  Procedure Date  . Cataract extraction 1999 1992    glaucoma  . Angioplasty   . Esophageal dilation   . US echocardiography 04/2004    benign    No family history on file.  History   Social History  . Marital Status: Widowed    Spouse Name: N/A    Number of Children: N/A  . Years of Education: N/A   Occupational History  . retired    Social History Main Topics  . Smoking status: Former Games developer  . Smokeless tobacco: Never Used     Comment: quit over 20 years ago  . Alcohol Use: No  . Drug Use: No  . Sexually Active: Not on file   Other Topics Concern  . Not on file   Social History Narrative   Religion affecting care--involved with Church throughout life (deacon, etc)Has living will Requests friend Cleotilde Neer as his health care power of attorneyWould accept resuscitation attempts but no prolonged artifical ventilationNo tube feeds if  cognitively unaware   Review of Systems Usually sleeps okay---occ has bad life Voids okay--still has increased frequency Still with numbness and tingling in right foot    Objective:   Physical Exam  Constitutional: He appears well-developed and well-nourished. No distress.  Neck: Normal range of motion. Neck supple.  Cardiovascular: Normal rate, regular rhythm, normal heart sounds and intact distal pulses.  Exam reveals no gallop.   No murmur heard.      Faint pedal pulses  Pulmonary/Chest: Effort normal and breath sounds normal. No respiratory distress. He has no wheezes. He has no rales.  Musculoskeletal: He exhibits no edema and no tenderness.  Lymphadenopathy:    He has no cervical adenopathy.  Skin: No rash noted. No erythema.       No foot lesions  Psychiatric: He has a normal mood and affect. His behavior is normal.          Assessment & Plan:

## 2012-11-14 NOTE — Assessment & Plan Note (Signed)
No aching and doing well If sed rate normal, will decrease prednisone to 10mg  every other day

## 2012-11-14 NOTE — Assessment & Plan Note (Signed)
Still seems to have good control Due for labs Lab Results  Component Value Date   HGBA1C 7.1* 03/27/2012

## 2012-11-20 ENCOUNTER — Encounter: Payer: Self-pay | Admitting: *Deleted

## 2013-01-10 ENCOUNTER — Other Ambulatory Visit: Payer: Self-pay | Admitting: *Deleted

## 2013-01-10 MED ORDER — GLUCOSE BLOOD VI STRP: ORAL_STRIP | Status: DC

## 2013-03-13 ENCOUNTER — Other Ambulatory Visit: Payer: Self-pay | Admitting: Internal Medicine

## 2013-03-15 ENCOUNTER — Encounter: Payer: Self-pay | Admitting: Internal Medicine

## 2013-03-15 ENCOUNTER — Ambulatory Visit (INDEPENDENT_AMBULATORY_CARE_PROVIDER_SITE_OTHER): Payer: Medicare Other | Admitting: Internal Medicine

## 2013-03-15 VITALS — BP 148/70 | HR 73 | Temp 98.4°F | Wt 174.0 lb

## 2013-03-15 DIAGNOSIS — I10 Essential (primary) hypertension: Secondary | ICD-10-CM

## 2013-03-15 DIAGNOSIS — E785 Hyperlipidemia, unspecified: Secondary | ICD-10-CM

## 2013-03-15 DIAGNOSIS — M353 Polymyalgia rheumatica: Secondary | ICD-10-CM

## 2013-03-15 DIAGNOSIS — K5900 Constipation, unspecified: Secondary | ICD-10-CM

## 2013-03-15 DIAGNOSIS — E1149 Type 2 diabetes mellitus with other diabetic neurological complication: Secondary | ICD-10-CM

## 2013-03-15 DIAGNOSIS — N400 Enlarged prostate without lower urinary tract symptoms: Secondary | ICD-10-CM

## 2013-03-15 DIAGNOSIS — I2510 Atherosclerotic heart disease of native coronary artery without angina pectoris: Secondary | ICD-10-CM

## 2013-03-15 DIAGNOSIS — K5909 Other constipation: Secondary | ICD-10-CM

## 2013-03-15 LAB — LIPID PANEL
Cholesterol: 112 mg/dL (ref 0–200)
LDL Cholesterol: 54 mg/dL (ref 0–99)

## 2013-03-15 LAB — CBC WITH DIFFERENTIAL/PLATELET
Basophils Relative: 0.2 % (ref 0.0–3.0)
Eosinophils Relative: 0.6 % (ref 0.0–5.0)
HCT: 37.7 % — ABNORMAL LOW (ref 39.0–52.0)
Hemoglobin: 12.1 g/dL — ABNORMAL LOW (ref 13.0–17.0)
Lymphs Abs: 1.5 10*3/uL (ref 0.7–4.0)
MCV: 88.1 fl (ref 78.0–100.0)
Monocytes Absolute: 0.6 10*3/uL (ref 0.1–1.0)
Monocytes Relative: 5 % (ref 3.0–12.0)
Neutro Abs: 9.5 10*3/uL — ABNORMAL HIGH (ref 1.4–7.7)
Platelets: 182 10*3/uL (ref 150.0–400.0)
WBC: 11.7 10*3/uL — ABNORMAL HIGH (ref 4.5–10.5)

## 2013-03-15 LAB — BASIC METABOLIC PANEL
BUN: 36 mg/dL — ABNORMAL HIGH (ref 6–23)
Chloride: 105 mEq/L (ref 96–112)
Potassium: 4.2 mEq/L (ref 3.5–5.1)
Sodium: 139 mEq/L (ref 135–145)

## 2013-03-15 LAB — HEPATIC FUNCTION PANEL
ALT: 8 U/L (ref 0–53)
AST: 18 U/L (ref 0–37)
Albumin: 3.4 g/dL — ABNORMAL LOW (ref 3.5–5.2)
Alkaline Phosphatase: 59 U/L (ref 39–117)
Total Protein: 6.9 g/dL (ref 6.0–8.3)

## 2013-03-15 LAB — SEDIMENTATION RATE: Sed Rate: 24 mm/hr — ABNORMAL HIGH (ref 0–22)

## 2013-03-15 LAB — HEMOGLOBIN A1C: Hgb A1c MFr Bld: 7.3 % — ABNORMAL HIGH (ref 4.6–6.5)

## 2013-03-15 NOTE — Assessment & Plan Note (Signed)
Has slight increased DOE Echo pending No sig edema or symptoms of CHF otherwise

## 2013-03-15 NOTE — Assessment & Plan Note (Signed)
Seems to be in remission on every other day prednisone Will check sed rate

## 2013-03-15 NOTE — Assessment & Plan Note (Signed)
Mild symptoms but generally okay

## 2013-03-15 NOTE — Assessment & Plan Note (Signed)
BP Readings from Last 3 Encounters:  03/15/13 148/70  11/14/12 140/70  08/14/12 128/62   Acceptable for age, especially with instability (may be orthostatic component)

## 2013-03-15 NOTE — Progress Notes (Signed)
Subjective:    Patient ID: Evan [name], male    DOB: December 05, 1917, 79 y.o.   MRN: [id number]  HPI Driven by friend Sheryle Spray he has lost some of his coordination [employer]arey Seat in church because he felt wobbly--- even using cane Sensory loss in feet has worsened Discussed upgrading to walker  Checks sugars every morning None over 200 for some time. Averaging about 160 No hypoglycemic spells  Recent echo Hasn't heard results from Dr Juliann Pares yet Notes DOE in the morning if he doesn't sleep well Regular DOE may be some worse No chest pain Some right foot swelling---not in left Sleeps flat-no PND  Voids well Does have frequency Nocturia x 2-3  Does feel achy at times Fingers and left shoulder Not generalized like in past Still on prednisone 10 every other day  Current Outpatient Prescriptions on File Prior to Visit  Medication Sig Dispense Refill  . aspirin 81 MG tablet Take 81 mg by mouth daily.        . BD INSULIN SYRINGE ULTRAFINE 31G X 5/16" 0.3 ML MISC       . brimonidine-timolol (COMBIGAN) 0.2-0.5 % ophthalmic solution Place 1 drop into both eyes 2 (two) times daily.        . clopidogrel (PLAVIX) 75 MG tablet Take 1 tablet (75 mg total) by mouth daily.  90 tablet  3  . glipiZIDE (GLUCOTROL XL) 2.5 MG 24 hr tablet Take 1 tablet (2.5 mg total) by mouth daily.  90 tablet  3  . glucose blood (PRODIGY TEST) test strip Use as instructed to check blood sugar three times daily  100 each  6  . hydrALAZINE (APRESOLINE) 50 MG tablet Take 1 tablet (50 mg total) by mouth 4 (four) times daily.  360 tablet  3  . insulin aspart (NOVOLOG) 100 UNIT/ML injection Inject into the skin 3 (three) times daily as needed. Before meals if sugar elevated      . latanoprost (XALATAN) 0.005 % ophthalmic solution Place 1 drop into both eyes at bedtime.        Marland Kitchen lisinopril (PRINIVIL,ZESTRIL) 5 MG tablet TAKE 1 TABLET DAILY  90 tablet  2  . nitroGLYCERIN (NITROSTAT) 0.4 MG SL tablet Place 0.4 mg under the  tongue every 5 (five) minutes as needed.        . polyethylene glycol (MIRALAX / GLYCOLAX) packet Take 17 g by mouth daily.        . pravastatin (PRAVACHOL) 40 MG tablet Take 1 tablet (40 mg total) by mouth daily.  90 tablet  3  . predniSONE (DELTASONE) 10 MG tablet TAKE ONE TABLET BY MOUTH EVERY OTHER DAY ALTERNATING WITH ONE-HALF TABLET, OR AS DIRECTED  90 tablet  2  . sennosides-docusate sodium (SENOKOT-S) 8.6-50 MG tablet Take 2 tablets by mouth daily.      . tamsulosin (FLOMAX) 0.4 MG CAPS TAKE 1 CAPSULE DAILY  90 capsule  2  . traMADol (ULTRAM) 50 MG tablet Take 1 tablet (50 mg total) by mouth 3 (three) times daily as needed.  120 tablet  0  . verapamil (CALAN) 40 MG tablet TAKE 1 TABLET TWICE A DAY  180 tablet  2   No current facility-administered medications on file prior to visit.    Allergies  Allergen Reactions  . Penicillins     REACTION: unspecified    Past Medical History  Diagnosis Date  . CAD (coronary artery disease)   . Diabetes mellitus   . GERD (gastroesophageal reflux disease)   .  Hyperlipidemia   . Arthritis   . Hypertension   . BPH (benign prostatic hypertrophy)   . Degenerative disc disease   . Polymyalgia rheumatica   . CVA (cerebral vascular accident)   . Gastroenteritis     Past Surgical History  Procedure Laterality Date  . Cataract extraction  1999 1992    glaucoma  . Angioplasty    . Esophageal dilation    . US echocardiography  04/2004    benign    No family history on file.  History   Social History  . Marital Status: Widowed    Spouse Name: N/A    Number of Children: N/A  . Years of Education: N/A   Occupational History  . retired    Social History Main Topics  . Smoking status: Former Games developer  . Smokeless tobacco: Never Used     Comment: quit over 20 years ago  . Alcohol Use: No  . Drug Use: No  . Sexually Active: Not on file   Other Topics Concern  . Not on file   Social History Narrative   Religion affecting  care--involved with Church throughout life (deacon, etc)      Has living will    Requests friend Cleotilde Neer as his health care power of attorney   Would accept resuscitation attempts but no prolonged artifical ventilation   No tube feeds if cognitively unaware         Review of Systems Sleep is variable. Some bad nights Appetite remains very good Weight stable    Objective:   Physical Exam  Constitutional: He appears well-developed and well-nourished. No distress.  Neck: Normal range of motion. Neck supple. No thyromegaly present.  Cardiovascular: Normal rate, regular rhythm, normal heart sounds and intact distal pulses.  Exam reveals no gallop.   No murmur heard. Feet warm with faint pedal pulses  Pulmonary/Chest: Effort normal and breath sounds normal. No respiratory distress. He has no wheezes. He has no rales.  Musculoskeletal: He exhibits edema. He exhibits no tenderness.  Trace ankle edema  Lymphadenopathy:    He has no cervical adenopathy.  Neurological:  Decreased sensation in feet  Skin: No rash noted. No erythema.  No foot lesions  Psychiatric: He has a normal mood and affect. His behavior is normal.          Assessment & Plan:

## 2013-03-15 NOTE — Patient Instructions (Signed)
Please try using the rolling walker instead of the cane. Please use single vision lenses in your glasses and only use the reading part when you are doing up close work or reading.

## 2013-03-15 NOTE — Assessment & Plan Note (Signed)
Voids okay on the tamsulosin 

## 2013-03-15 NOTE — Assessment & Plan Note (Signed)
Due for labs

## 2013-03-15 NOTE — Assessment & Plan Note (Signed)
Seems to have acceptable control Increasing balance problems which are probably multifactorial but largely due to neuropathy Instructions given (walker, single vision glasses)

## 2013-03-18 ENCOUNTER — Encounter: Payer: Self-pay | Admitting: *Deleted

## 2013-04-27 ENCOUNTER — Other Ambulatory Visit: Payer: Self-pay | Admitting: Internal Medicine

## 2013-05-16 ENCOUNTER — Other Ambulatory Visit: Payer: Self-pay

## 2013-05-25 ENCOUNTER — Other Ambulatory Visit: Payer: Self-pay | Admitting: Internal Medicine

## 2013-06-05 ENCOUNTER — Encounter: Payer: Self-pay | Admitting: Internal Medicine

## 2013-06-05 ENCOUNTER — Ambulatory Visit (INDEPENDENT_AMBULATORY_CARE_PROVIDER_SITE_OTHER): Payer: Medicare Other | Admitting: Internal Medicine

## 2013-06-05 VITALS — BP 128/60 | HR 63 | Temp 98.1°F | Wt 168.0 lb

## 2013-06-05 DIAGNOSIS — Z0000 Encounter for general adult medical examination without abnormal findings: Secondary | ICD-10-CM

## 2013-06-05 DIAGNOSIS — I2510 Atherosclerotic heart disease of native coronary artery without angina pectoris: Secondary | ICD-10-CM

## 2013-06-05 DIAGNOSIS — K5900 Constipation, unspecified: Secondary | ICD-10-CM

## 2013-06-05 DIAGNOSIS — E1149 Type 2 diabetes mellitus with other diabetic neurological complication: Secondary | ICD-10-CM

## 2013-06-05 DIAGNOSIS — K5909 Other constipation: Secondary | ICD-10-CM

## 2013-06-05 DIAGNOSIS — M353 Polymyalgia rheumatica: Secondary | ICD-10-CM

## 2013-06-05 MED ORDER — VITAMIN D (ERGOCALCIFEROL) 1.25 MG (50000 UNIT) PO CAPS: 50000.0000 [IU] | ORAL_CAPSULE | ORAL | Status: DC

## 2013-06-05 MED ORDER — PREDNISONE 10 MG PO TABS: 10.0000 mg | ORAL_TABLET | ORAL | Status: DC

## 2013-06-05 NOTE — Assessment & Plan Note (Signed)
Stable DOE and rare angina No changes for now Sees Dr Juliann Pares still

## 2013-06-05 NOTE — Assessment & Plan Note (Signed)
Continue prednisone 10mg  every other day for now

## 2013-06-05 NOTE — Assessment & Plan Note (Signed)
Lab Results  Component Value Date   HGBA1C 7.3* 03/15/2013   Good control

## 2013-06-05 NOTE — Progress Notes (Signed)
Subjective:    Patient ID: Evan [name], male    DOB: [birth year], 79 y.o.   MRN: [id number]  HPI Here with friend Fayrene Fearing  Having trouble with bowels recently Couldn't get through here so he went to [facility] Given Fleet's enema and told to take metamucil wafers Still slow miralax not helpful after a while. MOM and dulcolax not effective either  Has had a hard time emotionally Bowel problems, appetite off, some weight loss Notes DOE---not any worse but he is limited Trouble especially bending over---like to make bed  Falls if turns to right Went back to cane----urged him to use walker  Keeps up with Dr Juliann Pares Still never heard about the echo Did have some angina when he was constipated---hasn't used NTG in a long time  Urinary frequency continues Nocturia often also  Current Outpatient Prescriptions on File Prior to Visit  Medication Sig Dispense Refill  . aspirin 81 MG tablet Take 81 mg by mouth daily.        . BD INSULIN SYRINGE ULTRAFINE 31G X 5/16" 0.3 ML MISC Inject 1 Syringe into the muscle 3 (three) times daily.       . brimonidine-timolol (COMBIGAN) 0.2-0.5 % ophthalmic solution Place 1 drop into both eyes 2 (two) times daily.        . clopidogrel (PLAVIX) 75 MG tablet TAKE 1 TABLET DAILY  90 tablet  2  . glipiZIDE (GLUCOTROL XL) 2.5 MG 24 hr tablet Take 1 tablet (2.5 mg total) by mouth daily.  90 tablet  3  . glucose blood (PRODIGY TEST) test strip Use as instructed to check blood sugar three times daily  100 each  6  . hydrALAZINE (APRESOLINE) 50 MG tablet TAKE 1 TABLET FOUR TIMES A DAY  360 tablet  2  . insulin aspart (NOVOLOG) 100 UNIT/ML injection Inject into the skin 3 (three) times daily as needed. Before meals if sugar elevated      . latanoprost (XALATAN) 0.005 % ophthalmic solution Place 1 drop into both eyes at bedtime.        Marland Kitchen lisinopril (PRINIVIL,ZESTRIL) 5 MG tablet TAKE 1 TABLET DAILY  90 tablet  2  . nitroGLYCERIN (NITROSTAT) 0.4 MG SL tablet  Place 0.4 mg under the tongue every 5 (five) minutes as needed.        . pravastatin (PRAVACHOL) 40 MG tablet TAKE 1 TABLET DAILY  90 tablet  2  . tamsulosin (FLOMAX) 0.4 MG CAPS TAKE 1 CAPSULE DAILY  90 capsule  2  . traMADol (ULTRAM) 50 MG tablet Take 1 tablet (50 mg total) by mouth 3 (three) times daily as needed.  120 tablet  0  . verapamil (CALAN) 40 MG tablet TAKE 1 TABLET TWICE A DAY  180 tablet  2   No current facility-administered medications on file prior to visit.    Allergies  Allergen Reactions  . Penicillins     REACTION: unspecified    Past Medical History  Diagnosis Date  . CAD (coronary artery disease)   . Diabetes mellitus   . GERD (gastroesophageal reflux disease)   . Hyperlipidemia   . Arthritis   . Hypertension   . BPH (benign prostatic hypertrophy)   . Degenerative disc disease   . Polymyalgia rheumatica   . CVA (cerebral vascular accident)   . Gastroenteritis     Past Surgical History  Procedure Laterality Date  . Cataract extraction  1999 1992    glaucoma  . Angioplasty    . Esophageal  dilation    . US echocardiography  04/2004    benign    No family history on file.  History   Social History  . Marital Status: Widowed    Spouse Name: N/A    Number of Children: N/A  . Years of Education: N/A   Occupational History  . retired    Social History Main Topics  . Smoking status: Former Games developer  . Smokeless tobacco: Never Used     Comment: quit over 20 years ago  . Alcohol Use: No  . Drug Use: No  . Sexually Active: Not on file   Other Topics Concern  . Not on file   Social History Narrative   Religion affecting care--involved with Church throughout life (deacon, etc)      Has living will    Requests friend Cleotilde Neer as his health care power of attorney   Would accept resuscitation attempts but no prolonged artifical ventilation   No tube feeds if cognitively unaware         Review of Systems  Constitutional: Positive for  fatigue and unexpected weight change.       Wears seat belt Only drives local Independent with instrumental ADLs but harder now  HENT: Negative for hearing loss and dental problem.        Keeps up with dentist  Eyes: Positive for visual disturbance.  Respiratory: Positive for shortness of breath. Negative for cough.   Cardiovascular: Positive for chest pain and leg swelling. Negative for palpitations.  Gastrointestinal: Positive for nausea, abdominal pain and constipation. Negative for blood in stool.  Endocrine: Positive for cold intolerance. Negative for heat intolerance.  Genitourinary: Positive for frequency and difficulty urinating.  Musculoskeletal: Negative for back pain and arthralgias.  Skin: Positive for rash.       Rosacea--has metrogel  Allergic/Immunologic: Negative for environmental allergies and immunocompromised state.  Neurological: Positive for dizziness, weakness, light-headedness and numbness. Negative for syncope.       Numbness in feet  Hematological: Negative for adenopathy. Bruises/bleeds easily.  Psychiatric/Behavioral: Positive for sleep disturbance and dysphoric mood. The patient is not nervous/anxious.        Objective:   Physical Exam  Constitutional: He is oriented to person, place, and time. He appears well-developed. No distress.  HENT:  Head: Normocephalic and atraumatic.  Right Ear: External ear normal.  Left Ear: External ear normal.  Mouth/Throat: Oropharynx is clear and moist. No oropharyngeal exudate.  Eyes: Conjunctivae and EOM are normal. Pupils are equal, round, and reactive to light.  Neck: Normal range of motion. Neck supple. No thyromegaly present.  Cardiovascular: Normal rate, regular rhythm, normal heart sounds and intact distal pulses.  Exam reveals no gallop.   No murmur heard. Pulmonary/Chest: Effort normal and breath sounds normal. No respiratory distress. He has no wheezes. He has no rales.  Abdominal: Soft. There is no  tenderness.  Musculoskeletal: He exhibits edema. He exhibits no tenderness.  Trace ankle edema  Lymphadenopathy:    He has no cervical adenopathy.  Neurological: He is alert and oriented to person, place, and time.  Skin: No rash noted. No erythema.  Psychiatric: He has a normal mood and affect. His behavior is normal.          Assessment & Plan:

## 2013-06-05 NOTE — Assessment & Plan Note (Signed)
Overall okay for age Trouble with constipation---discussed regimen Will start ergocalciferol given renal insuff and falls No cancer screening High fall risk---get rid of bifocals and go back to walker

## 2013-06-05 NOTE — Assessment & Plan Note (Signed)
Discussed regimen 

## 2013-06-05 NOTE — Patient Instructions (Addendum)
Please continue the metamucil wafers and restart the miralax 1 capful with full glass of water daily. If you haven't gone without more help, you can use milk of magnesia, dulcolax or an enema every 3 days.  Please go back to using the walker.  Please wear only single vision glasses---one for driving and regular times, and another for reading

## 2013-07-15 ENCOUNTER — Other Ambulatory Visit: Payer: Self-pay

## 2013-07-15 MED ORDER — GLIPIZIDE ER 2.5 MG PO TB24: 2.5000 mg | ORAL_TABLET | Freq: Every day | ORAL | Status: DC

## 2013-07-15 NOTE — Telephone Encounter (Signed)
Pt request 90 day refill glipizide to [facility]; advised done.

## 2013-07-16 ENCOUNTER — Ambulatory Visit: Payer: Medicare Other | Admitting: Internal Medicine

## 2013-08-27 ENCOUNTER — Other Ambulatory Visit: Payer: Self-pay | Admitting: *Deleted

## 2013-08-27 MED ORDER — GLIPIZIDE ER 2.5 MG PO TB24: 2.5000 mg | ORAL_TABLET | Freq: Every day | ORAL | Status: DC

## 2013-08-29 ENCOUNTER — Other Ambulatory Visit: Payer: Self-pay | Admitting: *Deleted

## 2013-08-29 MED ORDER — INSULIN LISPRO 100 UNIT/ML ~~LOC~~ SOLN: 2.0000 [IU] | Freq: Three times a day (TID) | SUBCUTANEOUS | Status: DC

## 2013-09-03 ENCOUNTER — Telehealth: Payer: Self-pay

## 2013-09-03 NOTE — Telephone Encounter (Signed)
Mandy with [facility] left v/m requesting status of prescription order previously faxed to Dr Alphonsus Sias for diabetic supplies. Mandy request cb.

## 2013-09-04 NOTE — Telephone Encounter (Signed)
Form done 

## 2013-09-04 NOTE — Telephone Encounter (Signed)
Form faxed back and scanned 

## 2013-09-04 NOTE — Telephone Encounter (Signed)
Form on your desk to be filled out, pt will use this company,   ( you did this in July, I attached that form for reference)

## 2013-09-18 NOTE — Telephone Encounter (Signed)
Angelica Chessman said did not receive fax and request refaxed to [phone] per [facility] request. Done.

## 2013-10-08 ENCOUNTER — Ambulatory Visit: Payer: Medicare Other | Admitting: Internal Medicine

## 2013-10-09 ENCOUNTER — Ambulatory Visit: Payer: Medicare Other | Admitting: Internal Medicine

## 2013-10-18 ENCOUNTER — Ambulatory Visit: Payer: Self-pay | Admitting: Podiatry

## 2013-10-22 ENCOUNTER — Encounter: Payer: Self-pay | Admitting: Internal Medicine

## 2013-10-22 ENCOUNTER — Ambulatory Visit (INDEPENDENT_AMBULATORY_CARE_PROVIDER_SITE_OTHER): Payer: Medicare Other | Admitting: Internal Medicine

## 2013-10-22 VITALS — BP 132/58 | Temp 96.0°F | Resp 55 | Wt 172.0 lb

## 2013-10-22 DIAGNOSIS — E1122 Type 2 diabetes mellitus with diabetic chronic kidney disease: Secondary | ICD-10-CM | POA: Insufficient documentation

## 2013-10-22 DIAGNOSIS — M353 Polymyalgia rheumatica: Secondary | ICD-10-CM

## 2013-10-22 DIAGNOSIS — E1129 Type 2 diabetes mellitus with other diabetic kidney complication: Secondary | ICD-10-CM

## 2013-10-22 DIAGNOSIS — E1142 Type 2 diabetes mellitus with diabetic polyneuropathy: Secondary | ICD-10-CM | POA: Insufficient documentation

## 2013-10-22 DIAGNOSIS — E1149 Type 2 diabetes mellitus with other diabetic neurological complication: Secondary | ICD-10-CM

## 2013-10-22 LAB — BASIC METABOLIC PANEL
CO2: 26 mEq/L (ref 19–32)
Calcium: 8.7 mg/dL (ref 8.4–10.5)
Creatinine, Ser: 1.9 mg/dL — ABNORMAL HIGH (ref 0.4–1.5)
GFR: 42.72 mL/min — ABNORMAL LOW (ref >60.00)
Potassium: 4.5 mEq/L (ref 3.5–5.1)

## 2013-10-22 LAB — HM DIABETES FOOT EXAM

## 2013-10-22 LAB — HEMOGLOBIN A1C: Hgb A1c MFr Bld: 7.5 % — ABNORMAL HIGH (ref 4.6–6.5)

## 2013-10-22 LAB — SEDIMENTATION RATE: Sed Rate: 18 mm/hr (ref 0–22)

## 2013-10-22 NOTE — Assessment & Plan Note (Signed)
Seems to still have good control May not be able to get the humalog---I don't want him to have 70/30 if that is all they approve

## 2013-10-22 NOTE — Progress Notes (Signed)
Pre-visit discussion using our clinic review tool. No additional management support is needed unless otherwise documented below in the visit note.  

## 2013-10-22 NOTE — Assessment & Plan Note (Signed)
Will recheck labs today .

## 2013-10-22 NOTE — Progress Notes (Signed)
Subjective:    Patient ID: Evan [name], male    DOB: Nov 24, 1917, 79 y.o.   MRN: [id number]  HPI Here with friend Sheryle Spray good Seems to have rallied since last time  Checks sugars every morning and occ at other times Only uses the insulin if he goes too high No hypoglycemic reactions  No chest pain Breathing has been good  Voids okay Still with increased frequency Nocturia x 3 ---stable  Still on prednisone every other day Occasional upper arm aching--nothing persistent  Current Outpatient Prescriptions on File Prior to Visit  Medication Sig Dispense Refill  . aspirin 81 MG tablet Take 81 mg by mouth daily.        . BD INSULIN SYRINGE ULTRAFINE 31G X 5/16" 0.3 ML MISC Inject 1 Syringe into the muscle 3 (three) times daily.       . brimonidine-timolol (COMBIGAN) 0.2-0.5 % ophthalmic solution Place 1 drop into both eyes 2 (two) times daily.        . clopidogrel (PLAVIX) 75 MG tablet TAKE 1 TABLET DAILY  90 tablet  2  . glipiZIDE (GLUCOTROL XL) 2.5 MG 24 hr tablet Take 1 tablet (2.5 mg total) by mouth daily.  90 tablet  3  . glucose blood (PRODIGY TEST) test strip Use as instructed to check blood sugar three times daily  100 each  6  . hydrALAZINE (APRESOLINE) 50 MG tablet TAKE 1 TABLET FOUR TIMES A DAY  360 tablet  2  . insulin lispro (HUMALOG) 100 UNIT/ML injection Inject 2 Units into the skin 3 (three) times daily before meals. Sliding scale  10 mL  1  . latanoprost (XALATAN) 0.005 % ophthalmic solution Place 1 drop into both eyes at bedtime.        Marland Kitchen lisinopril (PRINIVIL,ZESTRIL) 5 MG tablet TAKE 1 TABLET DAILY  90 tablet  2  . nitroGLYCERIN (NITROSTAT) 0.4 MG SL tablet Place 0.4 mg under the tongue every 5 (five) minutes as needed.        . pravastatin (PRAVACHOL) 40 MG tablet TAKE 1 TABLET DAILY  90 tablet  2  . predniSONE (DELTASONE) 10 MG tablet Take 1 tablet (10 mg total) by mouth every other day.  100 tablet  0  . Psyllium (METAMUCIL) WAFR Take 2 Wafers by  mouth 2 (two) times daily.      . tamsulosin (FLOMAX) 0.4 MG CAPS TAKE 1 CAPSULE DAILY  90 capsule  2  . traMADol (ULTRAM) 50 MG tablet Take 1 tablet (50 mg total) by mouth 3 (three) times daily as needed.  120 tablet  0  . verapamil (CALAN) 40 MG tablet TAKE 1 TABLET TWICE A DAY  180 tablet  2  . Vitamin D, Ergocalciferol, (DRISDOL) 50000 UNITS CAPS Take 1 capsule (50,000 Units total) by mouth every 30 (thirty) days.  12 capsule  0   No current facility-administered medications on file prior to visit.    Allergies  Allergen Reactions  . Penicillins     REACTION: unspecified    Past Medical History  Diagnosis Date  . CAD (coronary artery disease)   . Diabetes mellitus   . GERD (gastroesophageal reflux disease)   . Hyperlipidemia   . Arthritis   . Hypertension   . BPH (benign prostatic hypertrophy)   . Degenerative disc disease   . Polymyalgia rheumatica   . CVA (cerebral vascular accident)   . Gastroenteritis     Past Surgical History  Procedure Laterality Date  . Cataract  extraction  1999 1992    glaucoma  . Angioplasty    . Esophageal dilation    . US echocardiography  04/2004    benign    No family history on file.  History   Social History  . Marital Status: Widowed    Spouse Name: N/A    Number of Children: N/A  . Years of Education: N/A   Occupational History  . retired    Social History Main Topics  . Smoking status: Former Games developer  . Smokeless tobacco: Never Used     Comment: quit over 20 years ago  . Alcohol Use: No  . Drug Use: No  . Sexual Activity: Not on file   Other Topics Concern  . Not on file   Social History Narrative   Religion affecting care--involved with Church throughout life (deacon, etc)      Has living will    Requests friend Cleotilde Neer as his health care power of attorney   Would accept resuscitation attempts but no prolonged artifical ventilation   No tube feeds if cognitively unaware         Review of  Systems Sleeping well Appetite is better Weight up 4# Still involved with church    Objective:   Physical Exam  Constitutional: He appears well-developed and well-nourished. No distress.  Neck: Normal range of motion. Neck supple. No thyromegaly present.  Cardiovascular: Normal rate, regular rhythm, normal heart sounds and intact distal pulses.  Exam reveals no gallop.   No murmur heard. Pulmonary/Chest: Effort normal and breath sounds normal. No respiratory distress. He has no wheezes. He has no rales.  Musculoskeletal: He exhibits no edema and no tenderness.  Lymphadenopathy:    He has no cervical adenopathy.  Neurological:  Sensation intact to fine touch in feet  Skin:  No foot lesions  Psychiatric: He has a normal mood and affect. His behavior is normal.          Assessment & Plan:

## 2013-10-22 NOTE — Assessment & Plan Note (Signed)
Has been stable Will hold off on renal referral

## 2013-10-22 NOTE — Assessment & Plan Note (Signed)
Mild symptoms only 

## 2013-10-22 NOTE — Assessment & Plan Note (Signed)
At most mild symptoms Will continue every other day prednisone

## 2013-10-23 ENCOUNTER — Encounter: Payer: Self-pay | Admitting: *Deleted

## 2013-10-25 ENCOUNTER — Ambulatory Visit (INDEPENDENT_AMBULATORY_CARE_PROVIDER_SITE_OTHER): Payer: Medicare Other | Admitting: Podiatry

## 2013-10-25 ENCOUNTER — Encounter: Payer: Self-pay | Admitting: Podiatry

## 2013-10-25 VITALS — BP 143/65 | HR 78 | Resp 14

## 2013-10-25 DIAGNOSIS — B351 Tinea unguium: Secondary | ICD-10-CM

## 2013-10-25 DIAGNOSIS — M79609 Pain in unspecified limb: Secondary | ICD-10-CM

## 2013-10-25 NOTE — Progress Notes (Signed)
   Subjective:    Patient ID: Evan [name], male    DOB: 20-Dec-1917, 79 y.o.   MRN: [id number]  HPI Comments: Just the toenails     Review of Systems     Objective:   Physical Exam        Assessment & Plan:

## 2013-10-25 NOTE — Progress Notes (Signed)
Subjective:     Patient ID: Evan [name], male   DOB: 1918/05/02, 79 y.o.   MRN: [id number]  HPI patient presents with nail disease in thickness that are painful 1-5 both feet   Review of Systems     Objective:   Physical Exam Neurovascular status unchanged with thick painful nailbeds better sore the corners 1-5 both feet    Assessment:     Mycotic nail infection with pain 1-5 both feet    Plan:     Debridement painful nailbeds 1-5 both feet with no iatrogenic bleeding noted

## 2013-11-15 ENCOUNTER — Other Ambulatory Visit: Payer: Self-pay | Admitting: Internal Medicine

## 2013-11-25 ENCOUNTER — Encounter: Payer: Self-pay | Admitting: Internal Medicine

## 2013-11-25 ENCOUNTER — Ambulatory Visit (INDEPENDENT_AMBULATORY_CARE_PROVIDER_SITE_OTHER)
Admission: RE | Admit: 2013-11-25 | Discharge: 2013-11-25 | Disposition: A | Discharge status: Home / Self Care | Payer: Medicare Other | Source: Ambulatory Visit | Attending: Internal Medicine | Admitting: Internal Medicine

## 2013-11-25 ENCOUNTER — Ambulatory Visit (INDEPENDENT_AMBULATORY_CARE_PROVIDER_SITE_OTHER): Payer: Medicare Other | Admitting: Internal Medicine

## 2013-11-25 VITALS — BP 172/78 | HR 75 | Temp 98.4°F | Wt 161.0 lb

## 2013-11-25 DIAGNOSIS — R0602 Shortness of breath: Secondary | ICD-10-CM | POA: Insufficient documentation

## 2013-11-25 MED ORDER — FLUTICASONE PROPIONATE 50 MCG/ACT NA SUSP
2.0000 | Freq: Every day | NASAL | Status: DC
Start: 2013-11-25 — End: 2014-10-15

## 2013-11-25 MED ORDER — LEVOFLOXACIN 500 MG PO TABS: 500.0000 mg | ORAL_TABLET | Freq: Every day | ORAL | Status: DC

## 2013-11-25 NOTE — Patient Instructions (Signed)
Call if you are not feeling better within 2-3 days. Stop the afrin completely.

## 2013-11-25 NOTE — Progress Notes (Signed)
Pre-visit discussion using our clinic review tool. No additional management support is needed unless otherwise documented below in the visit note.  

## 2013-11-25 NOTE — Progress Notes (Signed)
Subjective:    Patient ID: Evan [name], male    DOB: 09/17/1918, 80 y.o.   MRN: [id number]  HPI Here with niece, June  Started with film on tongue last week Noted BP up yesterday Has phlegm in throat---"won't go down"  Has been woozy --balance off Sugars have been up some  Has been using different meds for his congestion-- afrin, methol topically, other nasal inhaler No pills though Left side is stopped up, right is running clear  Some cough--but clearing his throat Some SOB Got sweat last night  No chest pain, just slight vague discomfort across chest No palpitations  Some ankle and foot swelling  Current Outpatient Prescriptions on File Prior to Visit  Medication Sig Dispense Refill  . aspirin 81 MG tablet Take 81 mg by mouth daily.        . BD INSULIN SYRINGE ULTRAFINE 31G X 5/16" 0.3 ML MISC Inject 1 Syringe into the muscle 3 (three) times daily.       . brimonidine-timolol (COMBIGAN) 0.2-0.5 % ophthalmic solution Place 1 drop into both eyes 2 (two) times daily.        . clopidogrel (PLAVIX) 75 MG tablet TAKE 1 TABLET DAILY  90 tablet  2  . glipiZIDE (GLUCOTROL XL) 2.5 MG 24 hr tablet Take 1 tablet (2.5 mg total) by mouth daily.  90 tablet  3  . glucose blood (PRODIGY TEST) test strip Use as instructed to check blood sugar three times daily  100 each  6  . hydrALAZINE (APRESOLINE) 50 MG tablet TAKE 1 TABLET FOUR TIMES A DAY  360 tablet  2  . insulin lispro (HUMALOG) 100 UNIT/ML injection Inject 2 Units into the skin 3 (three) times daily before meals. Sliding scale  10 mL  1  . latanoprost (XALATAN) 0.005 % ophthalmic solution Place 1 drop into both eyes at bedtime.        Marland Kitchen. lisinopril (PRINIVIL,ZESTRIL) 5 MG tablet TAKE 1 TABLET DAILY  90 tablet  2  . nitroGLYCERIN (NITROSTAT) 0.4 MG SL tablet Place 0.4 mg under the tongue every 5 (five) minutes as needed.        . Polyethylene Glycol 3350 (MIRALAX PO) Take by mouth.      . pravastatin (PRAVACHOL) 40 MG tablet TAKE 1  TABLET DAILY  90 tablet  2  . predniSONE (DELTASONE) 10 MG tablet Take 1 tablet (10 mg total) by mouth every other day.  100 tablet  0  . Psyllium (METAMUCIL) WAFR Take 2 Wafers by mouth 2 (two) times daily.      . tamsulosin (FLOMAX) 0.4 MG CAPS TAKE 1 CAPSULE DAILY  90 capsule  2  . traMADol (ULTRAM) 50 MG tablet Take 1 tablet (50 mg total) by mouth 3 (three) times daily as needed.  120 tablet  0  . verapamil (CALAN) 40 MG tablet TAKE 1 TABLET TWICE A DAY  180 tablet  1  . Vitamin D, Ergocalciferol, (DRISDOL) 50000 UNITS CAPS Take 1 capsule (50,000 Units total) by mouth every 30 (thirty) days.  12 capsule  0   No current facility-administered medications on file prior to visit.    Allergies  Allergen Reactions  . Penicillins     REACTION: unspecified    Past Medical History  Diagnosis Date  . CAD (coronary artery disease)   . Diabetes mellitus   . GERD (gastroesophageal reflux disease)   . Hyperlipidemia   . Arthritis   . Hypertension   . BPH (benign prostatic  hypertrophy)   . Degenerative disc disease   . Polymyalgia rheumatica   . CVA (cerebral vascular accident)   . Gastroenteritis     Past Surgical History  Procedure Laterality Date  . Cataract extraction  1999 1992    glaucoma  . Angioplasty    . Esophageal dilation    . US echocardiography  04/2004    benign    No family history on file.  History   Social History  . Marital Status: Widowed    Spouse Name: N/A    Number of Children: N/A  . Years of Education: N/A   Occupational History  . retired    Social History Main Topics  . Smoking status: Former Games developer  . Smokeless tobacco: Never Used     Comment: quit over 20 years ago  . Alcohol Use: No  . Drug Use: No  . Sexual Activity: Not on file   Other Topics Concern  . Not on file   Social History Narrative   Religion affecting care--involved with Church throughout life (deacon, etc)      Has living will    Requests friend Cleotilde Neer as  his health care power of attorney   Would accept resuscitation attempts but no prolonged artifical ventilation   No tube feeds if cognitively unaware         Review of Systems Foot numbness is worse Appetite is "pretty good" Weight is back down--- doesn't eat well when he doesn't feel good (gets MOW but not eating much more)    Objective:   Physical Exam  Constitutional: He appears well-developed. No distress.  Looks mildly ill  HENT:  Mouth/Throat: Oropharynx is clear and moist. No oropharyngeal exudate.  TMs normal Obstructed left nare with inflammation Right is mildly congested Slight pharyngeal injection  Neck: Normal range of motion. Neck supple. No thyromegaly present.  Cardiovascular: Normal rate, regular rhythm and normal heart sounds.  Exam reveals no gallop.   No murmur heard. Pulmonary/Chest: No respiratory distress. He has no wheezes.  Right basilar crackles  Musculoskeletal: He exhibits no edema.  Lymphadenopathy:    He has no cervical adenopathy.          Assessment & Plan:

## 2013-11-25 NOTE — Assessment & Plan Note (Addendum)
Feels it is mostly from nasal congestion But now with systemic symptoms  CXR doesn't appear to show pneumonia I will treat with levaquin due to my concerns about the sweats and dyspnea Fluticasone nasal for nose

## 2013-12-04 ENCOUNTER — Telehealth: Payer: Self-pay | Admitting: *Deleted

## 2013-12-04 NOTE — Telephone Encounter (Signed)
Called pt to ask him to stop taking plavix, because it's interfering with the aspirn. ( see scanned form )

## 2013-12-19 ENCOUNTER — Other Ambulatory Visit: Payer: Self-pay | Admitting: Internal Medicine

## 2013-12-26 ENCOUNTER — Other Ambulatory Visit: Payer: Self-pay | Admitting: Internal Medicine

## 2014-01-08 ENCOUNTER — Other Ambulatory Visit: Payer: Self-pay | Admitting: *Deleted

## 2014-01-08 MED ORDER — PREDNISONE 10 MG PO TABS: 10.0000 mg | ORAL_TABLET | ORAL | Status: DC

## 2014-01-13 ENCOUNTER — Telehealth: Payer: Self-pay | Admitting: Internal Medicine

## 2014-01-13 DIAGNOSIS — H9209 Otalgia, unspecified ear: Secondary | ICD-10-CM

## 2014-01-13 NOTE — Telephone Encounter (Signed)
Pt states he is still having issues w/his left ear and w/his throat.  He is requesting a referral to an ENT.  Can you place a referral or does pt need to come in to see you? Thank you.

## 2014-01-17 ENCOUNTER — Ambulatory Visit: Payer: Medicare Other | Admitting: Podiatry

## 2014-01-28 ENCOUNTER — Ambulatory Visit (INDEPENDENT_AMBULATORY_CARE_PROVIDER_SITE_OTHER): Payer: Medicare Other | Admitting: Podiatry

## 2014-01-28 VITALS — BP 92/49 | HR 66 | Resp 16

## 2014-01-28 DIAGNOSIS — B351 Tinea unguium: Secondary | ICD-10-CM

## 2014-01-28 DIAGNOSIS — M79609 Pain in unspecified limb: Secondary | ICD-10-CM

## 2014-01-29 NOTE — Progress Notes (Signed)
Subjective:     Patient ID: Evan [name], male   DOB: 09/20/1918, 80 y.o.   MRN: [id number]  HPI patient presents with nail disease 1-5 both feet that he cannot take care of himself   Review of Systems     Objective:   Physical Exam Thick brittle toenails 1-5 both feet that are painful when pressed dorsally    Assessment:     Mycotic nail infection with pain 1-5 both feet    Plan:     Debridement painful nailbeds 1-5 both feet with no bleeding noted

## 2014-02-10 ENCOUNTER — Other Ambulatory Visit: Payer: Self-pay | Admitting: *Deleted

## 2014-02-10 NOTE — Telephone Encounter (Signed)
Faxed refill request. Last CPE 06/05/13.  Last filled:  100 tablets  0 RF on 01/08/2014

## 2014-02-10 NOTE — Telephone Encounter (Signed)
Shouldn't be due yet but okay #100 x 2

## 2014-02-11 MED ORDER — PREDNISONE 10 MG PO TABS: 10.0000 mg | ORAL_TABLET | ORAL | Status: DC

## 2014-02-11 NOTE — Telephone Encounter (Signed)
rx sent to pharmacy by e-script  

## 2014-03-21 ENCOUNTER — Other Ambulatory Visit: Payer: Self-pay | Admitting: *Deleted

## 2014-03-21 MED ORDER — PRAVASTATIN SODIUM 40 MG PO TABS: 40.0000 mg | ORAL_TABLET | Freq: Every day | ORAL | Status: DC

## 2014-04-15 LAB — HM DIABETES EYE EXAM

## 2014-04-21 ENCOUNTER — Other Ambulatory Visit: Payer: Self-pay | Admitting: *Deleted

## 2014-04-21 ENCOUNTER — Ambulatory Visit (INDEPENDENT_AMBULATORY_CARE_PROVIDER_SITE_OTHER): Payer: Medicare Other | Admitting: Family Medicine

## 2014-04-21 ENCOUNTER — Encounter: Payer: Self-pay | Admitting: Family Medicine

## 2014-04-21 VITALS — BP 166/66 | HR 66 | Temp 98.0°F | Wt 168.8 lb

## 2014-04-21 DIAGNOSIS — I10 Essential (primary) hypertension: Secondary | ICD-10-CM

## 2014-04-21 MED ORDER — HYDRALAZINE HCL 50 MG PO TABS: ORAL_TABLET | ORAL | Status: DC

## 2014-04-21 MED ORDER — TRAMADOL HCL 50 MG PO TABS: 50.0000 mg | ORAL_TABLET | Freq: Three times a day (TID) | ORAL | Status: DC | PRN

## 2014-04-21 MED ORDER — PREDNISONE 10 MG PO TABS: 10.0000 mg | ORAL_TABLET | ORAL | Status: DC

## 2014-04-21 NOTE — Patient Instructions (Signed)
Cut the hydralazine back to 25mg  (1/2 tab) 4 times a day.  You should feel better with that.  Give Dr. Alphonsus SiasLetvak an update next week.  Take care.  Glad to see you.

## 2014-04-21 NOTE — Telephone Encounter (Signed)
Ok to phone in tramadol 

## 2014-04-21 NOTE — Progress Notes (Signed)
Pre visit review using our clinic review tool, if applicable. No additional management support is needed unless otherwise documented below in the visit note.  H/o DM2 (controlled by last A1c) and PMR.   Phlegm and tongue irritation.  Has to use a tongue scraper.  Not acute, going on for weeks to months.   Complains of "dizzy" sensation 5 days ago.  Occ since then.  Felt weak and lightheaded, no sensation of true vertigo.  Didn't fall.  No true weakness, ie no sx typical for a CVA.  Worse right after standing, not when rolling over in the bed.  It doesn't bother him getting in the bed, but notes it getting out of the bed.  Self resolves.    Meds, vitals, and allergies reviewed.   ROS: See HPI.  Otherwise, noncontributory.  nad ncat Mmm rrr ctab abd soft, not ttp Ext w/o edema Sx return on standing, brief episode, resolves.  Happens w/o head turning.  No sx when sitting back down.

## 2014-04-21 NOTE — Telephone Encounter (Signed)
Rx called in to requested pharmacy 

## 2014-04-21 NOTE — Telephone Encounter (Signed)
Pt requesting medication refill. Last f/u ov 10/2013 with upcoming  f/u appt 04/2014. pls advise

## 2014-04-21 NOTE — Assessment & Plan Note (Signed)
With likely orthostatic sx.  He may need permissive hypertension and may need less BP medicine as his pred is tapered. Would cut hydralazine to 25mg  QID and report back as needed.  We didn't address the sinus congestion as we needed to address the BP issues first.  He agrees.  Routed to PCP as FYI.

## 2014-04-25 ENCOUNTER — Encounter: Payer: Self-pay | Admitting: Podiatry

## 2014-04-25 ENCOUNTER — Ambulatory Visit (INDEPENDENT_AMBULATORY_CARE_PROVIDER_SITE_OTHER): Payer: Medicare Other | Admitting: Podiatry

## 2014-04-25 DIAGNOSIS — M79609 Pain in unspecified limb: Secondary | ICD-10-CM

## 2014-04-25 DIAGNOSIS — M79673 Pain in unspecified foot: Secondary | ICD-10-CM

## 2014-04-25 DIAGNOSIS — B351 Tinea unguium: Secondary | ICD-10-CM

## 2014-04-25 NOTE — Progress Notes (Signed)
Subjective:     Patient ID: Evan [name], male   DOB: 02/25/1918, 80 y.o.   MRN: [id number]  HPI patient presents with thick nailbeds 1-5 both feet that are painful and he cannot cut   Review of Systems     Objective:   Physical Exam Neurovascular status unchanged with thick yellow nailbeds 1-5 both feet that are painful when pressed    Assessment:     Mycotic nail infection is with pain 1-5 both feet    Plan:     Debris painful nailbeds 1-5 both feet with no iatrogenic bleeding noted

## 2014-04-29 ENCOUNTER — Ambulatory Visit (INDEPENDENT_AMBULATORY_CARE_PROVIDER_SITE_OTHER): Payer: Medicare Other | Admitting: Internal Medicine

## 2014-04-29 ENCOUNTER — Encounter: Payer: Self-pay | Admitting: Internal Medicine

## 2014-04-29 VITALS — BP 140/60 | HR 67 | Temp 97.5°F | Wt 169.0 lb

## 2014-04-29 DIAGNOSIS — I2510 Atherosclerotic heart disease of native coronary artery without angina pectoris: Secondary | ICD-10-CM

## 2014-04-29 DIAGNOSIS — N183 Chronic kidney disease, stage 3 unspecified: Secondary | ICD-10-CM

## 2014-04-29 DIAGNOSIS — E1149 Type 2 diabetes mellitus with other diabetic neurological complication: Secondary | ICD-10-CM

## 2014-04-29 DIAGNOSIS — E1129 Type 2 diabetes mellitus with other diabetic kidney complication: Secondary | ICD-10-CM

## 2014-04-29 DIAGNOSIS — I10 Essential (primary) hypertension: Secondary | ICD-10-CM

## 2014-04-29 DIAGNOSIS — M353 Polymyalgia rheumatica: Secondary | ICD-10-CM

## 2014-04-29 LAB — LIPID PANEL
CHOLESTEROL: 121 mg/dL (ref 0–200)
HDL: 42.7 mg/dL (ref >39.00)
LDL Cholesterol: 65 mg/dL (ref 0–99)
NonHDL: 78.3
TRIGLYCERIDES: 65 mg/dL (ref 0.0–149.0)
Total CHOL/HDL Ratio: 3
VLDL: 13 mg/dL (ref 0.0–40.0)

## 2014-04-29 LAB — COMPREHENSIVE METABOLIC PANEL
ALBUMIN: 3.4 g/dL — AB (ref 3.5–5.2)
ALT: 12 U/L (ref 0–53)
AST: 23 U/L (ref 0–37)
Alkaline Phosphatase: 57 U/L (ref 39–117)
BUN: 35 mg/dL — ABNORMAL HIGH (ref 6–23)
CALCIUM: 8.8 mg/dL (ref 8.4–10.5)
CO2: 28 meq/L (ref 19–32)
Chloride: 105 mEq/L (ref 96–112)
Creatinine, Ser: 1.9 mg/dL — ABNORMAL HIGH (ref 0.4–1.5)
GFR: 41.9 mL/min — AB (ref >60.00)
GLUCOSE: 209 mg/dL — AB (ref 70–99)
POTASSIUM: 4.2 meq/L (ref 3.5–5.1)
Sodium: 140 mEq/L (ref 135–145)
Total Bilirubin: 0.8 mg/dL (ref 0.2–1.2)
Total Protein: 7 g/dL (ref 6.0–8.3)

## 2014-04-29 LAB — HEMOGLOBIN A1C: HEMOGLOBIN A1C: 7.1 % — AB (ref 4.6–6.5)

## 2014-04-29 LAB — CBC WITH DIFFERENTIAL/PLATELET
Basophils Absolute: 0 10*3/uL (ref 0.0–0.1)
Basophils Relative: 0.3 % (ref 0.0–3.0)
EOS ABS: 0.6 10*3/uL (ref 0.0–0.7)
EOS PCT: 7.1 % — AB (ref 0.0–5.0)
HCT: 36.5 % — ABNORMAL LOW (ref 39.0–52.0)
Hemoglobin: 11.7 g/dL — ABNORMAL LOW (ref 13.0–17.0)
LYMPHS PCT: 15.2 % (ref 12.0–46.0)
Lymphs Abs: 1.4 10*3/uL (ref 0.7–4.0)
MCHC: 32 g/dL (ref 30.0–36.0)
MCV: 89.4 fl (ref 78.0–100.0)
Monocytes Absolute: 0.6 10*3/uL (ref 0.1–1.0)
Monocytes Relative: 6.8 % (ref 3.0–12.0)
NEUTROS PCT: 70.6 % (ref 43.0–77.0)
Neutro Abs: 6.4 10*3/uL (ref 1.4–7.7)
Platelets: 163 10*3/uL (ref 150.0–400.0)
RBC: 4.09 Mil/uL — AB (ref 4.22–5.81)
RDW: 12.3 % (ref 11.5–15.5)
WBC: 9 10*3/uL (ref 4.0–10.5)

## 2014-04-29 LAB — TSH: TSH: 1.91 u[IU]/mL (ref 0.35–4.50)

## 2014-04-29 LAB — T4, FREE: Free T4: 0.92 ng/dL (ref 0.60–1.60)

## 2014-04-29 LAB — SEDIMENTATION RATE: SED RATE: 35 mm/h — AB (ref 0–22)

## 2014-04-29 MED ORDER — PRAVASTATIN SODIUM 40 MG PO TABS: 40.0000 mg | ORAL_TABLET | Freq: Every day | ORAL | Status: DC

## 2014-04-29 MED ORDER — VITAMIN D (ERGOCALCIFEROL) 1.25 MG (50000 UNIT) PO CAPS: 50000.0000 [IU] | ORAL_CAPSULE | ORAL | Status: DC

## 2014-04-29 NOTE — Assessment & Plan Note (Signed)
On appropriate regimen Occasional angina but no nitro needed

## 2014-04-29 NOTE — Assessment & Plan Note (Signed)
BP Readings from Last 3 Encounters:  04/29/14 140/60  04/21/14 166/66  01/28/14 92/49   orthostasis better with decrease hydralazine

## 2014-04-29 NOTE — Assessment & Plan Note (Signed)
Controlled with prednisone Due for labs

## 2014-04-29 NOTE — Assessment & Plan Note (Signed)
occ angina Slight increase in DOE---discussed muscle strengthening

## 2014-04-29 NOTE — Assessment & Plan Note (Signed)
Will recheck labs On ACEI 

## 2014-04-29 NOTE — Progress Notes (Signed)
Pre visit review using our clinic review tool, if applicable. No additional management support is needed unless otherwise documented below in the visit note. 

## 2014-04-29 NOTE — Progress Notes (Signed)
Subjective:    Patient ID: Evan [name], male    DOB: 09/18/1918, 80 y.o.   MRN: [id number]  HPI Here with friend, Evan [name] not feeling great but orthostatic symptoms seem better [name] has to be careful when he stands up Balance is off some--no falls. Does use cane Discussed muscle strengthening  Saw Dr Evan [name]--has drying from meds Using saline for this  Right olecranon bursitis Aspirated twice by ortho--doing better  Diabetes control [name] good Checks every morning-- 110-150 No low sugar reactions No foot sores. [name] with numbness but no sig pain  Only mild muscle soreness--mostly in shoulders [name] on prednisone 10 every other day  Gets occasional chest pain Hasn't needed nitro Breathing is short with exertion at times--some progression Mild edema--better lately  Current Outpatient Prescriptions on File Prior to Visit  Medication Sig Dispense Refill  . aspirin 81 MG tablet Take 81 mg by mouth daily.        . BD INSULIN SYRINGE ULTRAFINE 31G X 5/16" 0.3 ML MISC Inject 1 Syringe into the muscle 3 (three) times daily.       . bimatoprost (LUMIGAN) 0.03 % ophthalmic solution Place 1 drop into both eyes at bedtime.      . brimonidine-timolol (COMBIGAN) 0.2-0.5 % ophthalmic solution Place 1 drop into both eyes 2 (two) times daily.        . fluticasone (FLONASE) 50 MCG/ACT nasal spray Place 2 sprays into both nostrils daily. In each nostril  16 g  12  . glipiZIDE (GLUCOTROL XL) 2.5 MG 24 hr tablet Take 1 tablet (2.5 mg total) by mouth daily.  90 tablet  3  . glucose blood (PRODIGY TEST) test strip Use as instructed to check blood sugar three times daily  100 each  6  . hydrALAZINE (APRESOLINE) 50 MG tablet TAKE 0.5 TABLET FOUR TIMES A DAY      . insulin lispro (HUMALOG) 100 UNIT/ML injection Inject 2 Units into the skin 3 (three) times daily before meals. Sliding scale  10 mL  1  . lisinopril (PRINIVIL,ZESTRIL) 5 MG tablet TAKE 1 TABLET DAILY  90 tablet  1  .  nitroGLYCERIN (NITROSTAT) 0.4 MG SL tablet Place 0.4 mg under the tongue every 5 (five) minutes as needed.        . Polyethylene Glycol 3350 (MIRALAX PO) Take by mouth.      . pravastatin (PRAVACHOL) 40 MG tablet Take 1 tablet (40 mg total) by mouth daily.  90 tablet  2  . predniSONE (DELTASONE) 10 MG tablet Take 1 tablet (10 mg total) by mouth every other day.  100 tablet  0  . Psyllium (METAMUCIL) WAFR Take 2 Wafers by mouth 2 (two) times daily.      . tamsulosin (FLOMAX) 0.4 MG CAPS capsule TAKE 1 CAPSULE DAILY  90 capsule  1  . traMADol (ULTRAM) 50 MG tablet Take 1 tablet (50 mg total) by mouth 3 (three) times daily as needed.  120 tablet  0  . verapamil (CALAN) 40 MG tablet TAKE 1 TABLET TWICE A DAY  180 tablet  1  . Vitamin D, Ergocalciferol, (DRISDOL) 50000 UNITS CAPS Take 1 capsule (50,000 Units total) by mouth every 30 (thirty) days.  12 capsule  0   No current facility-administered medications on file prior to visit.    Allergies  Allergen Reactions  . Penicillins     REACTION: unspecified    Past Medical History  Diagnosis Date  . CAD (coronary artery disease)   .  Diabetes mellitus   . GERD (gastroesophageal reflux disease)   . Hyperlipidemia   . Arthritis   . Hypertension   . BPH (benign prostatic hypertrophy)   . Degenerative disc disease   . Polymyalgia rheumatica   . CVA (cerebral vascular accident)   . Gastroenteritis   . Chronic kidney disease, stage III (moderate)     Past Surgical History  Procedure Laterality Date  . Cataract extraction  1999 1992    glaucoma  . Angioplasty    . Esophageal dilation    . [place] echocardiography  04/2004    benign    No family history on file.  History   Social History  . Marital Status: Widowed    Spouse Name: N/A    Number of Children: N/A  . Years of Education: N/A   Occupational History  . retired    Social History Main Topics  . Smoking status: Former Games developermoker  . Smokeless tobacco: Never Used     Comment:  quit over 20 years ago  . Alcohol Use: No  . Drug Use: No  . Sexual Activity: Not on file   Other Topics Concern  . Not on file   Social History Narrative   Religion affecting care--involved with Church throughout life (deacon, etc)      Has living will    Requests friend Evan [name] as his health care power of attorney   Would accept resuscitation attempts but no prolonged artifical ventilation   No tube feeds if cognitively unaware         Review of Systems Weight stable Sleep is variable--occasional daytime somnolence Appetite is good Mood is good Only drives to pharmacy or local store. Does all other instrumental ADLs     Objective:   Physical Exam  Constitutional: He appears well-developed and well-nourished. No distress.  Neck: Normal range of motion. Neck supple. No thyromegaly present.  Cardiovascular: Normal rate, regular rhythm, normal heart sounds and intact distal pulses.  Exam reveals no gallop.   No murmur heard. Pulmonary/Chest: Effort normal and breath sounds normal. No respiratory distress. He has no wheezes. He has no rales.  Musculoskeletal: He exhibits no edema and no tenderness.  Lymphadenopathy:    He has no cervical adenopathy.  Neurological:  Decreased sensation in feet  Skin: No rash noted. No erythema.  No foot lesions  Psychiatric: He has a normal mood and affect. His behavior is normal.          Assessment & Plan:

## 2014-04-29 NOTE — Assessment & Plan Note (Signed)
Still seems to have good control Due for labs 

## 2014-04-29 NOTE — Assessment & Plan Note (Signed)
No sig pain 

## 2014-04-30 ENCOUNTER — Telehealth: Payer: Self-pay | Admitting: Internal Medicine

## 2014-04-30 ENCOUNTER — Encounter: Payer: Self-pay | Admitting: *Deleted

## 2014-04-30 NOTE — Telephone Encounter (Signed)
Relevant patient education mailed to patient.  

## 2014-05-25 ENCOUNTER — Other Ambulatory Visit: Payer: Self-pay | Admitting: Internal Medicine

## 2014-05-31 ENCOUNTER — Inpatient Hospital Stay: Payer: Self-pay | Admitting: Internal Medicine

## 2014-05-31 LAB — HEPATIC FUNCTION PANEL A (ARMC)
ALT: 14 U/L
Albumin: 2.8 g/dL — ABNORMAL LOW (ref 3.4–5.0)
Alkaline Phosphatase: 68 U/L
BILIRUBIN TOTAL: 1.1 mg/dL — AB (ref 0.2–1.0)
Bilirubin, Direct: 0.2 mg/dL (ref 0.00–0.20)
SGOT(AST): 20 U/L (ref 15–37)
Total Protein: 7.2 g/dL (ref 6.4–8.2)

## 2014-05-31 LAB — CBC WITH DIFFERENTIAL/PLATELET
Basophil #: 0.2 10*3/uL — ABNORMAL HIGH (ref 0.0–0.1)
Basophil %: 0.9 %
Eosinophil #: 0.3 10*3/uL (ref 0.0–0.7)
Eosinophil %: 1.7 %
HCT: 36.6 % — ABNORMAL LOW (ref 40.0–52.0)
HGB: 11.5 g/dL — ABNORMAL LOW (ref 13.0–18.0)
Lymphocyte #: 1.3 10*3/uL (ref 1.0–3.6)
Lymphocyte %: 7.1 %
MCH: 28 pg (ref 26.0–34.0)
MCHC: 31.4 g/dL — AB (ref 32.0–36.0)
MCV: 89 fL (ref 80–100)
Monocyte #: 1.5 x10 3/mm — ABNORMAL HIGH (ref 0.2–1.0)
Monocyte %: 8.3 %
NEUTROS ABS: 14.5 10*3/uL — AB (ref 1.4–6.5)
Neutrophil %: 82 %
Platelet: 169 10*3/uL (ref 150–440)
RBC: 4.11 10*6/uL — ABNORMAL LOW (ref 4.40–5.90)
RDW: 12 % (ref 11.5–14.5)
WBC: 17.6 10*3/uL — AB (ref 3.8–10.6)

## 2014-05-31 LAB — BASIC METABOLIC PANEL
ANION GAP: 7 (ref 7–16)
BUN: 29 mg/dL — AB (ref 7–18)
Calcium, Total: 8.1 mg/dL — ABNORMAL LOW (ref 8.5–10.1)
Chloride: 107 mmol/L (ref 98–107)
Co2: 26 mmol/L (ref 21–32)
Creatinine: 1.8 mg/dL — ABNORMAL HIGH (ref 0.60–1.30)
GFR CALC AF AMER: 37 — AB
GFR CALC NON AF AMER: 32 — AB
Glucose: 168 mg/dL — ABNORMAL HIGH (ref 65–99)
Osmolality: 289 (ref 275–301)
Potassium: 3.9 mmol/L (ref 3.5–5.1)
SODIUM: 140 mmol/L (ref 136–145)

## 2014-05-31 LAB — TROPONIN I: Troponin-I: <0.02 ng/mL

## 2014-05-31 LAB — URINALYSIS, COMPLETE
Bilirubin,UR: NEGATIVE
GLUCOSE, UR: NEGATIVE mg/dL (ref 0–75)
KETONE: NEGATIVE
NITRITE: NEGATIVE
PH: 5 (ref 4.5–8.0)
RBC,UR: 23 /HPF (ref 0–5)
Specific Gravity: 1.014 (ref 1.003–1.030)
Squamous Epithelial: <1

## 2014-05-31 LAB — LIPASE, BLOOD: LIPASE: 113 U/L (ref 73–393)

## 2014-06-01 ENCOUNTER — Other Ambulatory Visit: Payer: Self-pay | Admitting: Internal Medicine

## 2014-06-01 LAB — COMPREHENSIVE METABOLIC PANEL
ALK PHOS: 62 U/L
ALT: 14 U/L
AST: 19 U/L (ref 15–37)
Albumin: 2.3 g/dL — ABNORMAL LOW (ref 3.4–5.0)
Anion Gap: 6 — ABNORMAL LOW (ref 7–16)
BILIRUBIN TOTAL: 1.1 mg/dL — AB (ref 0.2–1.0)
BUN: 26 mg/dL — ABNORMAL HIGH (ref 7–18)
CHLORIDE: 106 mmol/L (ref 98–107)
CO2: 27 mmol/L (ref 21–32)
Calcium, Total: 7.8 mg/dL — ABNORMAL LOW (ref 8.5–10.1)
Creatinine: 1.73 mg/dL — ABNORMAL HIGH (ref 0.60–1.30)
EGFR (African American): 38 — ABNORMAL LOW
EGFR (Non-African Amer.): 33 — ABNORMAL LOW
Glucose: 164 mg/dL — ABNORMAL HIGH (ref 65–99)
Osmolality: 286 (ref 275–301)
POTASSIUM: 4.6 mmol/L (ref 3.5–5.1)
Sodium: 139 mmol/L (ref 136–145)
TOTAL PROTEIN: 6.5 g/dL (ref 6.4–8.2)

## 2014-06-01 LAB — CBC WITH DIFFERENTIAL/PLATELET
Basophil #: 0 10*3/uL (ref 0.0–0.1)
Basophil %: 0.1 %
EOS ABS: 0 10*3/uL (ref 0.0–0.7)
EOS PCT: 0.1 %
HCT: 34.4 % — ABNORMAL LOW (ref 40.0–52.0)
HGB: 11 g/dL — AB (ref 13.0–18.0)
Lymphocyte #: 0.8 10*3/uL — ABNORMAL LOW (ref 1.0–3.6)
Lymphocyte %: 5.5 %
MCH: 28.6 pg (ref 26.0–34.0)
MCHC: 32 g/dL (ref 32.0–36.0)
MCV: 89 fL (ref 80–100)
Monocyte #: 0.6 x10 3/mm (ref 0.2–1.0)
Monocyte %: 4.4 %
NEUTROS ABS: 12.8 10*3/uL — AB (ref 1.4–6.5)
Neutrophil %: 89.9 %
Platelet: 163 10*3/uL (ref 150–440)
RBC: 3.85 10*6/uL — AB (ref 4.40–5.90)
RDW: 12 % (ref 11.5–14.5)
WBC: 14.2 10*3/uL — ABNORMAL HIGH (ref 3.8–10.6)

## 2014-06-02 LAB — URINE CULTURE

## 2014-06-11 ENCOUNTER — Telehealth: Payer: Self-pay | Admitting: *Deleted

## 2014-06-11 ENCOUNTER — Telehealth: Payer: Self-pay

## 2014-06-11 NOTE — Telephone Encounter (Signed)
Form signed.

## 2014-06-11 NOTE — Telephone Encounter (Signed)
Form faxed back and scanned 

## 2014-06-11 NOTE — Telephone Encounter (Signed)
Melissa with [facility] left v/m; Melissa faxed over a letter on 06/10/14 notifying Dr Alphonsus SiasLetvak pt completed overnight oxysymetry test on 06/06/14. Pt does qualify for nocturnal oxygen based on that testing. If Dr Alphonsus SiasLetvak wants to pursue oxygen therapy [facility] would need current office visit note within 30 days of o&o done on 06/06/14; office note need to include symptoms which require oxygen therapy,  Chronic respiratory diagnosis and written order for oxygen therapy. Melissa received another order for overnight oxysymetry. Melissa request cb [phone].

## 2014-06-11 NOTE — Telephone Encounter (Signed)
Please see form on your desk to be filled out for diabetes supplies

## 2014-06-12 ENCOUNTER — Encounter: Payer: Self-pay | Admitting: Internal Medicine

## 2014-06-12 ENCOUNTER — Ambulatory Visit (INDEPENDENT_AMBULATORY_CARE_PROVIDER_SITE_OTHER): Payer: Medicare Other | Admitting: Internal Medicine

## 2014-06-12 VITALS — BP 132/60 | HR 72 | Temp 98.4°F | Wt 167.0 lb

## 2014-06-12 DIAGNOSIS — K5732 Diverticulitis of large intestine without perforation or abscess without bleeding: Secondary | ICD-10-CM

## 2014-06-12 DIAGNOSIS — N400 Enlarged prostate without lower urinary tract symptoms: Secondary | ICD-10-CM

## 2014-06-12 DIAGNOSIS — E119 Type 2 diabetes mellitus without complications: Secondary | ICD-10-CM

## 2014-06-12 DIAGNOSIS — G4734 Idiopathic sleep related nonobstructive alveolar hypoventilation: Secondary | ICD-10-CM

## 2014-06-12 DIAGNOSIS — N12 Tubulo-interstitial nephritis, not specified as acute or chronic: Secondary | ICD-10-CM | POA: Insufficient documentation

## 2014-06-12 DIAGNOSIS — I10 Essential (primary) hypertension: Secondary | ICD-10-CM

## 2014-06-12 DIAGNOSIS — N390 Urinary tract infection, site not specified: Secondary | ICD-10-CM

## 2014-06-12 NOTE — Telephone Encounter (Signed)
He is coming in today to review the study and document need for the oxygen. After the visit you can fax the order and note

## 2014-06-12 NOTE — Patient Instructions (Signed)
Please call if you have SOB at night or increased symptoms during the day (easier shortness of breath, etc)

## 2014-06-12 NOTE — Progress Notes (Signed)
Subjective:    Patient ID: Evan [name], male    DOB: [birth year], 80 y.o.   MRN: [id number]  HPI Here with his niece and her son from out of town  Seems to be over the infection Admitted 2 weeks ago with apparent pyelonephritis No stone noted Finishing the antibiotic now---2 weeks Bladder seems to empty well  Has had episodic sleep problems Notes cold in lower extremities--then some sweating Does note some dyspnea--- hard to do any activity Sometimes SOB at night also but no PND Does have a smoking history in the past No regular cough but does have the DOE  Overnight oximetry reveals significant desaturations Total under 88% of about 6 minutes He is unsure about the oxygen  Current Outpatient Prescriptions on File Prior to Visit  Medication Sig Dispense Refill  . aspirin 81 MG tablet Take 81 mg by mouth daily.        . BD INSULIN SYRINGE ULTRAFINE 31G X 5/16" 0.3 ML MISC Inject 1 Syringe into the muscle 3 (three) times daily.       . bimatoprost (LUMIGAN) 0.03 % ophthalmic solution Place 1 drop into both eyes at bedtime.      . brimonidine-timolol (COMBIGAN) 0.2-0.5 % ophthalmic solution Place 1 drop into both eyes 2 (two) times daily.        . fluticasone (FLONASE) 50 MCG/ACT nasal spray Place 2 sprays into both nostrils daily. In each nostril  16 g  12  . glipiZIDE (GLUCOTROL XL) 2.5 MG 24 hr tablet Take 1 tablet (2.5 mg total) by mouth daily.  90 tablet  3  . glucose blood (PRODIGY TEST) test strip Use as instructed to check blood sugar three times daily  100 each  6  . hydrALAZINE (APRESOLINE) 50 MG tablet TAKE 0.5 TABLET FOUR TIMES A DAY      . insulin lispro (HUMALOG) 100 UNIT/ML injection Inject 2 Units into the skin 3 (three) times daily before meals. Sliding scale  10 mL  1  . lisinopril (PRINIVIL,ZESTRIL) 5 MG tablet TAKE 1 TABLET DAILY  90 tablet  0  . nitroGLYCERIN (NITROSTAT) 0.4 MG SL tablet Place 0.4 mg under the tongue every 5 (five) minutes as needed.          . Polyethylene Glycol 3350 (MIRALAX PO) Take by mouth.      . pravastatin (PRAVACHOL) 40 MG tablet Take 1 tablet (40 mg total) by mouth daily.  90 tablet  3  . predniSONE (DELTASONE) 10 MG tablet Take 1 tablet (10 mg total) by mouth every other day.  100 tablet  0  . Psyllium (METAMUCIL) WAFR Take 2 Wafers by mouth 2 (two) times daily.      . tamsulosin (FLOMAX) 0.4 MG CAPS capsule TAKE 1 CAPSULE DAILY  90 capsule  0  . traMADol (ULTRAM) 50 MG tablet Take 1 tablet (50 mg total) by mouth 3 (three) times daily as needed.  120 tablet  0  . verapamil (CALAN) 40 MG tablet TAKE 1 TABLET TWICE A DAY  180 tablet  1  . Vitamin D, Ergocalciferol, (DRISDOL) 50000 UNITS CAPS capsule Take 1 capsule (50,000 Units total) by mouth every 30 (thirty) days.  12 capsule  0   No current facility-administered medications on file prior to visit.    Allergies  Allergen Reactions  . Penicillins     REACTION: unspecified    Past Medical History  Diagnosis Date  . CAD (coronary artery disease)   . Diabetes mellitus   .  GERD (gastroesophageal reflux disease)   . Hyperlipidemia   . Arthritis   . Hypertension   . BPH (benign prostatic hypertrophy)   . Degenerative disc disease   . Polymyalgia rheumatica   . CVA (cerebral vascular accident)   . Gastroenteritis   . Chronic kidney disease, stage III (moderate)     Past Surgical History  Procedure Laterality Date  . Cataract extraction  1999 1992    glaucoma  . Angioplasty    . Esophageal dilation    . US echocardiography  04/2004    benign    No family history on file.   Review of Systems Bowels have been fine Appetite is good    Objective:   Physical Exam  Constitutional: He appears well-developed and well-nourished. No distress.  Cardiovascular: Normal rate, regular rhythm and normal heart sounds.  Exam reveals no gallop.   No murmur heard. Pulmonary/Chest: Effort normal. No respiratory distress. He has no wheezes. He has no rales.   Slightly decreased breath sounds but clear  Abdominal: Soft. He exhibits no distension. There is no tenderness. There is no rebound and no guarding.  Musculoskeletal: He exhibits no edema.  No CVA tenderness  Psychiatric: He has a normal mood and affect. His behavior is normal.          Assessment & Plan:

## 2014-06-12 NOTE — Assessment & Plan Note (Addendum)
Doesn't seem to have urinary retention Hospital records reviewed May have been related to obstipation---he is now keeping himself clear Has just finished 2 weeks of levaquin

## 2014-06-12 NOTE — Assessment & Plan Note (Signed)
Seems to be voiding okay on tamsulosin

## 2014-06-12 NOTE — Assessment & Plan Note (Signed)
May have some degree of pulmonary hypertension/COPD (RVSP 41) Only 6 minutes and no clear symptoms at this point Might be appropriate to get oxygen for him but equivocal at this point Will hold off--if any increased symptoms suggestive from the hypoxemia, will order the oxygen

## 2014-06-12 NOTE — Assessment & Plan Note (Signed)
BP Readings from Last 3 Encounters:  06/12/14 132/60  04/29/14 140/60  04/21/14 166/66   Initial reading high but has come down since then No med changes

## 2014-06-14 ENCOUNTER — Other Ambulatory Visit: Payer: Self-pay | Admitting: Internal Medicine

## 2014-06-19 ENCOUNTER — Other Ambulatory Visit: Payer: Self-pay | Admitting: *Deleted

## 2014-06-19 MED ORDER — PREDNISONE 10 MG PO TABS: 10.0000 mg | ORAL_TABLET | ORAL | Status: DC

## 2014-06-19 MED ORDER — PRAVASTATIN SODIUM 40 MG PO TABS: 40.0000 mg | ORAL_TABLET | Freq: Every day | ORAL | Status: DC

## 2014-07-03 ENCOUNTER — Other Ambulatory Visit: Payer: Self-pay | Admitting: Internal Medicine

## 2014-07-25 ENCOUNTER — Other Ambulatory Visit: Payer: Medicare Other

## 2014-07-25 ENCOUNTER — Other Ambulatory Visit: Payer: Self-pay | Admitting: Internal Medicine

## 2014-07-31 ENCOUNTER — Telehealth: Payer: Self-pay

## 2014-07-31 MED ORDER — TRAMADOL HCL 50 MG PO TABS: 50.0000 mg | ORAL_TABLET | Freq: Three times a day (TID) | ORAL | Status: DC | PRN

## 2014-07-31 NOTE — Telephone Encounter (Signed)
rx called into pharmacy

## 2014-07-31 NOTE — Telephone Encounter (Signed)
Received paper fax from [facility] requesting authorization to dispense Tramadol HCL . Last refilled date 04/21/14. Patient's last office visit 06/12/14. Please advise.

## 2014-07-31 NOTE — Telephone Encounter (Signed)
Okay #120 x 0 

## 2014-07-31 NOTE — Addendum Note (Signed)
Addended by: Sueanne Margarita on: 07/31/2014 05:35 PM   Modules accepted: Orders

## 2014-08-23 ENCOUNTER — Other Ambulatory Visit: Payer: Self-pay | Admitting: Internal Medicine

## 2014-08-29 ENCOUNTER — Other Ambulatory Visit: Payer: Medicare Other

## 2014-08-30 ENCOUNTER — Other Ambulatory Visit: Payer: Self-pay | Admitting: Internal Medicine

## 2014-09-08 ENCOUNTER — Other Ambulatory Visit: Payer: Self-pay | Admitting: Internal Medicine

## 2014-09-30 ENCOUNTER — Emergency Department: Payer: Self-pay | Admitting: Emergency Medicine

## 2014-09-30 LAB — CBC
HCT: 37.5 % — ABNORMAL LOW (ref 40.0–52.0)
HGB: 12 g/dL — AB (ref 13.0–18.0)
MCH: 28.6 pg (ref 26.0–34.0)
MCHC: 31.8 g/dL — ABNORMAL LOW (ref 32.0–36.0)
MCV: 90 fL (ref 80–100)
PLATELETS: 152 10*3/uL (ref 150–440)
RBC: 4.18 10*6/uL — AB (ref 4.40–5.90)
RDW: 11.9 % (ref 11.5–14.5)
WBC: 8.8 10*3/uL (ref 3.8–10.6)

## 2014-09-30 LAB — BASIC METABOLIC PANEL
ANION GAP: 6 — AB (ref 7–16)
BUN: 40 mg/dL — AB (ref 7–18)
CHLORIDE: 107 mmol/L (ref 98–107)
Calcium, Total: 8.6 mg/dL (ref 8.5–10.1)
Co2: 28 mmol/L (ref 21–32)
Creatinine: 2.09 mg/dL — ABNORMAL HIGH (ref 0.60–1.30)
EGFR (Non-African Amer.): 32 — ABNORMAL LOW
GFR CALC AF AMER: 38 — AB
Glucose: 200 mg/dL — ABNORMAL HIGH (ref 65–99)
OSMOLALITY: 297 (ref 275–301)
POTASSIUM: 4.2 mmol/L (ref 3.5–5.1)
Sodium: 141 mmol/L (ref 136–145)

## 2014-09-30 LAB — PROTIME-INR
INR: 1
Prothrombin Time: 13.3 secs (ref 11.5–14.7)

## 2014-10-10 ENCOUNTER — Encounter: Payer: Medicare Other | Admitting: Internal Medicine

## 2014-10-13 ENCOUNTER — Encounter: Payer: Medicare Other | Admitting: Internal Medicine

## 2014-10-15 ENCOUNTER — Ambulatory Visit (INDEPENDENT_AMBULATORY_CARE_PROVIDER_SITE_OTHER): Payer: Medicare Other | Admitting: Internal Medicine

## 2014-10-15 ENCOUNTER — Encounter: Payer: Self-pay | Admitting: Internal Medicine

## 2014-10-15 VITALS — BP 160/70 | HR 64 | Temp 98.1°F | Ht 75.0 in | Wt 173.0 lb

## 2014-10-15 DIAGNOSIS — I25118 Atherosclerotic heart disease of native coronary artery with other forms of angina pectoris: Secondary | ICD-10-CM | POA: Diagnosis not present

## 2014-10-15 DIAGNOSIS — M353 Polymyalgia rheumatica: Secondary | ICD-10-CM

## 2014-10-15 DIAGNOSIS — E1140 Type 2 diabetes mellitus with diabetic neuropathy, unspecified: Secondary | ICD-10-CM

## 2014-10-15 DIAGNOSIS — N058 Unspecified nephritic syndrome with other morphologic changes: Secondary | ICD-10-CM

## 2014-10-15 DIAGNOSIS — E1129 Type 2 diabetes mellitus with other diabetic kidney complication: Secondary | ICD-10-CM

## 2014-10-15 DIAGNOSIS — Z23 Encounter for immunization: Secondary | ICD-10-CM | POA: Diagnosis not present

## 2014-10-15 DIAGNOSIS — E1142 Type 2 diabetes mellitus with diabetic polyneuropathy: Secondary | ICD-10-CM | POA: Diagnosis not present

## 2014-10-15 DIAGNOSIS — N183 Chronic kidney disease, stage 3 unspecified: Secondary | ICD-10-CM

## 2014-10-15 DIAGNOSIS — Z0000 Encounter for general adult medical examination without abnormal findings: Secondary | ICD-10-CM

## 2014-10-15 DIAGNOSIS — M25462 Effusion, left knee: Secondary | ICD-10-CM

## 2014-10-15 DIAGNOSIS — I10 Essential (primary) hypertension: Secondary | ICD-10-CM

## 2014-10-15 LAB — RENAL FUNCTION PANEL
Albumin: 3.5 g/dL (ref 3.5–5.2)
BUN: 26 mg/dL — ABNORMAL HIGH (ref 6–23)
CALCIUM: 8.6 mg/dL (ref 8.4–10.5)
CO2: 28 mEq/L (ref 19–32)
CREATININE: 1.7 mg/dL — AB (ref 0.4–1.5)
Chloride: 102 mEq/L (ref 96–112)
GFR: 47.18 mL/min — ABNORMAL LOW (ref >60.00)
Glucose, Bld: 189 mg/dL — ABNORMAL HIGH (ref 70–99)
Phosphorus: 3.3 mg/dL (ref 2.3–4.6)
Potassium: 4.2 mEq/L (ref 3.5–5.1)
SODIUM: 135 meq/L (ref 135–145)

## 2014-10-15 LAB — SEDIMENTATION RATE: Sed Rate: 57 mm/hr — ABNORMAL HIGH (ref 0–22)

## 2014-10-15 LAB — HM DIABETES FOOT EXAM

## 2014-10-15 LAB — HEMOGLOBIN A1C: Hgb A1c MFr Bld: 7.7 % — ABNORMAL HIGH (ref 4.6–6.5)

## 2014-10-15 NOTE — Assessment & Plan Note (Signed)
Due for labs

## 2014-10-15 NOTE — Assessment & Plan Note (Signed)
I have personally reviewed the Medicare Annual Wellness questionnaire and have noted 1. The patient's medical and social history 2. Their use of alcohol, tobacco or illicit drugs 3. Their current medications and supplements 4. The patient's functional ability including ADL's, fall risks, home safety risks and hearing or visual             impairment. 5. Diet and physical activities 6. Evidence for depression or mood disorders  The patients weight, height, BMI and visual acuity have been recorded in the chart I have made referrals, counseling and provided education to the patient based review of the above and I have provided the pt with a written personalized care plan for preventive services.  I have provided you with a copy of your personalized plan for preventive services. Please take the time to review along with your updated medication list.  Flu and prevnar today No cancer screening due to age 

## 2014-10-15 NOTE — Assessment & Plan Note (Signed)
PROCEDURE Sterile prep to medial left knee Ethyl chloride then 2cc plain 2% lido local 43cc of clear synovial fluid removed 40mg  depomedorl and 5cc 2% lido instilled  Tolerated well Discussed home care

## 2014-10-15 NOTE — Assessment & Plan Note (Signed)
Seems to be controlled with the every other day prednisone Will recheck sed rate

## 2014-10-15 NOTE — Assessment & Plan Note (Signed)
BP Readings from Last 3 Encounters:  10/15/14 160/70  06/12/14 132/60  04/29/14 140/60   Up some today due to knee pain Will not change any meds today

## 2014-10-15 NOTE — Assessment & Plan Note (Signed)
Mostly tingling No meds for this

## 2014-10-15 NOTE — Assessment & Plan Note (Signed)
Seems to still have acceptable control Due for A1c

## 2014-10-15 NOTE — Assessment & Plan Note (Signed)
Has stable angina pattern Discussed using the nitro prn

## 2014-10-15 NOTE — Addendum Note (Signed)
Addended by: Sueanne MargaritaSMITH, Madhuri Vacca L on: 10/15/2014 03:19 PM   Modules accepted: Orders

## 2014-10-15 NOTE — Assessment & Plan Note (Signed)
Is on ACEI Will recheck renal today

## 2014-10-15 NOTE — Progress Notes (Signed)
Subjective:    Patient ID: Evan [name], male    DOB: 09/09/1918, 80 y.o.   MRN: [id number]  HPI Here with niece For Medicare wellness exam and follow up of DM and other chronic medical problems Reviewed his advanced directives Reviewed form also Has had 1 fall this year---did get injured No tobacco or alcohol No set exercise but had PT after last hospitalization ([facility]) Still independent with instrumental ADLs--but he gets help now with cleaning, etc Doesn't drive much now. Niece takes him to grocery store Mild cognitive problems occ tinnitus and hearing not great Vision is not good either  Had ER visit last month  Had left leg pain Worried about DVT but testing negative Just diagnosed with musculoskeletal pain Still with swelling and burning pain-- mostly in left knee Has pain and burning in feet---but this is higher up   Checks sugars daily Has been fine 140's usually--rarely higher No hypoglycemic reactions No foot ulcers  Does have easy DOE---may be anginal equivalent Occasional chest pain-- hasn't used the nitro lately (Dr Juliann Paresallwood told him not too) Still wobbly and some dizziness upon arising. No syncope Some ankle swelling chronically--no recent change  Voids okay Nocturia frequently ---uses urinal. Stable  No problems with statin No apparent myalgias No GI problems in general (had 4BM yesterday--thinks it was something he ate)  Mild shoulder aching Mostly better since still on the prednisone Hips not really aching--but trouble with knee as noted  Current Outpatient Prescriptions on File Prior to Visit  Medication Sig Dispense Refill  . aspirin 81 MG tablet Take 81 mg by mouth daily.      . BD INSULIN SYRINGE ULTRAFINE 31G X 5/16" 0.3 ML MISC Inject 1 Syringe into the muscle 3 (three) times daily.     . brimonidine-timolol (COMBIGAN) 0.2-0.5 % ophthalmic solution Place 1 drop into both eyes 2 (two) times daily.      Marland Kitchen. GLIPIZIDE XL 2.5 MG  24 hr tablet TAKE 1 TABLET DAILY 90 tablet 2  . glucose blood (PRODIGY TEST) test strip Use as instructed to check blood sugar three times daily 100 each 6  . hydrALAZINE (APRESOLINE) 50 MG tablet TAKE 1 TABLET FOUR TIMES A DAY 360 tablet 1  . insulin lispro (HUMALOG) 100 UNIT/ML injection Inject 2 Units into the skin 3 (three) times daily before meals. Sliding scale 10 mL 1  . lisinopril (PRINIVIL,ZESTRIL) 5 MG tablet TAKE 1 TABLET DAILY 90 tablet 3  . nitroGLYCERIN (NITROSTAT) 0.4 MG SL tablet Place 0.4 mg under the tongue every 5 (five) minutes as needed.      . Polyethylene Glycol 3350 (MIRALAX PO) Take by mouth.    . pravastatin (PRAVACHOL) 40 MG tablet Take 1 tablet (40 mg total) by mouth daily. 90 tablet 3  . predniSONE (DELTASONE) 10 MG tablet Take 1 tablet (10 mg total) by mouth every other day. 100 tablet 0  . Psyllium (METAMUCIL) WAFR Take 2 Wafers by mouth 2 (two) times daily.    . tamsulosin (FLOMAX) 0.4 MG CAPS capsule TAKE 1 CAPSULE DAILY 90 capsule 3  . traMADol (ULTRAM) 50 MG tablet Take 1 tablet (50 mg total) by mouth 3 (three) times daily as needed. 120 tablet 0  . verapamil (CALAN) 40 MG tablet TAKE 1 TABLET TWICE A DAY 180 tablet 3  . Vitamin D, Ergocalciferol, (DRISDOL) 50000 UNITS CAPS capsule Take 1 capsule (50,000 Units total) by mouth every 30 (thirty) days. 12 capsule 0   No current  facility-administered medications on file prior to visit.    Allergies  Allergen Reactions  . Penicillins     REACTION: unspecified    Past Medical History  Diagnosis Date  . CAD (coronary artery disease)   . Diabetes mellitus   . GERD (gastroesophageal reflux disease)   . Hyperlipidemia   . Arthritis   . Hypertension   . BPH (benign prostatic hypertrophy)   . Degenerative disc disease   . Polymyalgia rheumatica   . CVA (cerebral vascular accident)   . Gastroenteritis   . Chronic kidney disease, stage III (moderate)     Past Surgical History  Procedure Laterality Date    . Cataract extraction  1999 1992    glaucoma  . Angioplasty    . Esophageal dilation    . [place] echocardiography  04/2004    benign    No family history on file.  History   Social History  . Marital Status: Married    Spouse Name: N/A    Number of Children: N/A  . Years of Education: N/A   Occupational History  . retired    Social History Main Topics  . Smoking status: Former Games developermoker  . Smokeless tobacco: Never Used     Comment: quit over 20 years ago  . Alcohol Use: No  . Drug Use: No  . Sexual Activity: Not on file   Other Topics Concern  . Not on file   Social History Narrative   Religion affecting care--involved with Church throughout life (deacon, etc)      Has living will    Requests son Karren BurlyDwight  as his health care power of attorney   Requests DNR--- done 10/15/14   No tube feeds if cognitively unaware            Review of Systems Usually mild constipation Sleeps okay in general--has been using the pain meds (got in ER) Appetite is okay Lost a few pounds--he wasn't aware of it    Objective:   Physical Exam  Constitutional: He is oriented to person, place, and time. He appears well-developed and well-nourished. No distress.  HENT:  Mouth/Throat: Oropharynx is clear and moist. No oropharyngeal exudate.  Neck: Normal range of motion. Neck supple. No thyromegaly present.  Cardiovascular: Normal rate, regular rhythm, normal heart sounds and intact distal pulses.  Exam reveals no gallop.   No murmur heard. Pulmonary/Chest: Effort normal. No respiratory distress. He has no wheezes. He has no rales.  Decreased breath sounds at bases  Abdominal: Soft. There is no tenderness.  Musculoskeletal:  Swelling with marked pain with passive ROM of left knee  Lymphadenopathy:    He has no cervical adenopathy.  Neurological: He is alert and oriented to person, place, and time.  President--- [address]Barack Obama, [place]" [id number] Recall 2/3  Skin: No  rash noted.  No foot lesions  Psychiatric: He has a normal mood and affect. His behavior is normal.          Assessment & Plan:

## 2014-10-17 ENCOUNTER — Telehealth: Payer: Self-pay | Admitting: Internal Medicine

## 2014-10-17 NOTE — Telephone Encounter (Signed)
Pt returned you call about lab results.

## 2014-10-20 NOTE — Telephone Encounter (Signed)
See result note.  

## 2014-11-13 ENCOUNTER — Other Ambulatory Visit: Payer: Self-pay | Admitting: *Deleted

## 2014-11-13 MED ORDER — ONETOUCH ULTRA 2 W/DEVICE KIT: PACK | Status: DC

## 2014-11-13 MED ORDER — GLUCOSE BLOOD VI STRP: ORAL_STRIP | Status: DC

## 2014-11-13 MED ORDER — ONETOUCH ULTRASOFT LANCETS MISC: Status: DC

## 2014-11-13 NOTE — Telephone Encounter (Signed)
Per fax received 11/07/14 pt can get a covered glucose meter from [employer] per his insurance. rx sent to pharmacy by e-script Pt is aware

## 2014-12-04 ENCOUNTER — Other Ambulatory Visit: Payer: Self-pay

## 2014-12-04 NOTE — Telephone Encounter (Signed)
Approved: okay to change to daily and #100 x 3

## 2014-12-04 NOTE — Telephone Encounter (Signed)
[name]; pt requesting refill prednisone 10 mg one daily; med list has every other day but 10/15/14 result note had prednisone 10 mg increased to daily. Changed instruction on refill request order to one daily. Please advise.pt has 6 month f/u scheduled with Dr Alphonsus SiasLetvak 04/16/2015.

## 2014-12-05 MED ORDER — PREDNISONE 10 MG PO TABS: 10.0000 mg | ORAL_TABLET | Freq: Every day | ORAL | Status: DC

## 2014-12-09 ENCOUNTER — Other Ambulatory Visit: Payer: Self-pay | Admitting: Internal Medicine

## 2015-02-24 NOTE — Discharge Summary (Signed)
PATIENT NAME:  CASH, DUCE MR#:  [id number] DATE OF BIRTH:  1918/10/04  DATE OF ADMISSION:  07/26/2012 DATE OF DISCHARGE:  07/29/2012  ADMITTING DIAGNOSES: 1. Acute urinary tract infection.  2. Acute cystitis versus acute pyelonephritis.   DISCHARGE DIAGNOSES: 1. Systemic inflammatory response reaction. 2. Abdominal pain, suprapubic. Likely mild diverticulitis. 3. Dysuria.  4. Constipation.  5. Leukocytosis, resolved.  6. History of diabetes mellitus with diabetic neuropathy.  7. Hypertension.  8. Coronary artery disease.  9. Cerebrovascular accident with mild residual right-sided weakness.  10. Gastroesophageal reflux disease. 11. Benign prostatic hypertrophy.  12. Glaucoma.  13. Chronic renal failure with creatinine now 1.7.   DISCHARGE CONDITION:  Fair.  DISCHARGE MEDICATIONS:   1. Flomax 0.4 mg daily. 2. Plavix 75 mg p.o. daily.  3. Aspirin 81 mg p.o. daily.  4. Glucotrol XL 2.5 mg p.o. daily. 5. Xalatan ophthalmic solution 0.005% solution, one drop to both eyes once daily at bedtime.  6. Brimonidine timolol  0.2%/0.5% ophthalmic solution, one drop to both eyes twice daily.  7. Hydralazine 50 mg p.o. 4 times daily.  8. Nitroglycerin 0.4 mg sublingually every five minutes as needed.  9. MiraLAX oral powder for constitution 17 grams daily.  10. Prednisone 10 mg alternating with 5 mg daily.  11. Ultram 50 mg 3 times daily as needed.  12. Verapamil 40 mg p.o. twice daily.  13. Nitroglycerin 0.4 mg topical film once a day.  14. Flagyl 500 mg p.o. every eight hours for 7 days. 15. Gabapentin 100 mg p.o. at bedtime.  16. Ciprofloxacin 250 mg p.o. every 12 hours for seven days.   DIET:  2 grams salt, low fat, low cholesterol, carbohydrate controlled diet, mechanical soft.   ACTIVITY LIMITATIONS: As tolerated.   FOLLOWUP: Follow-up appointment with Dr. Alphonsus Sias two days after discharge.   CONSULTANTS: None.   RADIOLOGIC STUDIES:  1. CT scan of abdomen and pelvis  without contrast on 07/26/2012 revealed no acute findings within the abdomen or pelvis within the limitations of lack of oral or IV contrast. There is a small low-attenuation mass in the spleen. This may represent a cyst such as posttraumatic cyst. However, it is incompletely characterized, nonspecific, and this is new from prior. Clinical correlation is suggested. Follow-up imaging could be performed to ensure stability. Further evaluation could be provided with contrast-enhanced imaging if the patient's renal function improves. Diverticulosis of the sigmoid colon was also noted. Enlarged prostate was also noted.  Also, mild bilateral perinephric stranding which was nonspecific was noted. Multiple parapelvic cysts, left greater than the right, which were similar to prior noted. No obvious hydronephrosis. 2. KUB on 07/28/2012 showed findings which can be consistent with constipation but no obstruction evident noted.  HISTORY OF PRESENT ILLNESS:  The patient is a 81 year old African American male with past medical history significant for history of diabetes mellitus and hypertension who presented to the hospital with complaints of suprapubic pain, dysuria, difficulty with urination as well as chills for the past 24 hours. Please refer to Dr. Riley Nearing admission note on 07/26/2012. On arrival to the hospital, the patient's blood pressure was 163/79, temperature was 98.4, pulse 80, respiration rate 18, oxygen saturation was 99% on oxygen therapy. Physical exam without significant tenderness except in the suprapubic area abdominal exam. No other abnormalities on abdominal exam were noted.   LABORATORY DATA 07/26/2012: Mild elevation of BUN and creatinine to 27 and 1.7. Glucose 164, estimated GFR for African American would be 40.  Lipase level was normal  at 178, total protein was elevated at 8.5, total bilirubin was 1.3, otherwise liver enzymes were completely within normal limits. Cardiac enzymes time one within  normal limits. The patient's white blood cell count was markedly elevated at 23.2, hemoglobin 13.4, platelet count 187. Urinalysis revealed yellow hazy urine, negative for glucose or bilirubin. Trace ketones, specific gravity 1.014, pH 5, negative for blood, 100 mg/dL protein, negative for nitrites, 1+ leukocyte esterase, 1 red blood cell, 38 white blood cells. No bacteria were seen or epithelial cells. EKG showed normal sinus rhythm at 77 beats per minute. Normal axis. No acute ST-T changes were noted except for left ventricular hypertrophy with repolarization abnormality.  HOSPITAL COURSE:  The patient was  admitted to the hospital with diagnosis of possible urinary tract infection related to abdominal pain. Urine cultures were taken and the patient was noted to have enterococcus faecalis of 40,000 colony-forming units sensitive to all antibiotics except for tetracyclines. Initially the patient was managed on Rocephin. His condition somewhat improved but not completely. He was complaining of some abdominal discomfort in the suprapubic area and stated that he was constipated. After laxatives the patient had a good bowel movement and felt much more comfortable. Because the urine culture showed only 40,000 colony-forming units, not consistent with urinary tract infection, it was felt that the patient's abdominal pain could have been related to constipation as well as possible mild diverticulitis. It was felt that the patient should be continued on antibiotic therapy to treat possible diverticular disease as well. The patient is to continue antibiotics for seven more days to complete the course. The patient is to follow up with his primary care physician for further recommendations. With this therapy the patient's white blood cell count has improved. By the day of discharge the patient's white blood cell count improved from 23,000 to 13,900. It is recommended to follow the patient's white blood cell count as  outpatient to make sure that the patient's white blood cell count normalizes.    The patient was also noted to be somewhat anemic with hemoglobin level around 10 by the day of discharge. It is recommended to follow hemoglobin level to make sure that the patient's anemia does not progress. The patient was started on IV fluid hydration in the hospital.  He was noted to have elevated creatinine level on admission to 1.7 on 07/26/2012. With hydration the patient's kidney function somewhat improved to 1.58 by the day of discharge, 07/29/2012.  It is recommended to follow the patient's creatinine level and make sure that the patient is being followed by a nephrologist as an outpatient as well.  It is possible that the patient has chronic renal insufficiency.  As mentioned above, urine cultures grew only 40,000 colony-forming units of enterococcus, sensitive to levofloxacin including ciprofloxacin which the patient will be receiving upon discharge. The patient is to continue antibiotics for seven days to complete the course. It is recommended to follow the patient's urine output as well because of his benign prostatic hypertrophy and rule out urinary retention.   In regards to diabetes mellitus, the patient is to continue his Glucotrol. However, he was complaining of some lower extremity numbness as well as pain so Neurontin was initiated for him at night. It is recommended to advance Neurontin dose depending on the patient's needs.    For hypertension, coronary artery disease, history of cerebrovascular accident, gastroesophageal reflux disease, and glaucoma, the patient is to continue his outpatient medications.  The patient is being discharged in stable  condition with the above-mentioned medications and followup. His vital signs on the discharge revealed temperature 97.7, pulse 57, respirations 18, blood pressure 130/50, saturation 95% on room air.   TIME SPENT: 40 minutes.       ____________________________ Katharina Caper, MD rv:bjt D: 07/29/2012 18:14:29 ET T: 07/31/2012 11:26:08 ET JOB#: [id number]  cc: Katharina Caper, MD, <Dictator> Karie Schwalbe, MD Jeriah Corkum Winona Legato MD ELECTRONICALLY SIGNED 08/01/2012 18:27

## 2015-02-24 NOTE — H&P (Signed)
PATIENT NAME:  Evan [name] MR#:  [id number] DATE OF BIRTH:  09-05-1918  DATE OF ADMISSION:  07/26/2012  PRIMARY CARE PHYSICIAN: Dr. Tillman Abide.  REFERRING PHYSICIAN: Dr. Cyril Loosen.   CHIEF COMPLAINT: Suprapubic pain, dysuria, difficult urination, chills x24 hours.   HISTORY OF PRESENT ILLNESS: Evan [name] is a 81 year old pleasant African American male with a history of systemic hypertension and diabetes mellitus type 2. He was in his usual state of health until the last 24 hours when he started to have some dysuria associated with difficulty in urination and shaking chills. The patient was evaluated here in the Emergency Department and found to have a urinary tract infection. However, due to his age and the elevated white blood cell count reaching 23,000 and he was shaking while he was here and having dry heaves, he was admitted for IV antibiotics, IV hydration and to ensure he is not heading towards sepsis. The suprapubic pain was moderate in intensity, pressure-like feeling. No hematuria reported. No back pain.   REVIEW OF SYSTEMS: CONSTITUTIONAL: Unsure if he had fever but he reports shaking chills. Mild fatigue. EYES: No blurring of vision. No double vision. ENT: No hearing impairment. No sore throat. No dysphagia. CARDIOVASCULAR: No chest pain. No shortness of breath. No edema. No syncope. RESPIRATORY: No cough. No sputum production. No chest pain. GASTROINTESTINAL: No abdominal pain other than the suprapubic pain. He has nausea and dry heaves but no vomiting. No hematochezia. No melena. GENITOURINARY: Reports difficulty in urination associated with dysuria as well and frequency. MUSCULOSKELETAL: He has chronic shoulder pain. No joint swelling. No muscular pain or swelling. INTEGUMENTARY: No skin rash. No ulcers. NEUROLOGY: No focal weakness. No seizure activity. No headache. PSYCHIATRY: No anxiety. No depression.   PAST MEDICAL HISTORY:    1. Diabetes mellitus type 2.  2. Systemic  hypertension. 3. Coronary artery disease, status post stent implant in 1993.  4. History of stroke with residual mild right hemiparesis which had improved.  5. Gastroesophageal reflux disease. 6. Benign prostatic hypertrophy. 7. Glaucoma.   PAST SURGICAL HISTORY: Cataract and glaucoma surgery.   SOCIAL HABITS: Nonsmoker. No history of alcohol or drug abuse. He has a remote history of smoking. He quit about 15 years ago.   FAMILY HISTORY: Negative for cancer. Negative for hypertension and diabetes. He has no information about his parents. They died a long time ago. He has no siblings.   SOCIAL HISTORY: He is retired from working with [employer]. He is married, living with his wife.   ADMISSION MEDICATIONS:  1. Verapamil 40 mg twice a day. 2. Hydralazine 50 mg 4 times a day.  3. Aspirin 81 mg a day.  4. Plavix 75 mg a day. 5. Flomax 0.4 mg 1 capsule once a day.  6. Ultram 50 mg 3 times a day p.r.n. for pain. 7. Prednisone for his arthritis 10 mg every other day alternating with 5 mg every other day. 8. NovoLog insulin. This is used only p.r.n. if her blood sugar is elevated above 250.  9. Glipizide 5 mg a day. 10. Nitroglycerin 0.4 mg p.r.n. for chest pain.  11. MiraLAX 17 grams once a day. 12. Brimonidine Timolol 0.2 mg one drop in each eye twice a day. 13. Xalatan ophthalmic one drop both eyes once at night.   ALLERGIES: Penicillin causing a skin rash.   PHYSICAL EXAMINATION:  VITAL SIGNS: Blood pressure 163/79, respiratory rate 18, pulse 80, temperature 98.4, oxygen saturation 99%.   GENERAL APPEARANCE: Elderly male laying  in bed in no acute distress.   HEAD: No pallor. No icterus. No cyanosis.   ENT: Hearing was normal. Nasal mucosa, lips, tongue were normal.   EYES: Normal eyelids and conjunctiva. Pupils about 4 mm, sluggishly reactive to light.   NECK: Supple. Trachea at midline. No thyromegaly. No cervical lymphadenopathy.   HEART: Normal S1, S2. No S3, S4. No  murmur. No gallop. No carotid bruits.   LUNGS: Normal breathing pattern without use of accessory muscles. No rales. No wheezing.   ABDOMEN: Soft without tenderness except for the suprapubic area. He has mild to moderate tenderness upon deep palpation. No rebound. No rigidity. No hepatosplenomegaly. No masses. No hernias.   SKIN: No ulcers. No subcutaneous nodules.   MUSCULOSKELETAL: No joint swelling. No clubbing.   NEUROLOGIC: Cranial nerves II through XII are intact. No focal motor deficit.   PSYCHIATRIC: The patient is alert and oriented x3. Mood and affect were normal.   LABORATORY, DIAGNOSTIC, AND RADIOLOGICAL DATA: CT scan of the abdomen showed no acute findings. Evidence of diverticulosis of the sigmoid colon. Serum glucose 154, BUN 27, creatinine 1.7, sodium 140, potassium 3.9. His liver function tests were normal. Bilirubin was 1.3. Troponin less than 0.02. CBC showed a white count of 23,000, hemoglobin 13, hematocrit 40, platelet count 187. Urinalysis showed 38 white blood cells, +1 leukocyte esterase.   ASSESSMENT:  1. Acute urinary tract infection. Acute cystitis versus early acute pyelonephritis.  2. Shaking chills secondary to his urinary tract infection. 3. Dysuria and difficulty IN urination consistent with his cystitis and underlying benign prostatic hypertrophy.  4. Systemic hypertension.  5. Diabetes mellitus, type II. 6. Element of renal failure, unsure whether this is acute or chronic.   OTHER MEDICAL PROBLEMS:  1. History of coronary artery disease. 2. History of stroke.  3. History of glaucoma and cataract.   PLAN:  1. Will admit the patient to the medical floor.  2. IV hydration. 3. Urine was sent for culture.  4. IV Rocephin.  5. Place the patient on Accu-Cheks and sliding scale.  6. Continue home medications as listed above.  7. The patient indicates that he has a LIVING WILL. He did not bring a copy here. He is FULL CODE.   TIME SPENT IN EVALUATING  THIS PATIENT AND REVIEWING HIS MEDICAL RECORDS: Took more than 55 minutes.   ____________________________ Carney CornersAmir M. Rudene Rearwish, MD amd:ap D: 07/26/2012 23:53:57 ET T: 07/27/2012 07:59:04 ET JOB#: [id number]  cc: Carney CornersAmir M. Rudene Rearwish, MD, <Dictator> Karie Schwalbeichard I. Letvak, MD  Karolee OhsAMIR Dala DockM Annai Heick MD ELECTRONICALLY SIGNED 07/27/2012 22:17

## 2015-02-28 NOTE — H&P (Signed)
PATIENT NAME:  Evan [name], Evan [name] MR#:  [id number] DATE OF BIRTH:  1918-02-14  DATE OF ADMISSION:  05/31/2014  REFERRING PHYSICIAN: Sheryl L. Mindi JunkerGottlieb, MD.  FAMILY PHYSICIAN:  Karie Schwalbeichard I. Letvak, MD.  REASON FOR ADMISSION: Abdominal pain.   HISTORY OF PRESENT ILLNESS: The patient is a 81 year old male who lives alone. The patient has a history of coronary artery disease, chronic kidney disease, polymyalgia rheumatica, and previous stroke. Presents to the Emergency Room with abdominal pain and constipation. In the Emergency Room, the patient was noted to have a white count of over 17,000. Urinalysis was abnormal. CT of the abdomen suggested possible pyelonephritis. He is now admitted for further evaluation. He has been having constipation and urinary retention. Occasional shortness of breath. Some nausea, but no vomiting. Denies fever.   PAST MEDICAL HISTORY: 1.  ASCVD.  2.  Benign hypertension.  3.  Polymyalgia rheumatica.  4.  Chronic kidney disease.  5.  Type 2 diabetes.  6.  Previous stroke.  7.  Osteoarthritis.  8.  GE reflux disease.  9.  Status post percutaneous transluminal coronary angioplasty with stent placement.   MEDICATIONS: 1.  Xalatan eyedrops as directed.  2.  Verapamil 40 mg p.o. b.i.d.  3.  Tramadol 50 mg p.o. t.i.d. as needed.  4.  Prednisone 10 mg every other day. 5.  Pravastatin 40 mg p.o. daily.  6.  Nitrostat 0.4 mg sublingually p.r.n. chest pain.  7.  Metamucil wafers b.i.d.  8.  Lisinopril 5 mg p.o. daily.  9.  Lispro  per sliding scale.  10.  Hydralazine 25 mg p.o. q.i.d.  11.  Glipizide 2.5 mg p.o. daily.  12.  Flonase 2 puffs in each nostril daily.  13.  Flomax 0.4 mg p.o. daily.  14.  Aspirin 81 mg p.o. daily.   ALLERGIES: PENICILLIN.   SOCIAL HISTORY: Negative for alcohol or tobacco abuse.   FAMILY HISTORY: Positive for hypertension, diabetes,, coronary artery disease.   REVIEW OF SYSTEMS:  CONSTITUTIONAL: No fever or change in weight.  EYES:  No blurred or double vision.  ENT: No tinnitus or hearing loss. No nasal discharge or bleeding. No difficulty swallowing.  RESPIRATORY: The patient does have occasional cough. No wheezing or hemoptysis. No painful respiration.  CARDIOVASCULAR: No chest pain or orthopnea. No syncope.  GASTROINTESTINAL: No vomiting or diarrhea; otherwise, as per HPI.  GENITOURINARY: No dysuria or hematuria. No incontinence.  ENDOCRINE: No polyuria or polydipsia. No heat or cold intolerance.  HEMATOLOGIC: The patient denies anemia, easy bruising, or bleeding.  LYMPHATIC: No swollen glands.  MUSCULOSKELETAL: The patient has pain in his back, but denies neck, shoulder, knee, or hip pain. No gout.  NEUROLOGIC: No numbness or migraines. Denies seizures.  PSYCHIATRIC: The patient denies anxiety, insomnia or depression.   PHYSICAL EXAMINATION: GENERAL: The patient is chronically ill-appearing, but in no acute distress.  VITAL SIGNS: Currently remarkable for a blood pressure of 172/95, with a heart rate of 61, respiratory rate of 12, temperature of 98.4, saturations 100% on room air.  HEENT: Normocephalic, atraumatic. Pupils equally round and reactive to light and accommodation. Extraocular movements are intact. Sclerae are anicteric. Conjunctivae are clear.  Oropharynx is clear.   NECK: Supple without JVD. No adenopathy or thyromegaly is noted.  LUNGS: Clear to auscultation and percussion without wheezes, rales or rhonchi. No dullness. Respiratory effort is normal.  CARDIAC: Regular rate and rhythm. Normal S1, S2. No significant rubs or gallops. PMI is nondisplaced. Chest wall is nontender.  ABDOMEN: Soft but  diffusely tender. No rebound or guarding. Normoactive bowel sounds. No organomegaly or masses were appreciated. No hernias or bruits were noted.  EXTREMITIES: Reveal trace edema. Pulses were 2+ bilaterally.  SKIN: Warm and dry without rash or lesions.  NEUROLOGIC: Cranial nerves II through XII grossly intact.  Deep tendon reflexes were symmetric. Motor and sensory exam is nonfocal.  PSYCHIATRIC: Revealed a patient who is alert and oriented to person, place, and time. He was cooperative and used good judgment.   LABORATORY DATA: CT of the abdomen and pelvis revealed no acute abnormality, with diverticulosis noted, but no diverticulitis. Urine showed 759 WBCs per high-power field with 3+ leukocyte esterase and mild hematuria. His white count was 17.6 with a hemoglobin of 11.5. Glucose 168 with a BUN of 29, creatinine 1.80.   ASSESSMENT: 1.  Abdominal pain.  2.  Urinary tract infection/pyelonephritis.  3.  Stage III chronic kidney disease.  4.  Type 2 diabetes.  5.  Anemia of chronic disease.  6.  Chronic constipation.   PLAN: The patient will be admitted to the floor and started on IV fluids with IV antibiotics. Urine culture has been sent. We will optimize his bowel regimen. Continue his antihypertensives. Continue steroids for PMR. Follow up routine labs in the morning. Clear liquid diet for now. Further treatment and evaluation will depend upon the patient's progress.   TOTAL TIME SPENT ON THIS PATIENT: 50 minutes.    ____________________________ Duane Lope Judithann Sheen, MD jds:ds D: 05/31/2014 18:35:11 ET T: 05/31/2014 18:48:40 ET JOB#: [id number]  cc: Duane Lope. Judithann Sheen, MD, <Dictator> Karie Schwalbe, MD Daune Colgate Rodena Medin MD ELECTRONICALLY SIGNED 06/01/2014 11:00

## 2015-02-28 NOTE — Discharge Summary (Signed)
PATIENT NAME:  Evan [name], Zorawar C MR#:  [id number] DATE OF BIRTH:  01/06/1918  DATE OF ADMISSION:  05/31/2014 DATE OF DISCHARGE: 06/01/2014   ADMISSION DIAGNOSES:  1.  Abdominal pain.  2.  Acute pyelonephritis.   DISCHARGE DIAGNOSES:  1.  Abdominal pain.   2.  Acute pyelonephritis.  3.  History of benign prostatic hypertrophy.  4.  Polymyalgia rheumatic,  5.  Diabetes.  6.  Coronary artery disease.  7.  Likely chronic kidney disease, stage III.   HISTORY OF PRESENT ILLNESS: This is likely chronic kidney disease, stage III.   CONSULTATIONS: None.   LABORATORY DATA:  White blood cells 14, hemoglobin 11, hematocrit 35, platelets 163,000, sodium 139, potassium 4.6, chloride 106, bicarbonate 27, BUN 26, creatinine 1.73, glucose 164.   CT of the abdomen shows no acute pathology.   Urine culture 75,000 gram-negative rods.   HOSPITAL COURSE: A 81 year old male who presented with abdominal pain, found to have constipation but also pyelonephritis.  For further details please see admission H and P.   1.  Abdominal pain. The patient underwent an abdominal CT in the ER which essentially showed no acute abnormality. He has severe sigmoid colon diverticulosis, no evidence of diverticulitis.  His abdominal pain is much improved.  2.  Pyelonephritis.  The patient was diagnosed with pyelonephritis. Urinalysis did show a urinary tract infection. Apparently he had some CVA tenderness which I did not elicit on the day I saw him. His urine culture is growing out 75,000 gram-negative rods.  He is on Bactrim and Levaquin. He will be discharged with Levaquin.  3.  PMR. The patient is on chronic steroids.  4.  Diabetes. The patient will continue outpatient medications.  5.  History of CAD. The patient will continue his outpatient medications.  6.  Likely the patient has chronic kidney, stage III, his creatinine remained stable.   DISCHARGE MEDICATIONS:  1.  Flomax 0.4 mg daily.  2.  Aspirin 81 mg daily.   3.  Xalatan ophthalmic solution at bedtime.  4.  Brimonidine/timolol one drop both eyes b.i.d.  5.  Nitroglycerin sublingual p.r.n. chest pain.  6.  MiraLax 17 grams daily.  7.  Ultram 50 mg t.i.d.  8.  Verapamil 40 mg b.i.d.  9.  Hydralazine 50 mg 0.5 tablet 4 times a day.  10. Prednisone 10 mg every other day.  11. Bimatoprost ophthalmic 0.3% in both eyes at bedtime.  12. Flonase 2 sprays daily.  13. Glipizide 2.5 daily.  14. Insulin 2 units t.i.d.  15. Lisinopril 5 mg daily.  16. Metamucil BID 17. Pravastatin 40 mg at bedtime.  18. Vitamin D3 50,000 international units monthly.  19. Levaquin 250 mg q.24 hours x 12 days.   Discharge with home health, physical therapy, nurse for assessment and gait.   DISCHARGE DIET: ADA diet.   DISCHARGE ACTIVITY: As tolerated.   DISCHARGE SUPPLEMENT: Glucerna daily.   DISCHARGE FOLLOWUP: The patient will have followup with Dr. Tillman Abideichard Letvak in 1 week.   TIME SPENT: 35 minutes. The patient was stable for discharge.  ____________________________ Ross Hefferan P. Juliene PinaMody, MD spm:lt D: 06/01/2014 12:00:55 ET T: 06/01/2014 12:59:29 ET JOB#: [id number]  cc: Caidence Higashi P. Juliene PinaMody, MD, <Dictator> Arrow Tomko P Dalal Livengood MD ELECTRONICALLY SIGNED 06/02/2014 13:00

## 2015-03-08 ENCOUNTER — Encounter: Payer: Self-pay | Admitting: Emergency Medicine

## 2015-03-08 ENCOUNTER — Other Ambulatory Visit: Payer: Self-pay

## 2015-03-08 ENCOUNTER — Emergency Department: Payer: Medicare Other

## 2015-03-08 ENCOUNTER — Emergency Department
Admission: EM | Admit: 2015-03-08 | Discharge: 2015-03-08 | Disposition: A | Discharge status: Home / Self Care | Payer: Medicare Other | Attending: Emergency Medicine | Admitting: Emergency Medicine

## 2015-03-08 DIAGNOSIS — N183 Chronic kidney disease, stage 3 (moderate): Secondary | ICD-10-CM | POA: Insufficient documentation

## 2015-03-08 DIAGNOSIS — R1011 Right upper quadrant pain: Secondary | ICD-10-CM | POA: Insufficient documentation

## 2015-03-08 DIAGNOSIS — Z87891 Personal history of nicotine dependence: Secondary | ICD-10-CM | POA: Diagnosis not present

## 2015-03-08 DIAGNOSIS — Z794 Long term (current) use of insulin: Secondary | ICD-10-CM | POA: Insufficient documentation

## 2015-03-08 DIAGNOSIS — Z7952 Long term (current) use of systemic steroids: Secondary | ICD-10-CM | POA: Diagnosis not present

## 2015-03-08 DIAGNOSIS — Z880 Allergy status to penicillin: Secondary | ICD-10-CM | POA: Insufficient documentation

## 2015-03-08 DIAGNOSIS — R51 Headache: Secondary | ICD-10-CM | POA: Insufficient documentation

## 2015-03-08 DIAGNOSIS — Z79899 Other long term (current) drug therapy: Secondary | ICD-10-CM | POA: Insufficient documentation

## 2015-03-08 DIAGNOSIS — Z792 Long term (current) use of antibiotics: Secondary | ICD-10-CM | POA: Insufficient documentation

## 2015-03-08 DIAGNOSIS — R5381 Other malaise: Secondary | ICD-10-CM | POA: Insufficient documentation

## 2015-03-08 DIAGNOSIS — R1013 Epigastric pain: Secondary | ICD-10-CM | POA: Insufficient documentation

## 2015-03-08 DIAGNOSIS — R197 Diarrhea, unspecified: Secondary | ICD-10-CM | POA: Insufficient documentation

## 2015-03-08 DIAGNOSIS — R079 Chest pain, unspecified: Secondary | ICD-10-CM

## 2015-03-08 DIAGNOSIS — E1122 Type 2 diabetes mellitus with diabetic chronic kidney disease: Secondary | ICD-10-CM | POA: Insufficient documentation

## 2015-03-08 DIAGNOSIS — I129 Hypertensive chronic kidney disease with stage 1 through stage 4 chronic kidney disease, or unspecified chronic kidney disease: Secondary | ICD-10-CM | POA: Diagnosis not present

## 2015-03-08 DIAGNOSIS — E1142 Type 2 diabetes mellitus with diabetic polyneuropathy: Secondary | ICD-10-CM | POA: Insufficient documentation

## 2015-03-08 DIAGNOSIS — Z7982 Long term (current) use of aspirin: Secondary | ICD-10-CM | POA: Diagnosis not present

## 2015-03-08 LAB — URINALYSIS COMPLETE WITH MICROSCOPIC (ARMC ONLY)
Bacteria, UA: NONE SEEN
Bilirubin Urine: NEGATIVE
Glucose, UA: 50 mg/dL — AB
Hgb urine dipstick: NEGATIVE
Ketones, ur: NEGATIVE mg/dL
LEUKOCYTES UA: NEGATIVE
Nitrite: NEGATIVE
PH: 5 (ref 5.0–8.0)
PROTEIN: 30 mg/dL — AB
Specific Gravity, Urine: 1.012 (ref 1.005–1.030)
Squamous Epithelial / LPF: NONE SEEN

## 2015-03-08 LAB — CBC WITH DIFFERENTIAL/PLATELET
BASOS ABS: 0 10*3/uL (ref 0–0.1)
Basophils Relative: 0 %
Eosinophils Absolute: 0.3 10*3/uL (ref 0–0.7)
Eosinophils Relative: 3 %
HCT: 37.1 % — ABNORMAL LOW (ref 40.0–52.0)
HEMOGLOBIN: 12.1 g/dL — AB (ref 13.0–18.0)
Lymphs Abs: 2.1 10*3/uL (ref 1.0–3.6)
MCH: 28.8 pg (ref 26.0–34.0)
MCHC: 32.4 g/dL (ref 32.0–36.0)
MCV: 88.9 fL (ref 80.0–100.0)
MONO ABS: 0.8 10*3/uL (ref 0.2–1.0)
Neutro Abs: 7.2 10*3/uL — ABNORMAL HIGH (ref 1.4–6.5)
Neutrophils Relative %: 69 %
Platelets: 161 10*3/uL (ref 150–440)
RBC: 4.18 MIL/uL — ABNORMAL LOW (ref 4.40–5.90)
RDW: 12.4 % (ref 11.5–14.5)
WBC: 10.5 10*3/uL (ref 3.8–10.6)

## 2015-03-08 LAB — COMPREHENSIVE METABOLIC PANEL
ALT: 12 U/L — AB (ref 17–63)
AST: 19 U/L (ref 15–41)
Albumin: 3.4 g/dL — ABNORMAL LOW (ref 3.5–5.0)
Alkaline Phosphatase: 53 U/L (ref 38–126)
Anion gap: 8 (ref 5–15)
BILIRUBIN TOTAL: 1.2 mg/dL (ref 0.3–1.2)
BUN: 30 mg/dL — AB (ref 6–20)
CHLORIDE: 107 mmol/L (ref 101–111)
CO2: 25 mmol/L (ref 22–32)
Calcium: 8.9 mg/dL (ref 8.9–10.3)
Creatinine, Ser: 1.73 mg/dL — ABNORMAL HIGH (ref 0.61–1.24)
GFR, EST AFRICAN AMERICAN: 37 mL/min — AB (ref >60)
GFR, EST NON AFRICAN AMERICAN: 32 mL/min — AB (ref >60)
Glucose, Bld: 185 mg/dL — ABNORMAL HIGH (ref 65–99)
Potassium: 4.6 mmol/L (ref 3.5–5.1)
Sodium: 140 mmol/L (ref 135–145)
Total Protein: 7 g/dL (ref 6.5–8.1)

## 2015-03-08 LAB — TROPONIN I: Troponin I: <0.03 ng/mL (ref <0.031)

## 2015-03-08 LAB — LIPASE, BLOOD: LIPASE: 70 U/L — AB (ref 22–51)

## 2015-03-08 MED ORDER — MORPHINE SULFATE 4 MG/ML IJ SOLN
INTRAMUSCULAR | Status: AC
  Filled 2015-03-08: qty 1

## 2015-03-08 MED ORDER — MORPHINE SULFATE 4 MG/ML IJ SOLN
4.0000 mg | Freq: Once | INTRAMUSCULAR | Status: AC
Start: 2015-03-08 — End: 2015-03-08
  Administered 2015-03-08: 4 mg via INTRAVENOUS

## 2015-03-08 MED ORDER — SODIUM CHLORIDE 0.9 % IV BOLUS (SEPSIS)
500.0000 mL | Freq: Once | INTRAVENOUS | Status: AC
  Administered 2015-03-08: 500 mL via INTRAVENOUS

## 2015-03-08 MED ORDER — MORPHINE SULFATE 2 MG/ML IJ SOLN
INTRAMUSCULAR | Status: AC
  Filled 2015-03-08: qty 1

## 2015-03-08 MED ORDER — ONDANSETRON HCL 4 MG/2ML IJ SOLN
4.0000 mg | Freq: Once | INTRAMUSCULAR | Status: AC
  Administered 2015-03-08: 4 mg via INTRAVENOUS

## 2015-03-08 MED ORDER — IOHEXOL 350 MG/ML SOLN
100.0000 mL | Freq: Once | INTRAVENOUS | Status: AC | PRN
Start: 2015-03-08 — End: 2015-03-08
  Administered 2015-03-08: 100 mL via INTRAVENOUS

## 2015-03-08 MED ORDER — MORPHINE SULFATE 2 MG/ML IJ SOLN
2.0000 mg | Freq: Once | INTRAMUSCULAR | Status: AC
  Administered 2015-03-08: 2 mg via INTRAVENOUS

## 2015-03-08 MED ORDER — ONDANSETRON HCL 4 MG PO TABS: 4.0000 mg | ORAL_TABLET | Freq: Every day | ORAL | Status: DC | PRN

## 2015-03-08 MED ORDER — ONDANSETRON HCL 4 MG/2ML IJ SOLN
INTRAMUSCULAR | Status: AC
  Administered 2015-03-08: 4 mg via INTRAVENOUS
  Filled 2015-03-08: qty 2

## 2015-03-08 NOTE — ED Notes (Signed)
Patient transported to Ultrasound 

## 2015-03-08 NOTE — Discharge Instructions (Signed)
It  is important you set up follow-up with your doctor. It is possible the pancreas has some slight inflammation, but appears this is better with treatment in the ER. At present her CAT scan does not show any acute problems that require hospitalization. Since you're feeling much improved we feel it is reasonable to treat to at home with nausea medicine and encourage you to follow up closely with your primary doctor and set up follow-up with gastroenterology.  Please return to the ER right away if you develop any trouble breathing, chest pain, feel as if you may pass out, develop a fever, or other new concerns or symptoms arise.  Abdominal Pain Many things can cause belly (abdominal) pain. Most times, the belly pain is not dangerous. Many cases of belly pain can be watched and treated at home. HOME CARE   Do not take medicines that help you go poop (laxatives) unless told to by your doctor.  Only take medicine as told by your doctor.  Eat or drink as told by your doctor. Your doctor will tell you if you should be on a special diet. GET HELP IF:  You do not know what is causing your belly pain.  You have belly pain while you are sick to your stomach (nauseous) or have runny poop (diarrhea).  You have pain while you pee or poop.  Your belly pain wakes you up at night.  You have belly pain that gets worse or better when you eat.  You have belly pain that gets worse when you eat fatty foods.  You have a fever. GET HELP RIGHT AWAY IF:   Abdominal pain is worsening.  You keep throwing up (vomiting).  The pain changes and is only in the right or left part of the belly.  You have bloody or tarry looking poop. MAKE SURE YOU:   Understand these instructions.  Will watch your condition.  Will get help right away if you are not doing well or get worse. Document Released: 04/11/2008 Document Revised: 10/29/2013 Document Reviewed: 07/03/2013 [facility] Patient Information 2015 [employer]  [place]. This information is not intended to replace advice given to you by your health care provider. Make sure you discuss any questions you have with your health care provider.

## 2015-03-08 NOTE — ED Notes (Signed)
Patient transported to CT 

## 2015-03-08 NOTE — ED Provider Notes (Signed)
[facility] Emergency Department Provider Note    ____________________________________________  Time seen: 0520  I have reviewed the triage vital signs and the nursing notes.   HISTORY  Chief Complaint Abdominal Pain       HPI Evan [name] is a 81 y.o. male who presents with one-week history of epigastric/chest pain. Patient reports "not feeling good", having diarrhea, feeling like he can't burp. He also endorses decreased urine output despite drinking plenty of water. Patient denies fevers, chills, nausea, vomiting, shortness of breath. Describes aching pain to substernal chest/epigastrium, rates pain 6 out of 10.     Past Medical History  Diagnosis Date  . CAD (coronary artery disease)   . Diabetes mellitus   . GERD (gastroesophageal reflux disease)   . Hyperlipidemia   . Arthritis   . Hypertension   . BPH (benign prostatic hypertrophy)   . Degenerative disc disease   . Polymyalgia rheumatica   . CVA (cerebral vascular accident)   . Gastroenteritis   . Chronic kidney disease, stage III (moderate)     Patient Active Problem List   Diagnosis Date Noted  . Effusion of left knee 10/15/2014  . Pyelonephritis 06/12/2014  . Nocturnal hypoxemia 06/12/2014  . Diabetes, polyneuropathy 10/22/2013  . Chronic kidney disease, stage III (moderate) 10/22/2013  . Type 2 diabetes mellitus with renal manifestations, controlled 10/22/2013  . Routine general medical examination at a health care facility 06/05/2013  . Chronic constipation 07/31/2012  . POLYMYALGIA RHEUMATICA 01/25/2010  . Essential hypertension, benign 08/28/2008  . [place] DISEASE, LUMBAR SPINE 08/28/2008  . OSTEOARTHRITIS 05/07/2007  . Type 2 diabetes, controlled, with neuropathy 04/27/2007  . ATHEROSCLEROTIC CARDIOVASCULAR DISEASE 04/27/2007  . BENIGN PROSTATIC HYPERTROPHY 04/27/2007  . HYPERLIPIDEMIA 04/20/2007  . Coronary atherosclerosis of native coronary artery  04/20/2007  . GERD 04/20/2007    Past Surgical History  Procedure Laterality Date  . Cataract extraction  1999 1992    glaucoma  . Angioplasty    . Esophageal dilation    . US echocardiography  04/2004    benign    Current Outpatient Rx  Name  Route  Sig  Dispense  Refill  . aspirin 81 MG tablet   Oral   Take 81 mg by mouth daily.           . BD INSULIN SYRINGE ULTRAFINE 31G X 5/16" 0.3 ML MISC   Intramuscular   Inject 1 Syringe into the muscle 3 (three) times daily.          . Blood Glucose Monitoring Suppl (ONE TOUCH ULTRA 2) W/DEVICE KIT      Use to test blood sugar 3 times daily dx: E11.40   1 each   0   . brimonidine-timolol (COMBIGAN) 0.2-0.5 % ophthalmic solution   Both Eyes   Place 1 drop into both eyes 2 (two) times daily.           Marland Kitchen erythromycin ophthalmic ointment               . GLIPIZIDE XL 2.5 MG 24 hr tablet      TAKE 1 TABLET DAILY   90 tablet   2   . glucose blood (ONE TOUCH ULTRA TEST) test strip      Use as instructed to test blood sugar 3 times daily dx: E11.40   300 each   3   . hydrALAZINE (APRESOLINE) 50 MG tablet      TAKE 1 TABLET FOUR TIMES A DAY  360 tablet   1   . insulin lispro (HUMALOG) 100 UNIT/ML injection   Subcutaneous   Inject 2 Units into the skin 3 (three) times daily before meals. Sliding scale   10 mL   1   . Lancets (ONETOUCH ULTRASOFT) lancets      Use as instructed to test blood sugar 3 times daily dx: E11.40   300 each   3   . lisinopril (PRINIVIL,ZESTRIL) 5 MG tablet      TAKE 1 TABLET DAILY   90 tablet   3   . LUMIGAN 0.01 % SOLN               . nitroGLYCERIN (NITROSTAT) 0.4 MG SL tablet   Sublingual   Place 0.4 mg under the tongue every 5 (five) minutes as needed.           . Polyethylene Glycol 3350 (MIRALAX PO)   Oral   Take by mouth.         . pravastatin (PRAVACHOL) 40 MG tablet   Oral   Take 1 tablet (40 mg total) by mouth daily.   90 tablet   3   . predniSONE  (DELTASONE) 10 MG tablet   Oral   Take 1 tablet (10 mg total) by mouth daily.   100 tablet   3   . Psyllium (METAMUCIL) WAFR   Oral   Take 2 Wafers by mouth 2 (two) times daily.         . tamsulosin (FLOMAX) 0.4 MG CAPS capsule      TAKE 1 CAPSULE DAILY   90 capsule   3   . traMADol (ULTRAM) 50 MG tablet   Oral   Take 1 tablet (50 mg total) by mouth 3 (three) times daily as needed.   120 tablet   0   . verapamil (CALAN) 40 MG tablet      TAKE 1 TABLET TWICE A DAY   180 tablet   3   . Vitamin D, Ergocalciferol, (DRISDOL) 50000 UNITS CAPS capsule   Oral   Take 1 capsule (50,000 Units total) by mouth every 30 (thirty) days.   12 capsule   0     Allergies Penicillins  History reviewed. No pertinent family history. Denies cholecystectomy.  Social History History  Substance Use Topics  . Smoking status: Former Games developer  . Smokeless tobacco: Never Used     Comment: quit over 20 years ago  . Alcohol Use: No    Review of Systems  Constitutional: Negative for fever. Eyes: Negative for visual changes. ENT: Negative for sore throat. Cardiovascular: Positive for chest pain. Respiratory: Negative for shortness of breath. Gastrointestinal: Positive for abdominal pain and diarrhea. Genitourinary: Negative for dysuria. Positive for decreased urine output. Musculoskeletal: Negative for back pain. Skin: Negative for rash. Neurological: Positive for headaches. Negative for focal weakness or numbness.   10-point ROS otherwise negative.  ____________________________________________   PHYSICAL EXAM:  VITAL SIGNS: ED Triage Vitals  Enc Vitals Group     BP 03/08/15 0517 171/69 mmHg     Pulse Rate 03/08/15 0517 57     Resp 03/08/15 0529 24     Temp 03/08/15 0517 97.4 F (36.3 C)     Temp Source 03/08/15 0517 Oral     SpO2 03/08/15 0509 98 %     Weight 03/08/15 0517 175 lb (79.379 kg)     Height 03/08/15 0517 6\' 3"  (1.905 m)     Head Cir --  Peak Flow --       Pain Score 03/08/15 0520 9     Pain Loc --      Pain Edu? --      Excl. in [place]? --      Constitutional: Alert and oriented. Appears in mild distress. Eyes: Conjunctivae are normal. PERRL. No scleral icterus. Normal extraocular movements. ENT   Head: Normocephalic and atraumatic.   Nose: No congestion/rhinnorhea.   Mouth/Throat: Mucous membranes are moist.   Neck: No stridor. Hematological/Lymphatic/Immunilogical: No cervical lymphadenopathy. Cardiovascular: Normal rate, regular rhythm. Normal and symmetric distal pulses are present in all extremities. No murmurs, rubs, or gallops. Respiratory: Normal respiratory effort without tachypnea nor retractions. Breath sounds are clear and equal bilaterally. No wheezes/rales/rhonchi. Gastrointestinal: Soft, tender to palpation epigastrium and right upper quadrant without guarding or rebound. No distention. No abdominal bruits or pulsatile masses. There is no CVA tenderness. Genitourinary: Deferred Musculoskeletal: Nontender with normal range of motion in all extremities. No joint effusions.   Right lower leg:  No tenderness or edema.   Left lower leg:  No tenderness or edema. Neurologic:  Normal speech and language. No gross focal neurologic deficits are appreciated. Speech is normal.  Skin:  Skin is warm, dry and intact. No rash noted. Psychiatric: Mood and affect are normal. Speech and behavior are normal. Patient exhibits appropriate insight and judgment.  ____________________________________________   EKG  ED ECG REPORT   Date: 03/08/2015  EKG Time: 0504  Rate: 56  Rhythm: sinus bradycardia, nonspecific ST and T waves changes  Axis: Normal  Intervals:none  ST&T Change: Nonspecific     ____________________________________________    RADIOLOGY  Ultrasound results pending.  ____________________________________________   PROCEDURES  Procedure(s) performed: None  Critical Care performed:  No  ____________________________________________   INITIAL IMPRESSION / ASSESSMENT AND PLAN / ED COURSE  Pertinent labs & imaging results that were available during my care of the patient were reviewed by me and considered in my medical decision making (see chart for details).  81 year old male with substernal chest/epigastric pain times one week associated with diarrhea, decreased urine output and malaise. Check labs including liver functions and lipase; administer analgesia and anti-emetics; ultrasound to evaluate cholecystitis.  ----------------------------------------- 7:10 AM on 03/08/2015 -----------------------------------------  Patient feeling better. Care transferred to Dr. Jacqualine Code. Labs and ultrasound results pending. Disposition pending results and patient's clinical status.  ____________________________________________   FINAL CLINICAL IMPRESSION(S) / ED DIAGNOSES  Final diagnoses:  None     Paulette Blanch, MD 03/08/15 [phone]

## 2015-03-08 NOTE — ED Notes (Signed)
Pt came to ER from home with EMS. Chief complaint of epigastirc/chest pain. Pt reports not feeling good since 03/06/15. Pt reports having diarrhea since 03/06/15. Pt denies N/V, pt reports that "it feels like what I eat and drink is coming back up and I can't burp". Pt also reports having decreased urine output while drinking plenty of water. Pt with hx of renal insufficency Pt rates pain 9/10 at this time. Pt verbalizes in full sentences. No other medical needs verbalized at this time. Pt lives at home alone.

## 2015-03-08 NOTE — ED Provider Notes (Signed)
----------------------------------------- 8:16 AM on 03/08/2015 -----------------------------------------  Assuming care from Dr. Dolores FrameSung.  In short, Evan [name] is a 81 y.o. male with a chief complaint of Abdominal Pain .  Refer to the original H&P for additional details.  The current plan of care is LFTs, lipase, troponin and reevaluate the patient in regards to his epigastric pain. If labs are normal and patient feels improved plan of care is likely to discharge, however we'll reevaluate.Marland Kitchen.  Marland Kitchen.Time stamp patient reports his pain is much improved. He is currently resting, alert easily to voice. At present time his labs were delayed due to new system, but his lipase is elevated at 70. Given his labs and exam most likely being consistent with acute pancreatitis I have opted to obtain CT imaging to further evaluate etiology and also rule out other etiology including dissection as cause for pain.    Results for orders placed or performed during the hospital encounter of 03/08/15  CBC WITH DIFFERENTIAL  Result Value Ref Range   WBC 10.5 3.8 - 10.6 K/uL   RBC 4.18 (L) 4.40 - 5.90 MIL/uL   Hemoglobin 12.1 (L) 13.0 - 18.0 g/dL   HCT 96.037.1 (L) 45.440.0 - 09.852.0 %   MCV 88.9 80.0 - 100.0 fL   MCH 28.8 26.0 - 34.0 pg   MCHC 32.4 32.0 - 36.0 g/dL   RDW 11.912.4 14.711.5 - 82.914.5 %   Platelets 161 150 - 440 K/uL   Neutrophils Relative % 69% %   Neutro Abs 7.2 (H) 1.4 - 6.5 K/uL   Lymphocytes Relative 20% %   Lymphs Abs 2.1 1.0 - 3.6 K/uL   Monocytes Relative 8% %   Monocytes Absolute 0.8 0.2 - 1.0 K/uL   Eosinophils Relative 3% %   Eosinophils Absolute 0.3 0 - 0.7 K/uL   Basophils Relative 0% %   Basophils Absolute 0.0 0 - 0.1 K/uL  Comprehensive metabolic panel  Result Value Ref Range   Sodium 140 135 - 145 mmol/L   Potassium 4.6 3.5 - 5.1 mmol/L   Chloride 107 101 - 111 mmol/L   CO2 25 22 - 32 mmol/L   Glucose, Bld 185 (H) 65 - 99 mg/dL   BUN 30 (H) 6 - 20 mg/dL   Creatinine, Ser 5.621.73 (H) 0.61 -  1.24 mg/dL   Calcium 8.9 8.9 - 13.010.3 mg/dL   Total Protein 7.0 6.5 - 8.1 g/dL   Albumin 3.4 (L) 3.5 - 5.0 g/dL   AST 19 15 - 41 U/L   ALT 12 (L) 17 - 63 U/L   Alkaline Phosphatase 53 38 - 126 U/L   Total Bilirubin 1.2 0.3 - 1.2 mg/dL   GFR calc non Af Amer 32 (L) >60 mL/min   GFR calc Af Amer 37 (L) >60 mL/min   Anion gap 8 5 - 15  Lipase, blood  Result Value Ref Range   Lipase 70 (H) 22 - 51 U/L  Urinalysis complete, with microscopic [facility])  Result Value Ref Range   Color, Urine YELLOW (A) YELLOW   APPearance CLEAR (A) CLEAR   Glucose, UA 50 (A) NEGATIVE mg/dL   Bilirubin Urine NEGATIVE NEGATIVE   Ketones, ur NEGATIVE NEGATIVE mg/dL   Specific Gravity, Urine 1.012 1.005 - 1.030   Hgb urine dipstick NEGATIVE NEGATIVE   pH 5.0 5.0 - 8.0   Protein, ur 30 (A) NEGATIVE mg/dL   Nitrite NEGATIVE NEGATIVE   Leukocytes, UA NEGATIVE NEGATIVE   RBC / HPF 0-5 0 - 5 RBC/hpf  WBC, UA 0-5 0 - 5 WBC/hpf   Bacteria, UA NONE SEEN NONE SEEN   Squamous Epithelial / LPF NONE SEEN NONE SEEN   Hyaline Casts, UA PRESENT   Troponin I  Result Value Ref Range   Troponin I <0.03 <0.031 ng/mL    ----------------------------------------- 2:12 PM on 03/08/2015 -----------------------------------------  Patient reports he feels much improved at present time. He is awake alert in no apparent distress. He states he feels well enough to go home. Denies chest pain, denies pain in his abdomen. Patient states he's feels like he is improved.  CT scan results reviewed with patient and I also spoke with Dr. Fritz Pickerel gastroenterology. Dr. Shelle Iron recommended at this time patient be discharged home, and I'm in agreement this patient's pain is much improved he appears well at this time. He has no CT findings of acute pancreatitis and his lipase is less than double normal limit.  Discussed with patient close return precautions and treatment recommendations. At this time would recommend he continue with Tylenol as  needed if he should have any discomfort, as well as a prescription for Zofran.  Patient is comfortable with plan of care and will follow-up with Dr. Alphonsus Sias.  Blood pressure 179/65, pulse 57, temperature 97.4 F (36.3 C), temperature source Oral, resp. rate 16, height  (1.905 m), weight 175 lb (79.379 kg), SpO2 96 %.  Impression epigastric pain, mild pancreatitis.     Sharyn Creamer, MD 03/08/15 1414

## 2015-03-08 NOTE — ED Notes (Signed)
Pt given meal tray. Plans to discharge.  Pt given paperwork and waiting for daughter at this time

## 2015-03-09 ENCOUNTER — Telehealth: Payer: Self-pay | Admitting: Internal Medicine

## 2015-03-09 NOTE — Telephone Encounter (Signed)
Called him to make sure he is feeling better Had to leave messages at home and cell numbers

## 2015-03-12 ENCOUNTER — Other Ambulatory Visit: Payer: Self-pay | Admitting: Gastroenterology

## 2015-03-12 DIAGNOSIS — R1310 Dysphagia, unspecified: Secondary | ICD-10-CM

## 2015-03-16 ENCOUNTER — Ambulatory Visit
Admission: RE | Admit: 2015-03-16 | Discharge: 2015-03-16 | Disposition: A | Discharge status: Home / Self Care | Payer: Medicare Other | Source: Ambulatory Visit | Attending: Gastroenterology | Admitting: Gastroenterology

## 2015-03-16 DIAGNOSIS — K222 Esophageal obstruction: Secondary | ICD-10-CM | POA: Insufficient documentation

## 2015-03-16 DIAGNOSIS — R1310 Dysphagia, unspecified: Secondary | ICD-10-CM

## 2015-03-18 ENCOUNTER — Encounter: Payer: Self-pay | Admitting: Internal Medicine

## 2015-03-18 ENCOUNTER — Ambulatory Visit (INDEPENDENT_AMBULATORY_CARE_PROVIDER_SITE_OTHER): Payer: Medicare Other | Admitting: Internal Medicine

## 2015-03-18 VITALS — BP 120/60 | HR 64 | Temp 98.3°F | Wt 166.0 lb

## 2015-03-18 DIAGNOSIS — R109 Unspecified abdominal pain: Secondary | ICD-10-CM | POA: Insufficient documentation

## 2015-03-18 DIAGNOSIS — R1084 Generalized abdominal pain: Secondary | ICD-10-CM

## 2015-03-18 NOTE — Progress Notes (Signed)
Subjective:    Patient ID: Evan [name], male    DOB: 02/25/1918, 81 y.o.   MRN: [id number]  HPI Here with niece Evan [name] for ER follow up  Seen 5/1-- having substernal and abdominal pain Had labs, CT scan--no diagnosis No potentially offending foods Didn't appear to be cardiac Was constipated there---now is regular more Takes miralax but not regularly Was given probiotic in ER  [place] [facility] He is not sure why  Sugars running okay  Voiding okay on his meds Feels like he is emptying bladder well  Current Outpatient Prescriptions on File Prior to Visit  Medication Sig Dispense Refill  . aspirin 81 MG tablet Take 81 mg by mouth daily.      . BD INSULIN SYRINGE ULTRAFINE 31G X 5/16" 0.3 ML MISC Inject 1 Syringe into the muscle 3 (three) times daily.     . brimonidine-timolol (COMBIGAN) 0.2-0.5 % ophthalmic solution Place 1 drop into both eyes 2 (two) times daily.      Marland Kitchen. GLIPIZIDE XL 2.5 MG 24 hr tablet TAKE 1 TABLET DAILY 90 tablet 2  . glucose blood (ONE TOUCH ULTRA TEST) test strip Use as instructed to test blood sugar 3 times daily dx: E11.40 300 each 3  . hydrALAZINE (APRESOLINE) 50 MG tablet TAKE 1 TABLET FOUR TIMES A DAY 360 tablet 1  . insulin lispro (HUMALOG) 100 UNIT/ML injection Inject 2 Units into the skin 3 (three) times daily before meals. Sliding scale 10 mL 1  . Lancets (ONETOUCH ULTRASOFT) lancets Use as instructed to test blood sugar 3 times daily dx: E11.40 300 each 3  . lisinopril (PRINIVIL,ZESTRIL) 5 MG tablet TAKE 1 TABLET DAILY 90 tablet 3  . nitroGLYCERIN (NITROSTAT) 0.4 MG SL tablet Place 0.4 mg under the tongue every 5 (five) minutes as needed.      . Polyethylene Glycol 3350 (MIRALAX PO) Take by mouth.    . pravastatin (PRAVACHOL) 40 MG tablet Take 1 tablet (40 mg total) by mouth daily. 90 tablet 3  . predniSONE (DELTASONE) 10 MG tablet Take 1 tablet (10 mg total) by mouth daily. 100 tablet 3  . tamsulosin (FLOMAX) 0.4 MG CAPS capsule TAKE 1 CAPSULE  DAILY 90 capsule 3  . traMADol (ULTRAM) 50 MG tablet Take 1 tablet (50 mg total) by mouth 3 (three) times daily as needed. 120 tablet 0  . verapamil (CALAN) 40 MG tablet TAKE 1 TABLET TWICE A DAY 180 tablet 3   No current facility-administered medications on file prior to visit.    Allergies  Allergen Reactions  . Penicillins     REACTION: unspecified    Past Medical History  Diagnosis Date  . CAD (coronary artery disease)   . Diabetes mellitus   . GERD (gastroesophageal reflux disease)   . Hyperlipidemia   . Arthritis   . Hypertension   . BPH (benign prostatic hypertrophy)   . Degenerative disc disease   . Polymyalgia rheumatica   . CVA (cerebral vascular accident)   . Gastroenteritis   . Chronic kidney disease, stage III (moderate)     Past Surgical History  Procedure Laterality Date  . Cataract extraction  1999 1992    glaucoma  . Angioplasty    . Esophageal dilation    . [place] echocardiography  04/2004    benign    No family history on file.  History   Social History  . Marital Status: Married    Spouse Name: N/A  . Number of Children: N/A  .  Years of Education: N/A   Occupational History  . retired    Social History Main Topics  . Smoking status: Former Games developermoker  . Smokeless tobacco: Never Used     Comment: quit over 20 years ago  . Alcohol Use: No  . Drug Use: No  . Sexual Activity: Not on file   Other Topics Concern  . Not on file   Social History Narrative   Religion affecting care--involved with Church throughout life (deacon, etc)      Has living will    Requests son Karren BurlyDwight  as his health care power of attorney   Requests DNR--- done 10/15/14   No tube feeds if cognitively unaware            Review of Systems Appetite is fine again Weight stable Breathing is okay---gets winded easily though    Objective:   Physical Exam  Constitutional: He appears well-developed and well-nourished. No distress.  Neck: Normal range of motion. Neck  supple. No thyromegaly present.  Cardiovascular: Normal rate, regular rhythm and normal heart sounds.  Exam reveals no gallop.   No murmur heard. Pulmonary/Chest: Effort normal and breath sounds normal. No respiratory distress. He has no wheezes. He has no rales.  Abdominal: Soft. There is no tenderness.  Musculoskeletal: He exhibits no edema or tenderness.  Lymphadenopathy:    He has no cervical adenopathy.  Psychiatric: He has a normal mood and affect. His behavior is normal.          Assessment & Plan:

## 2015-03-18 NOTE — Progress Notes (Signed)
Pre visit review using our clinic review tool, if applicable. No additional management support is needed unless otherwise documented below in the visit note. 

## 2015-03-18 NOTE — Assessment & Plan Note (Signed)
Fortunately better now Reviewed extensive work up in ER I believe he may have had a functional obstruction due to the dicyclomine (which I don't think he was taking just prn) Will have him stop this Take the miralax every day to prevent obstipation I don't think this was diabetic gastroparesis--but could have simulated this due to the dicyclomine

## 2015-03-18 NOTE — Patient Instructions (Signed)
Please stop the dicyclomine. Take the miralax every day.

## 2015-04-09 ENCOUNTER — Ambulatory Visit: Payer: Medicare Other | Admitting: Anesthesiology

## 2015-04-09 ENCOUNTER — Encounter: Payer: Self-pay | Admitting: *Deleted

## 2015-04-09 ENCOUNTER — Encounter
Admission: RE | Disposition: A | Discharge status: Home / Self Care | Payer: Self-pay | Source: Ambulatory Visit | Attending: Gastroenterology

## 2015-04-09 ENCOUNTER — Ambulatory Visit
Admission: RE | Admit: 2015-04-09 | Discharge: 2015-04-09 | Disposition: A | Discharge status: Home / Self Care | Payer: Medicare Other | Source: Ambulatory Visit | Attending: Gastroenterology | Admitting: Gastroenterology

## 2015-04-09 DIAGNOSIS — H409 Unspecified glaucoma: Secondary | ICD-10-CM | POA: Diagnosis not present

## 2015-04-09 DIAGNOSIS — Z794 Long term (current) use of insulin: Secondary | ICD-10-CM | POA: Diagnosis not present

## 2015-04-09 DIAGNOSIS — R42 Dizziness and giddiness: Secondary | ICD-10-CM | POA: Diagnosis not present

## 2015-04-09 DIAGNOSIS — R011 Cardiac murmur, unspecified: Secondary | ICD-10-CM | POA: Insufficient documentation

## 2015-04-09 DIAGNOSIS — N183 Chronic kidney disease, stage 3 (moderate): Secondary | ICD-10-CM | POA: Insufficient documentation

## 2015-04-09 DIAGNOSIS — L299 Pruritus, unspecified: Secondary | ICD-10-CM | POA: Diagnosis not present

## 2015-04-09 DIAGNOSIS — Z8673 Personal history of transient ischemic attack (TIA), and cerebral infarction without residual deficits: Secondary | ICD-10-CM | POA: Diagnosis not present

## 2015-04-09 DIAGNOSIS — K219 Gastro-esophageal reflux disease without esophagitis: Secondary | ICD-10-CM | POA: Diagnosis not present

## 2015-04-09 DIAGNOSIS — Z9849 Cataract extraction status, unspecified eye: Secondary | ICD-10-CM | POA: Diagnosis not present

## 2015-04-09 DIAGNOSIS — E785 Hyperlipidemia, unspecified: Secondary | ICD-10-CM | POA: Diagnosis not present

## 2015-04-09 DIAGNOSIS — Z800 Family history of malignant neoplasm of digestive organs: Secondary | ICD-10-CM | POA: Diagnosis not present

## 2015-04-09 DIAGNOSIS — E119 Type 2 diabetes mellitus without complications: Secondary | ICD-10-CM | POA: Diagnosis not present

## 2015-04-09 DIAGNOSIS — K222 Esophageal obstruction: Secondary | ICD-10-CM | POA: Diagnosis not present

## 2015-04-09 DIAGNOSIS — N400 Enlarged prostate without lower urinary tract symptoms: Secondary | ICD-10-CM | POA: Insufficient documentation

## 2015-04-09 DIAGNOSIS — R05 Cough: Secondary | ICD-10-CM | POA: Insufficient documentation

## 2015-04-09 DIAGNOSIS — M353 Polymyalgia rheumatica: Secondary | ICD-10-CM | POA: Diagnosis not present

## 2015-04-09 DIAGNOSIS — Z87891 Personal history of nicotine dependence: Secondary | ICD-10-CM | POA: Diagnosis not present

## 2015-04-09 DIAGNOSIS — Z7982 Long term (current) use of aspirin: Secondary | ICD-10-CM | POA: Diagnosis not present

## 2015-04-09 DIAGNOSIS — R1310 Dysphagia, unspecified: Secondary | ICD-10-CM | POA: Insufficient documentation

## 2015-04-09 DIAGNOSIS — K31819 Angiodysplasia of stomach and duodenum without bleeding: Secondary | ICD-10-CM | POA: Insufficient documentation

## 2015-04-09 DIAGNOSIS — Z833 Family history of diabetes mellitus: Secondary | ICD-10-CM | POA: Insufficient documentation

## 2015-04-09 DIAGNOSIS — I2510 Atherosclerotic heart disease of native coronary artery without angina pectoris: Secondary | ICD-10-CM | POA: Diagnosis not present

## 2015-04-09 DIAGNOSIS — Z6820 Body mass index (BMI) 20.0-20.9, adult: Secondary | ICD-10-CM | POA: Diagnosis not present

## 2015-04-09 DIAGNOSIS — K529 Noninfective gastroenteritis and colitis, unspecified: Secondary | ICD-10-CM | POA: Diagnosis not present

## 2015-04-09 DIAGNOSIS — Z79899 Other long term (current) drug therapy: Secondary | ICD-10-CM | POA: Diagnosis not present

## 2015-04-09 DIAGNOSIS — I252 Old myocardial infarction: Secondary | ICD-10-CM | POA: Diagnosis not present

## 2015-04-09 DIAGNOSIS — I129 Hypertensive chronic kidney disease with stage 1 through stage 4 chronic kidney disease, or unspecified chronic kidney disease: Secondary | ICD-10-CM | POA: Insufficient documentation

## 2015-04-09 DIAGNOSIS — R490 Dysphonia: Secondary | ICD-10-CM | POA: Insufficient documentation

## 2015-04-09 DIAGNOSIS — R079 Chest pain, unspecified: Secondary | ICD-10-CM | POA: Insufficient documentation

## 2015-04-09 DIAGNOSIS — R634 Abnormal weight loss: Secondary | ICD-10-CM | POA: Diagnosis not present

## 2015-04-09 DIAGNOSIS — R0600 Dyspnea, unspecified: Secondary | ICD-10-CM | POA: Insufficient documentation

## 2015-04-09 DIAGNOSIS — E780 Pure hypercholesterolemia: Secondary | ICD-10-CM | POA: Diagnosis not present

## 2015-04-09 DIAGNOSIS — R682 Dry mouth, unspecified: Secondary | ICD-10-CM | POA: Insufficient documentation

## 2015-04-09 DIAGNOSIS — Z8249 Family history of ischemic heart disease and other diseases of the circulatory system: Secondary | ICD-10-CM | POA: Diagnosis not present

## 2015-04-09 HISTORY — PX: ESOPHAGOGASTRODUODENOSCOPY: SHX5428

## 2015-04-09 HISTORY — PX: SAVORY DILATION: SHX5439

## 2015-04-09 LAB — GLUCOSE, CAPILLARY: Glucose-Capillary: 155 mg/dL — ABNORMAL HIGH (ref 65–99)

## 2015-04-09 SURGERY — EGD (ESOPHAGOGASTRODUODENOSCOPY)
Anesthesia: General

## 2015-04-09 MED ORDER — PROPOFOL INFUSION 10 MG/ML OPTIME
INTRAVENOUS | Status: DC | PRN
  Administered 2015-04-09: 100 ug/kg/min via INTRAVENOUS

## 2015-04-09 MED ORDER — SODIUM CHLORIDE 0.9 % IV SOLN
INTRAVENOUS | Status: DC
  Administered 2015-04-09: 1000 mL via INTRAVENOUS

## 2015-04-09 MED ORDER — LIDOCAINE HCL (CARDIAC) 20 MG/ML IV SOLN
INTRAVENOUS | Status: DC | PRN
  Administered 2015-04-09: 40 mg via INTRAVENOUS

## 2015-04-09 MED ORDER — LIDOCAINE HCL (CARDIAC) 20 MG/ML IV SOLN: INTRAVENOUS | Status: DC | PRN

## 2015-04-09 NOTE — Anesthesia Postprocedure Evaluation (Signed)
  Anesthesia Post-op Note  Patient: Evan [name]  Procedure(s) Performed: Procedure(s): ESOPHAGOGASTRODUODENOSCOPY (EGD) (N/A) SAVORY DILATION (N/A)  Anesthesia type:General  Patient location: PACU  Post pain: Pain level controlled  Post assessment: Post-op Vital signs reviewed, Patient's Cardiovascular Status Stable, Respiratory Function Stable, Patent Airway and No signs of Nausea or vomiting  Post vital signs: Reviewed and stable  Last Vitals:  Filed Vitals:   04/09/15 0955  BP: 141/71  Pulse: 62  Temp: 36.2 C  Resp: 17    Level of consciousness: awake, alert  and patient cooperative  Complications: No apparent anesthesia complications

## 2015-04-09 NOTE — Op Note (Signed)
[facility] Patient Name: Evan [name] Procedure Date: 04/09/2015 9:27 AM MRN: [id number] Account #: [id number] Date of Birth: 02-29-1918 Admit Type: Outpatient Age: 81 Room: [facility] ENDO ROOM 3 Gender: Male Note Status: Finalized Procedure:         Upper GI endoscopy Indications:       Dysphagia Patient Profile:   This is a 81 year old male. Providers:         Rhona Raider. Shelle Iron, MD Referring MD:      Karie Schwalbe, MD (Referring MD) Medicines:         Propofol per Anesthesia Complications:     No immediate complications. Procedure:         Pre-Anesthesia Assessment:                    - Prior to the procedure, a History and Physical was                     performed, and patient medications and allergies were                     reviewed. The patient is competent. The risks and benefits                     of the procedure and the sedation options and risks were                     discussed with the patient. All questions were answered                     and informed consent was obtained. Patient identification                     and proposed procedure were verified by the physician and                     the nurse in the pre-procedure area. Mental Status                     Examination: alert and oriented. Airway Examination:                     normal oropharyngeal airway and neck mobility. Respiratory                     Examination: clear to auscultation. CV Examination: RRR,                     no murmurs, no S3 or S4. Prophylactic Antibiotics: The                     patient does not require prophylactic antibiotics. Prior                     Anticoagulants: The patient has taken no previous                     anticoagulant or antiplatelet agents. ASA Grade                     Assessment: III - A patient with severe systemic disease.                     After reviewing the risks and benefits, the patient was  deemed  in satisfactory condition to undergo the procedure.                     The anesthesia plan was to use monitored anesthesia care                     (MAC). Immediately prior to administration of medications,                     the patient was re-assessed for adequacy to receive                     sedatives. The heart rate, respiratory rate, oxygen                     saturations, blood pressure, adequacy of pulmonary                     ventilation, and response to care were monitored                     throughout the procedure. The physical status of the                     patient was re-assessed after the procedure.                    - Prior to the procedure, a History and Physical was                     performed, and patient medications, allergies and                     sensitivities were reviewed. The patient's tolerance of                     previous anesthesia was reviewed.                    After obtaining informed consent, the endoscope was passed                     under direct vision. Throughout the procedure, the                     patient's blood pressure, pulse, and oxygen saturations                     were monitored continuously. The Olympus GIF-160 endoscope                     (S#. [id number]) was introduced through the mouth, and                     advanced to the second part of duodenum. The upper GI                     endoscopy was accomplished without difficulty. The patient                     tolerated the procedure well. Findings:      A low-grade of narrowing Schatzki ring (acquired) was found at the       gastroesophageal junction. A TTS dilator was passed through the scope.       Dilation with a 15-16.5-18 mm x 5.5 cm CRE balloon (to  a maximum balloon       size of 16.5 mm) dilator was performed.      A few medium-sized angioectasias with no bleeding were found in the       gastric body.      The examined duodenum was normal. Impression:        -  Low-grade of narrowing Schatzki ring. Dilated.                    - A few non-bleeding angioectasias in the stomach.                    - Normal examined duodenum.                    - No specimens collected. Recommendation:    - Observe patient in GI recovery unit.                    - Continue present medications. Continue prilosec.                    - Repeat the upper endoscopy PRN for retreatment.                    - Call GI clinic if swallowing trouble is not improved, or                     if symptoms return.                    - The findings and recommendations were discussed with the                     patient.                    - The findings and recommendations were discussed with the                     patient's family. Procedure Code(s): --- Professional ---                    [phone], Esophagogastroduodenoscopy, flexible, transoral;                     with transendoscopic balloon dilation of esophagus (less                     than 30 mm diameter) CPT copyright 2014 American Medical Association. All rights reserved. The codes documented in this report are preliminary and upon coder review may  be revised to meet current compliance requirements. Kathalene Frames, MD 04/09/2015 9:51:16 AM This report has been signed electronically. Number of Addenda: 0 Note Initiated On: 04/09/2015 9:27 AM      [facility]

## 2015-04-09 NOTE — H&P (Signed)
Primary Care Physician:  Tillman Abideichard Letvak, MD  Pre-Procedure History & Physical: HPI:  Evan [name] is a 81 y.o. male is here for an endoscopy.   Past Medical History  Diagnosis Date  . CAD (coronary artery disease)   . Diabetes mellitus   . GERD (gastroesophageal reflux disease)   . Hyperlipidemia   . Arthritis   . Hypertension   . BPH (benign prostatic hypertrophy)   . Degenerative disc disease   . Polymyalgia rheumatica   . CVA (cerebral vascular accident)   . Gastroenteritis   . Chronic kidney disease, stage III (moderate)     Past Surgical History  Procedure Laterality Date  . Cataract extraction  1999 1992    glaucoma  . Angioplasty    . Esophageal dilation    . [place] echocardiography  04/2004    benign    Prior to Admission medications   Medication Sig Start Date End Date Taking? Authorizing Provider  aspirin 81 MG tablet Take 81 mg by mouth daily.     Yes Historical Provider, MD  brimonidine-timolol (COMBIGAN) 0.2-0.5 % ophthalmic solution Place 1 drop into both eyes 2 (two) times daily.     Yes Historical Provider, MD  dicyclomine (BENTYL) 10 MG capsule Take 10 mg by mouth daily.   Yes Historical Provider, MD  furosemide (LASIX) 20 MG tablet Take 10 mg by mouth daily.   Yes Historical Provider, MD  GLIPIZIDE XL 2.5 MG 24 hr tablet TAKE 1 TABLET DAILY 07/25/14  Yes Karie Schwalbeichard I Letvak, MD  hydrALAZINE (APRESOLINE) 50 MG tablet TAKE 1 TABLET FOUR TIMES A DAY   Yes Karie Schwalbeichard I Letvak, MD  insulin lispro (HUMALOG) 100 UNIT/ML injection Inject 2 Units into the skin 3 (three) times daily before meals. Sliding scale Patient taking differently: Inject into the skin 3 (three) times daily as needed. Sliding scale 08/29/13  Yes Karie Schwalbeichard I Letvak, MD  lisinopril (PRINIVIL,ZESTRIL) 5 MG tablet TAKE 1 TABLET DAILY 08/25/14  Yes Karie Schwalbeichard I Letvak, MD  nitroGLYCERIN (NITROSTAT) 0.4 MG SL tablet Place 0.4 mg under the tongue every 5 (five) minutes as needed.     Yes Historical Provider, MD   omeprazole (PRILOSEC) 40 MG capsule Take 40 mg by mouth daily.  03/12/15  Yes Historical Provider, MD  Polyethylene Glycol 3350 (MIRALAX PO) Take 17 g by mouth daily.    Yes Historical Provider, MD  pravastatin (PRAVACHOL) 40 MG tablet Take 1 tablet (40 mg total) by mouth daily. 06/19/14  Yes Karie Schwalbeichard I Letvak, MD  predniSONE (DELTASONE) 10 MG tablet Take 1 tablet (10 mg total) by mouth daily. 12/05/14  Yes Karie Schwalbeichard I Letvak, MD  tamsulosin (FLOMAX) 0.4 MG CAPS capsule TAKE 1 CAPSULE DAILY 09/01/14  Yes Karie Schwalbeichard I Letvak, MD  Timolol Maleate 0.5 % (DAILY) SOLN Place 1 drop into both eyes daily.   Yes Historical Provider, MD  traMADol (ULTRAM) 50 MG tablet Take 1 tablet (50 mg total) by mouth 3 (three) times daily as needed. 07/31/14  Yes Karie Schwalbeichard I Letvak, MD  verapamil (CALAN) 40 MG tablet TAKE 1 TABLET TWICE A DAY 09/08/14  Yes Karie Schwalbeichard I Letvak, MD  Cholecalciferol 1000 UNITS tablet Take 1,000 Units by mouth daily.     Historical Provider, MD  glucose blood (ONE TOUCH ULTRA TEST) test strip Use as instructed to test blood sugar 3 times daily dx: E11.40 11/13/14   Karie Schwalbeichard I Letvak, MD  Lancets [facility](ONETOUCH ULTRASOFT) lancets Use as instructed to test blood sugar 3 times daily dx: E11.40  11/13/14   Karie Schwalbe, MD    Allergies as of 03/25/2015 - Review Complete 03/18/2015  Allergen Reaction Noted  . Penicillins  12/06/2006    History reviewed. No pertinent family history.  History   Social History  . Marital Status: Married    Spouse Name: N/A  . Number of Children: N/A  . Years of Education: N/A   Occupational History  . retired    Social History Main Topics  . Smoking status: Former Games developer  . Smokeless tobacco: Never Used     Comment: quit over 20 years ago  . Alcohol Use: No  . Drug Use: No  . Sexual Activity: Not on file   Other Topics Concern  . Not on file   Social History Narrative   Religion affecting care--involved with Church throughout life (deacon, etc)      Has living  will    Requests son Karren [name]  as his health care power of attorney   Requests DNR--- done 10/15/14   No tube feeds if cognitively unaware              Physical Exam: BP 142/61 mmHg  Pulse 63  Temp(Src) 96.7 F (35.9 C) (Tympanic)  Resp 16  Ht  (1.905 m)  Wt 168 lb (76.204 kg)  BMI 21.00 kg/m2  SpO2 98% General:   Alert,  pleasant and cooperative in NAD Head:  Normocephalic and atraumatic. Neck:  Supple; no masses or thyromegaly. Lungs:  Clear throughout to auscultation.    Heart:  Regular rate and rhythm. Abdomen:  Soft, nontender and nondistended. Normal bowel sounds, without guarding, and without rebound.   Neurologic:  Alert and  oriented x4;  grossly normal neurologically.  Impression/Plan: Evan [name] is here for an endoscopy to be performed for dysphagia, stricture on barium swallow  Risks, benefits, limitations, and alternatives regarding  endoscopy have been reviewed with the patient.  Questions have been answered.  All parties agreeable.   Elnita Maxwell, MD  04/09/2015, 9:26 AM

## 2015-04-09 NOTE — Anesthesia Preprocedure Evaluation (Addendum)
Anesthesia Evaluation  Patient identified by MRN, date of birth, ID band Patient awake    Reviewed: Allergy & Precautions, NPO status , Patient's Chart, lab work & pertinent test results  History of Anesthesia Complications Negative for: history of anesthetic complications  Airway Mallampati: II  TM Distance: >3 FB Neck ROM: Full    Dental  (+) Chipped, Loose,    Pulmonary neg pulmonary ROS, former smoker,  breath sounds clear to auscultation  Pulmonary exam normal       Cardiovascular hypertension, Pt. on medications + CAD and + Cardiac Stents Normal cardiovascular examRhythm:Regular Rate:Normal     Neuro/Psych Residual R sided weakness CVA, Residual Symptoms negative psych ROS   GI/Hepatic Neg liver ROS, GERD-  Medicated and Poorly Controlled,  Endo/Other  diabetes, Type 2, Insulin Dependent  Renal/GU CRFRenal disease  negative genitourinary   Musculoskeletal  (+) Arthritis -, Polymyalgia rheumatica   Abdominal   Peds negative pediatric ROS (+)  Hematology negative hematology ROS (+)   Anesthesia Other Findings   Reproductive/Obstetrics negative OB ROS                            Anesthesia Physical Anesthesia Plan  ASA: III  Anesthesia Plan: General   Post-op Pain Management:    Induction: Intravenous  Airway Management Planned: Nasal Cannula  Additional Equipment:   Intra-op Plan:   Post-operative Plan:   Informed Consent:   Dental advisory given  Plan Discussed with: CRNA and Surgeon  Anesthesia Plan Comments:         Anesthesia Quick Evaluation

## 2015-04-09 NOTE — Transfer of Care (Signed)
Immediate Anesthesia Transfer of Care Note  Patient: Evan [name]  Procedure(s) Performed: Procedure(s): ESOPHAGOGASTRODUODENOSCOPY (EGD) (N/A) SAVORY DILATION (N/A)  Patient Location: PACU and Endoscopy Unit  Anesthesia Type:General  Level of Consciousness: awake, alert  and oriented  Airway & Oxygen Therapy: Patient Spontanous Breathing and Patient connected to nasal cannula oxygen  Post-op Assessment: Report given to RN and Post -op Vital signs reviewed and stable  Post vital signs: Reviewed and stable  Last Vitals:  Filed Vitals:   04/09/15 0857  BP: 142/61  Pulse:   Temp:   Resp:     Complications: No apparent anesthesia complications

## 2015-04-09 NOTE — Discharge Instructions (Signed)

## 2015-04-10 ENCOUNTER — Encounter: Payer: Self-pay | Admitting: Gastroenterology

## 2015-04-16 ENCOUNTER — Ambulatory Visit: Payer: Medicare Other | Admitting: Internal Medicine

## 2015-04-17 LAB — HM DIABETES EYE EXAM

## 2015-04-23 ENCOUNTER — Telehealth: Payer: Self-pay | Admitting: Internal Medicine

## 2015-04-23 ENCOUNTER — Ambulatory Visit: Payer: Self-pay | Admitting: Internal Medicine

## 2015-04-23 NOTE — Telephone Encounter (Signed)
Patient did not come in for their appointment today for 6 mo follow up  Please let me know if patient needs to be contacted immediately for follow up or no follow up needed. °

## 2015-04-24 NOTE — Telephone Encounter (Signed)
R/s for 6/30

## 2015-04-24 NOTE — Telephone Encounter (Signed)
Please set him up for follow up in the next few weeks No charge for the no show

## 2015-05-01 ENCOUNTER — Encounter: Payer: Self-pay | Admitting: Radiology

## 2015-05-01 ENCOUNTER — Ambulatory Visit (INDEPENDENT_AMBULATORY_CARE_PROVIDER_SITE_OTHER): Payer: Medicare Other | Admitting: Internal Medicine

## 2015-05-01 ENCOUNTER — Encounter: Payer: Self-pay | Admitting: Internal Medicine

## 2015-05-01 VITALS — BP 158/60 | HR 61 | Temp 98.3°F | Wt 162.0 lb

## 2015-05-01 DIAGNOSIS — M353 Polymyalgia rheumatica: Secondary | ICD-10-CM

## 2015-05-01 DIAGNOSIS — E441 Mild protein-calorie malnutrition: Secondary | ICD-10-CM

## 2015-05-01 DIAGNOSIS — N183 Chronic kidney disease, stage 3 unspecified: Secondary | ICD-10-CM

## 2015-05-01 DIAGNOSIS — I2510 Atherosclerotic heart disease of native coronary artery without angina pectoris: Secondary | ICD-10-CM

## 2015-05-01 DIAGNOSIS — E1140 Type 2 diabetes mellitus with diabetic neuropathy, unspecified: Secondary | ICD-10-CM | POA: Diagnosis not present

## 2015-05-01 LAB — CBC WITH DIFFERENTIAL/PLATELET
Basophils Absolute: 0 10*3/uL (ref 0.0–0.1)
Basophils Relative: 0.2 % (ref 0.0–3.0)
Eosinophils Absolute: 0.3 10*3/uL (ref 0.0–0.7)
Eosinophils Relative: 3 % (ref 0.0–5.0)
HCT: 36.2 % — ABNORMAL LOW (ref 39.0–52.0)
Hemoglobin: 11.7 g/dL — ABNORMAL LOW (ref 13.0–17.0)
Lymphocytes Relative: 15.8 % (ref 12.0–46.0)
Lymphs Abs: 1.7 10*3/uL (ref 0.7–4.0)
MCHC: 32.4 g/dL (ref 30.0–36.0)
MCV: 89 fl (ref 78.0–100.0)
MONO ABS: 1 10*3/uL (ref 0.1–1.0)
Monocytes Relative: 9.3 % (ref 3.0–12.0)
Neutro Abs: 7.8 10*3/uL — ABNORMAL HIGH (ref 1.4–7.7)
Neutrophils Relative %: 71.7 % (ref 43.0–77.0)
PLATELETS: 198 10*3/uL (ref 150.0–400.0)
RBC: 4.07 Mil/uL — ABNORMAL LOW (ref 4.22–5.81)
RDW: 12.5 % (ref 11.5–15.5)
WBC: 10.9 10*3/uL — ABNORMAL HIGH (ref 4.0–10.5)

## 2015-05-01 LAB — LIPID PANEL
CHOL/HDL RATIO: 3
Cholesterol: 114 mg/dL (ref 0–200)
HDL: 36 mg/dL — ABNORMAL LOW (ref >39.00)
LDL CALC: 65 mg/dL (ref 0–99)
NonHDL: 78
TRIGLYCERIDES: 67 mg/dL (ref 0.0–149.0)
VLDL: 13.4 mg/dL (ref 0.0–40.0)

## 2015-05-01 LAB — COMPREHENSIVE METABOLIC PANEL
ALBUMIN: 3.3 g/dL — AB (ref 3.5–5.2)
ALK PHOS: 61 U/L (ref 39–117)
ALT: 7 U/L (ref 0–53)
AST: 14 U/L (ref 0–37)
BILIRUBIN TOTAL: 0.8 mg/dL (ref 0.2–1.2)
BUN: 42 mg/dL — ABNORMAL HIGH (ref 6–23)
CO2: 32 mEq/L (ref 19–32)
Calcium: 9 mg/dL (ref 8.4–10.5)
Chloride: 105 mEq/L (ref 96–112)
Creatinine, Ser: 1.92 mg/dL — ABNORMAL HIGH (ref 0.40–1.50)
GFR: 42.07 mL/min — ABNORMAL LOW (ref >60.00)
Glucose, Bld: 190 mg/dL — ABNORMAL HIGH (ref 70–99)
POTASSIUM: 4.6 meq/L (ref 3.5–5.1)
SODIUM: 141 meq/L (ref 135–145)
TOTAL PROTEIN: 6.7 g/dL (ref 6.0–8.3)

## 2015-05-01 LAB — SEDIMENTATION RATE: Sed Rate: 20 mm/hr (ref 0–22)

## 2015-05-01 LAB — T4, FREE: FREE T4: 0.96 ng/dL (ref 0.60–1.60)

## 2015-05-01 LAB — HEMOGLOBIN A1C: HEMOGLOBIN A1C: 7.7 % — AB (ref 4.6–6.5)

## 2015-05-01 LAB — MICROALBUMIN / CREATININE URINE RATIO
Creatinine,U: 92.5 mg/dL
Microalb Creat Ratio: 17.8 mg/g (ref 0.0–30.0)
Microalb, Ur: 16.5 mg/dL — ABNORMAL HIGH (ref 0.0–1.9)

## 2015-05-01 MED ORDER — PREDNISONE 10 MG PO TABS: 10.0000 mg | ORAL_TABLET | ORAL | Status: DC

## 2015-05-01 NOTE — Assessment & Plan Note (Signed)
Highly variable Alone and at his age--must avoid hypoglycemia Goal under 9%

## 2015-05-01 NOTE — Progress Notes (Signed)
Pre visit review using our clinic review tool, if applicable. No additional management support is needed unless otherwise documented below in the visit note. 

## 2015-05-01 NOTE — Assessment & Plan Note (Signed)
Recent 6# weight loss Discussed starting glucerna daily---son will get this

## 2015-05-01 NOTE — Assessment & Plan Note (Signed)
Has lost 6# recently Will add glucerna

## 2015-05-01 NOTE — Assessment & Plan Note (Signed)
No symptoms Will continue the every other day since he relapsed after stopping not long ago

## 2015-05-01 NOTE — Assessment & Plan Note (Signed)
No angina recently Continue current meds

## 2015-05-01 NOTE — Assessment & Plan Note (Signed)
Will recheck labs 

## 2015-05-01 NOTE — Progress Notes (Signed)
Subjective:    Patient ID: Evan [name], male    DOB: 01-20-1918, 81 y.o.   MRN: [id number]  HPI Here for follow up of diabetes and other chronic medical conditions Son is here with him  Prednisone not on his list but he hasn't stopped (just needs refill) No sig myalgias No headaches Vision okay---recent eye exam and Rx for stye  Checks sugars 3-4 times per day Fasting usually under 200 premeals are 200-400--uses humalog if over 250 or so No hypoglycemia Mild pain and numbness persist in feet--no change  No chest pain No SOB Has home health 2 hours per day, 5 days a week--help with instrumental ADLs Stand by assistance for showers-- uses shower chair Had some dizziness one morning--went away on its own. No syncope  Voiding okay Nocturia x 3 is stable  Tries to walk around his yard at times  Current Outpatient Prescriptions on File Prior to Visit  Medication Sig Dispense Refill  . aspirin 81 MG tablet Take 81 mg by mouth daily.      . brimonidine-timolol (COMBIGAN) 0.2-0.5 % ophthalmic solution Place 1 drop into both eyes 2 (two) times daily.      Marland Kitchen GLIPIZIDE XL 2.5 MG 24 hr tablet TAKE 1 TABLET DAILY 90 tablet 2  . glucose blood (ONE TOUCH ULTRA TEST) test strip Use as instructed to test blood sugar 3 times daily dx: E11.40 300 each 3  . hydrALAZINE (APRESOLINE) 50 MG tablet TAKE 1 TABLET FOUR TIMES A DAY 360 tablet 1  . insulin lispro (HUMALOG) 100 UNIT/ML injection Inject 2 Units into the skin 3 (three) times daily before meals. Sliding scale (Patient taking differently: Inject into the skin 3 (three) times daily as needed. Sliding scale) 10 mL 1  . Lancets (ONETOUCH ULTRASOFT) lancets Use as instructed to test blood sugar 3 times daily dx: E11.40 300 each 3  . lisinopril (PRINIVIL,ZESTRIL) 5 MG tablet TAKE 1 TABLET DAILY 90 tablet 3  . nitroGLYCERIN (NITROSTAT) 0.4 MG SL tablet Place 0.4 mg under the tongue every 5 (five) minutes as needed.      . pravastatin  (PRAVACHOL) 40 MG tablet Take 1 tablet (40 mg total) by mouth daily. 90 tablet 3  . tamsulosin (FLOMAX) 0.4 MG CAPS capsule TAKE 1 CAPSULE DAILY 90 capsule 3  . Timolol Maleate 0.5 % (DAILY) SOLN Place 1 drop into both eyes daily.    . traMADol (ULTRAM) 50 MG tablet Take 1 tablet (50 mg total) by mouth 3 (three) times daily as needed. 120 tablet 0  . verapamil (CALAN) 40 MG tablet TAKE 1 TABLET TWICE A DAY 180 tablet 3   No current facility-administered medications on file prior to visit.    Allergies  Allergen Reactions  . Penicillins     REACTION: unspecified    Past Medical History  Diagnosis Date  . CAD (coronary artery disease)   . Diabetes mellitus   . GERD (gastroesophageal reflux disease)   . Hyperlipidemia   . Arthritis   . Hypertension   . BPH (benign prostatic hypertrophy)   . Degenerative disc disease   . Polymyalgia rheumatica   . CVA (cerebral vascular accident)   . Gastroenteritis   . Chronic kidney disease, stage III (moderate)     Past Surgical History  Procedure Laterality Date  . Cataract extraction  1999 1992    glaucoma  . Angioplasty    . Esophageal dilation    . US echocardiography  04/2004    benign  .  Esophagogastroduodenoscopy N/A 04/09/2015    Procedure: ESOPHAGOGASTRODUODENOSCOPY (EGD);  Surgeon: Elnita Maxwell, MD;  Location: [facility] ENDOSCOPY;  Service: Endoscopy;  Laterality: N/A;  . Savory dilation N/A 04/09/2015    Procedure: SAVORY DILATION;  Surgeon: Elnita Maxwell, MD;  Location: [facility] ENDOSCOPY;  Service: Endoscopy;  Laterality: N/A;    No family history on file.  History   Social History  . Marital Status: Married    Spouse Name: N/A  . Number of Children: N/A  . Years of Education: N/A   Occupational History  . retired    Social History Main Topics  . Smoking status: Former Games developer  . Smokeless tobacco: Never Used     Comment: quit over 20 years ago  . Alcohol Use: No  . Drug Use: No  . Sexual Activity: Not on  file   Other Topics Concern  . Not on file   Social History Narrative   Religion affecting care--involved with Church throughout life (deacon, etc)      Has living will    Requests son Karren [name]  as his health care power of attorney   Requests DNR--- done 10/15/14   No tube feeds if cognitively unaware            Review of Systems  Weight is stable Appetite is not great--but he eats what he wants 6# weight loss since last visit Sleep is variable---good and bad nights Uses walker in house     Objective:   Physical Exam  Constitutional: He appears well-developed and well-nourished. No distress.  Neck: Normal range of motion. Neck supple. No thyromegaly present.  Cardiovascular: Normal rate, regular rhythm and normal heart sounds.  Exam reveals no gallop.   No murmur heard. Feet warm Faint pulses on left, absent on right  Pulmonary/Chest: Effort normal. No respiratory distress. He has no wheezes. He has no rales.  Decreased breath sounds at bases  Musculoskeletal:  Trace ankle edema  Lymphadenopathy:    He has no cervical adenopathy.  Skin:  No foot lesions  Psychiatric: He has a normal mood and affect. His behavior is normal.          Assessment & Plan:

## 2015-05-04 ENCOUNTER — Telehealth: Payer: Self-pay

## 2015-05-04 NOTE — Telephone Encounter (Signed)
Evan [name] pts son left v/m; this is FYI for Dr Alphonsus Sias; eye doctor gave pt new med, tobramycin dexameth eye drops.

## 2015-05-04 NOTE — Telephone Encounter (Signed)
Okay noted

## 2015-05-05 ENCOUNTER — Encounter: Payer: Self-pay | Admitting: *Deleted

## 2015-05-07 ENCOUNTER — Ambulatory Visit: Payer: Self-pay | Admitting: Internal Medicine

## 2015-05-18 LAB — HM DIABETES EYE EXAM

## 2015-05-20 ENCOUNTER — Other Ambulatory Visit: Payer: Self-pay | Admitting: Internal Medicine

## 2015-05-29 DIAGNOSIS — Z7689 Persons encountering health services in other specified circumstances: Secondary | ICD-10-CM

## 2015-06-15 ENCOUNTER — Encounter: Payer: Self-pay | Admitting: Internal Medicine

## 2015-06-15 ENCOUNTER — Telehealth: Payer: Self-pay | Admitting: Internal Medicine

## 2015-06-15 NOTE — Telephone Encounter (Signed)
Evan [name] (son) called - he needs a letter stating that a letter is needed for an approval for pt to get a massage.  [facility] is [facility]. His appt is today.   Best callback number is son at  [phone].

## 2015-06-15 NOTE — Telephone Encounter (Signed)
Letter done No charge 

## 2015-06-15 NOTE — Telephone Encounter (Signed)
Yes, the massage therapist is asking for a letter for clearance to get a massage.

## 2015-06-15 NOTE — Telephone Encounter (Signed)
I don't understand this. The letter states that it is safe for him to get a massage? Or what?

## 2015-06-15 NOTE — Telephone Encounter (Signed)
I spoke to patient's son and notified him letter is ready for pick up.

## 2015-07-02 ENCOUNTER — Telehealth: Payer: Self-pay

## 2015-07-02 NOTE — Telephone Encounter (Signed)
Dwight pts son (No DPR signed) left v/m requesting DNR for pts home. Spoke with pt and he has DNR in his home and will let his son know that. Nothing further needed.

## 2015-08-17 ENCOUNTER — Other Ambulatory Visit: Payer: Self-pay | Admitting: Internal Medicine

## 2015-09-01 ENCOUNTER — Telehealth: Payer: Self-pay | Admitting: Internal Medicine

## 2015-09-01 NOTE — Telephone Encounter (Signed)
[facility] Day - Client TELEPHONE ADVICE RECORD [facility]  Patient Name: Evan SacramentoSEAWELL Lapp  DOB: 03/30/1918    Initial Comment Caller states that has urinary frequency and having accidents.    Nurse Assessment  Nurse: Elwyn LadeBurress, RN, Misty StanleyLisa Date/Time (Eastern Time): 09/01/2015 4:07:53 PM  Confirm and document reason for call. If symptomatic, describe symptoms. ---Caller states has urinary frequency and lost control of bladder 4-5 x with burning w/ urination; sx began this am  Has the patient traveled out of the country within the last 30 days? ---Not Applicable  Does the patient have any new or worsening symptoms? ---Yes  Will a triage be completed? ---Yes  Related visit to physician within the last 2 weeks? ---N/A  Does the PT have any chronic conditions? (i.e. diabetes, asthma, etc.) ---Yes  List chronic conditions. ---HTN, diabetes, kidney problems     Guidelines    Guideline Title Affirmed Question Affirmed Notes  Urination Pain - Male Diabetes mellitus or weak immune system (e.g., HIV positive, cancer chemotherapy, transplant patient)    Final Disposition User   See Physician within 4 Hours (or PCP triage) Burress, RN, Misty StanleyLisa    Comments  CALLER STATES WOULD NOT BE ABLE TO GET TO OFFICE, EVEN IF APPTS WERE AVAILABLE BY CLOSING; STATES IS GOING TO UC AND NOT SURE WHICH   Referrals  GO TO FACILITY UNDECIDED   Disagree/Comply: Comply

## 2015-09-02 ENCOUNTER — Emergency Department: Payer: Medicare Other

## 2015-09-02 ENCOUNTER — Inpatient Hospital Stay
Admission: EM | Admit: 2015-09-02 | Discharge: 2015-09-05 | DRG: 690 | Disposition: A | Discharge status: Home Health | Payer: Medicare Other | Attending: Internal Medicine | Admitting: Internal Medicine

## 2015-09-02 ENCOUNTER — Telehealth: Payer: Self-pay | Admitting: Internal Medicine

## 2015-09-02 ENCOUNTER — Encounter: Payer: Self-pay | Admitting: Emergency Medicine

## 2015-09-02 DIAGNOSIS — I129 Hypertensive chronic kidney disease with stage 1 through stage 4 chronic kidney disease, or unspecified chronic kidney disease: Secondary | ICD-10-CM | POA: Diagnosis present

## 2015-09-02 DIAGNOSIS — Z8673 Personal history of transient ischemic attack (TIA), and cerebral infarction without residual deficits: Secondary | ICD-10-CM | POA: Diagnosis not present

## 2015-09-02 DIAGNOSIS — N400 Enlarged prostate without lower urinary tract symptoms: Secondary | ICD-10-CM | POA: Diagnosis present

## 2015-09-02 DIAGNOSIS — Z79899 Other long term (current) drug therapy: Secondary | ICD-10-CM

## 2015-09-02 DIAGNOSIS — Z7952 Long term (current) use of systemic steroids: Secondary | ICD-10-CM

## 2015-09-02 DIAGNOSIS — Z794 Long term (current) use of insulin: Secondary | ICD-10-CM

## 2015-09-02 DIAGNOSIS — Z7984 Long term (current) use of oral hypoglycemic drugs: Secondary | ICD-10-CM

## 2015-09-02 DIAGNOSIS — N12 Tubulo-interstitial nephritis, not specified as acute or chronic: Secondary | ICD-10-CM

## 2015-09-02 DIAGNOSIS — R112 Nausea with vomiting, unspecified: Secondary | ICD-10-CM | POA: Diagnosis present

## 2015-09-02 DIAGNOSIS — Z880 Allergy status to penicillin: Secondary | ICD-10-CM | POA: Diagnosis not present

## 2015-09-02 DIAGNOSIS — E785 Hyperlipidemia, unspecified: Secondary | ICD-10-CM | POA: Diagnosis present

## 2015-09-02 DIAGNOSIS — M353 Polymyalgia rheumatica: Secondary | ICD-10-CM | POA: Diagnosis present

## 2015-09-02 DIAGNOSIS — H409 Unspecified glaucoma: Secondary | ICD-10-CM | POA: Diagnosis present

## 2015-09-02 DIAGNOSIS — Z87440 Personal history of urinary (tract) infections: Secondary | ICD-10-CM | POA: Diagnosis not present

## 2015-09-02 DIAGNOSIS — M1990 Unspecified osteoarthritis, unspecified site: Secondary | ICD-10-CM | POA: Diagnosis present

## 2015-09-02 DIAGNOSIS — Z87891 Personal history of nicotine dependence: Secondary | ICD-10-CM

## 2015-09-02 DIAGNOSIS — N183 Chronic kidney disease, stage 3 (moderate): Secondary | ICD-10-CM | POA: Diagnosis present

## 2015-09-02 DIAGNOSIS — E1122 Type 2 diabetes mellitus with diabetic chronic kidney disease: Secondary | ICD-10-CM | POA: Diagnosis present

## 2015-09-02 DIAGNOSIS — Z7982 Long term (current) use of aspirin: Secondary | ICD-10-CM

## 2015-09-02 DIAGNOSIS — E1140 Type 2 diabetes mellitus with diabetic neuropathy, unspecified: Secondary | ICD-10-CM | POA: Diagnosis present

## 2015-09-02 DIAGNOSIS — K219 Gastro-esophageal reflux disease without esophagitis: Secondary | ICD-10-CM | POA: Diagnosis present

## 2015-09-02 DIAGNOSIS — R252 Cramp and spasm: Secondary | ICD-10-CM | POA: Diagnosis present

## 2015-09-02 DIAGNOSIS — I2510 Atherosclerotic heart disease of native coronary artery without angina pectoris: Secondary | ICD-10-CM | POA: Diagnosis present

## 2015-09-02 DIAGNOSIS — N3000 Acute cystitis without hematuria: Secondary | ICD-10-CM | POA: Diagnosis not present

## 2015-09-02 DIAGNOSIS — N390 Urinary tract infection, site not specified: Secondary | ICD-10-CM | POA: Diagnosis present

## 2015-09-02 LAB — BASIC METABOLIC PANEL
Anion gap: 5 (ref 5–15)
BUN: 37 mg/dL — AB (ref 6–20)
CHLORIDE: 104 mmol/L (ref 101–111)
CO2: 27 mmol/L (ref 22–32)
CREATININE: 1.84 mg/dL — AB (ref 0.61–1.24)
Calcium: 8.8 mg/dL — ABNORMAL LOW (ref 8.9–10.3)
GFR calc Af Amer: 34 mL/min — ABNORMAL LOW (ref >60)
GFR calc non Af Amer: 30 mL/min — ABNORMAL LOW (ref >60)
GLUCOSE: 183 mg/dL — AB (ref 65–99)
Potassium: 4.8 mmol/L (ref 3.5–5.1)
Sodium: 136 mmol/L (ref 135–145)

## 2015-09-02 LAB — CBC
HCT: 35.8 % — ABNORMAL LOW (ref 40.0–52.0)
Hemoglobin: 11.6 g/dL — ABNORMAL LOW (ref 13.0–18.0)
MCH: 28.2 pg (ref 26.0–34.0)
MCHC: 32.5 g/dL (ref 32.0–36.0)
MCV: 86.7 fL (ref 80.0–100.0)
PLATELETS: 154 10*3/uL (ref 150–440)
RBC: 4.13 MIL/uL — AB (ref 4.40–5.90)
RDW: 12.4 % (ref 11.5–14.5)
WBC: 17.4 10*3/uL — ABNORMAL HIGH (ref 3.8–10.6)

## 2015-09-02 LAB — URINALYSIS COMPLETE WITH MICROSCOPIC (ARMC ONLY)
BACTERIA UA: NONE SEEN
BILIRUBIN URINE: NEGATIVE
Glucose, UA: NEGATIVE mg/dL
Hgb urine dipstick: NEGATIVE
Ketones, ur: NEGATIVE mg/dL
Nitrite: NEGATIVE
PH: 7 (ref 5.0–8.0)
Protein, ur: 30 mg/dL — AB
Specific Gravity, Urine: 1.009 (ref 1.005–1.030)

## 2015-09-02 LAB — TROPONIN I: Troponin I: <0.03 ng/mL (ref <0.031)

## 2015-09-02 LAB — GLUCOSE, CAPILLARY: GLUCOSE-CAPILLARY: 98 mg/dL (ref 65–99)

## 2015-09-02 MED ORDER — HYDRALAZINE HCL 50 MG PO TABS
50.0000 mg | ORAL_TABLET | Freq: Four times a day (QID) | ORAL | Status: DC
  Administered 2015-09-02 – 2015-09-05 (×9): 50 mg via ORAL
  Filled 2015-09-02 (×10): qty 1

## 2015-09-02 MED ORDER — ACETAMINOPHEN 650 MG RE SUPP: 650.0000 mg | Freq: Four times a day (QID) | RECTAL | Status: DC | PRN

## 2015-09-02 MED ORDER — TIMOLOL MALEATE 0.5 % OP SOLN
1.0000 [drp] | Freq: Two times a day (BID) | OPHTHALMIC | Status: DC
  Administered 2015-09-02 – 2015-09-05 (×6): 1 [drp] via OPHTHALMIC
  Filled 2015-09-02: qty 5

## 2015-09-02 MED ORDER — TAMSULOSIN HCL 0.4 MG PO CAPS
0.4000 mg | ORAL_CAPSULE | Freq: Every day | ORAL | Status: DC
  Administered 2015-09-02 – 2015-09-05 (×4): 0.4 mg via ORAL
  Filled 2015-09-02 (×4): qty 1

## 2015-09-02 MED ORDER — DEXTROSE 5 % IV SOLN
1.0000 g | Freq: Once | INTRAVENOUS | Status: AC
  Administered 2015-09-02: 1 g via INTRAVENOUS
  Filled 2015-09-02: qty 10

## 2015-09-02 MED ORDER — PANTOPRAZOLE SODIUM 40 MG PO TBEC
40.0000 mg | DELAYED_RELEASE_TABLET | Freq: Every day | ORAL | Status: DC
  Administered 2015-09-03 – 2015-09-05 (×3): 40 mg via ORAL
  Filled 2015-09-02 (×4): qty 1

## 2015-09-02 MED ORDER — ONDANSETRON HCL 4 MG PO TABS: 4.0000 mg | ORAL_TABLET | Freq: Four times a day (QID) | ORAL | Status: DC | PRN

## 2015-09-02 MED ORDER — INSULIN ASPART 100 UNIT/ML ~~LOC~~ SOLN
0.0000 [IU] | Freq: Three times a day (TID) | SUBCUTANEOUS | Status: DC
  Administered 2015-09-03: 18:00:00 3 [IU] via SUBCUTANEOUS
  Administered 2015-09-03: 2 [IU] via SUBCUTANEOUS
  Administered 2015-09-03: 5 [IU] via SUBCUTANEOUS
  Administered 2015-09-04: 7 [IU] via SUBCUTANEOUS
  Administered 2015-09-04 (×2): 2 [IU] via SUBCUTANEOUS
  Administered 2015-09-05: 1 [IU] via SUBCUTANEOUS
  Filled 2015-09-02: qty 3
  Filled 2015-09-02: qty 2
  Filled 2015-09-02: qty 1
  Filled 2015-09-02: qty 2
  Filled 2015-09-02: qty 7
  Filled 2015-09-02: qty 2
  Filled 2015-09-02: qty 5

## 2015-09-02 MED ORDER — ACETAMINOPHEN 325 MG PO TABS: 650.0000 mg | ORAL_TABLET | Freq: Four times a day (QID) | ORAL | Status: DC | PRN

## 2015-09-02 MED ORDER — PREDNISONE 10 MG PO TABS
10.0000 mg | ORAL_TABLET | ORAL | Status: DC
  Administered 2015-09-02 – 2015-09-04 (×2): 10 mg via ORAL
  Filled 2015-09-02 (×2): qty 1

## 2015-09-02 MED ORDER — BRIMONIDINE TARTRATE-TIMOLOL 0.2-0.5 % OP SOLN: 1.0000 [drp] | Freq: Two times a day (BID) | OPHTHALMIC | Status: DC

## 2015-09-02 MED ORDER — GLIPIZIDE ER 2.5 MG PO TB24
2.5000 mg | ORAL_TABLET | Freq: Every day | ORAL | Status: DC
  Administered 2015-09-03 – 2015-09-05 (×3): 2.5 mg via ORAL
  Filled 2015-09-02 (×5): qty 1

## 2015-09-02 MED ORDER — TRAMADOL HCL 50 MG PO TABS
50.0000 mg | ORAL_TABLET | Freq: Three times a day (TID) | ORAL | Status: DC | PRN
  Administered 2015-09-02 – 2015-09-03 (×3): 50 mg via ORAL
  Filled 2015-09-02 (×3): qty 1

## 2015-09-02 MED ORDER — ONDANSETRON HCL 4 MG/2ML IJ SOLN: 4.0000 mg | Freq: Four times a day (QID) | INTRAMUSCULAR | Status: DC | PRN

## 2015-09-02 MED ORDER — LISINOPRIL 5 MG PO TABS
5.0000 mg | ORAL_TABLET | Freq: Every day | ORAL | Status: DC
  Administered 2015-09-03 – 2015-09-05 (×3): 5 mg via ORAL
  Filled 2015-09-02 (×3): qty 1

## 2015-09-02 MED ORDER — METHYLPREDNISOLONE SODIUM SUCC 125 MG IJ SOLR
125.0000 mg | Freq: Once | INTRAMUSCULAR | Status: DC
Start: 2015-09-02 — End: 2015-09-02

## 2015-09-02 MED ORDER — NITROGLYCERIN 0.4 MG SL SUBL: 0.4000 mg | SUBLINGUAL_TABLET | SUBLINGUAL | Status: DC | PRN

## 2015-09-02 MED ORDER — LEVOFLOXACIN IN D5W 500 MG/100ML IV SOLN
500.0000 mg | INTRAVENOUS | Status: DC
  Administered 2015-09-02: 500 mg via INTRAVENOUS
  Filled 2015-09-02 (×2): qty 100

## 2015-09-02 MED ORDER — ASPIRIN EC 81 MG PO TBEC
81.0000 mg | DELAYED_RELEASE_TABLET | Freq: Every day | ORAL | Status: DC
  Administered 2015-09-03 – 2015-09-05 (×3): 81 mg via ORAL
  Filled 2015-09-02 (×3): qty 1

## 2015-09-02 MED ORDER — MORPHINE SULFATE (PF) 2 MG/ML IV SOLN
1.0000 mg | Freq: Once | INTRAVENOUS | Status: AC
  Administered 2015-09-02: 1 mg via INTRAVENOUS
  Filled 2015-09-02: qty 1

## 2015-09-02 MED ORDER — BRIMONIDINE TARTRATE 0.2 % OP SOLN
1.0000 [drp] | Freq: Two times a day (BID) | OPHTHALMIC | Status: DC
  Administered 2015-09-02 – 2015-09-05 (×6): 1 [drp] via OPHTHALMIC
  Filled 2015-09-02: qty 5

## 2015-09-02 MED ORDER — ENOXAPARIN SODIUM 30 MG/0.3ML ~~LOC~~ SOLN
30.0000 mg | SUBCUTANEOUS | Status: DC
  Administered 2015-09-02 – 2015-09-04 (×3): 30 mg via SUBCUTANEOUS
  Filled 2015-09-02 (×3): qty 0.3

## 2015-09-02 MED ORDER — PRAVASTATIN SODIUM 40 MG PO TABS
40.0000 mg | ORAL_TABLET | Freq: Every day | ORAL | Status: DC
  Administered 2015-09-02 – 2015-09-04 (×3): 40 mg via ORAL
  Filled 2015-09-02 (×4): qty 1

## 2015-09-02 MED ORDER — MORPHINE SULFATE (PF) 2 MG/ML IV SOLN
1.0000 mg | INTRAVENOUS | Status: DC | PRN
  Administered 2015-09-02 – 2015-09-03 (×5): 1 mg via INTRAVENOUS
  Filled 2015-09-02 (×5): qty 1

## 2015-09-02 MED ORDER — FLUTICASONE PROPIONATE 50 MCG/ACT NA SUSP
1.0000 | Freq: Every day | NASAL | Status: DC
  Administered 2015-09-03 – 2015-09-05 (×3): 1 via NASAL
  Filled 2015-09-02 (×2): qty 16

## 2015-09-02 MED ORDER — VERAPAMIL HCL 40 MG PO TABS
40.0000 mg | ORAL_TABLET | Freq: Two times a day (BID) | ORAL | Status: DC
  Administered 2015-09-02 – 2015-09-05 (×6): 40 mg via ORAL
  Filled 2015-09-02 (×8): qty 1

## 2015-09-02 NOTE — Telephone Encounter (Signed)
Pt called back, [facility] and has a prostate infection, he's feeling a little better, but he's still in bed, they advised him that after he finishes the medications to schedule appt with dr. Alphonsus Siasletvak for recheck and pt states he will call when he's on his last pill.

## 2015-09-02 NOTE — Telephone Encounter (Signed)
Okay It sounds like he had a prostate or urinary infection Glad he has started on an antibiotic

## 2015-09-02 NOTE — Telephone Encounter (Signed)
PLEASE NOTE: All timestamps contained within this report are represented as [place] Standard Time. CONFIDENTIALTY NOTICE: This fax transmission is intended only for the addressee. It contains information that is legally privileged, confidential or otherwise protected from use or disclosure. If you are not the intended recipient, you are strictly prohibited from reviewing, disclosing, copying using or disseminating any of this information or taking any action in reliance on or regarding this information. If you have received this fax in error, please notify us immediately by telephone so that we can arrange for its return to [place]. Phone: [phone], Toll-Free: [phone], Fax: [phone] Page: 1 of 2 Call Id: [id number] [facility] Day - Client TELEPHONE ADVICE RECORD [facility] Patient Name: Evan [name] Gender: Male DOB: Jun 11, 1918 Age: 81 Y 3 M 17 D Return Phone Number: [phone] (Primary) Address: City/State/Zip: [place] Client [facility] - Client Client Site [facility] - Day Physician Tillman Abide Contact Type Call Call Type Triage / Clinical Relationship To Patient Self Appointment Disposition EMR Appointment Not Necessary Info pasted into Epic Yes Return Phone Number [phone] (Primary) Chief Complaint Urinary Incontinence Initial Comment Caller states that has urinary frequency and having accidents. PreDisposition Go to Urgent Care/Walk-In Clinic Nurse Assessment Nurse: Elwyn Lade, RN, Misty Stanley Date/Time Lamount Cohen Time): 09/01/2015 4:07:53 PM Confirm and document reason for call. If symptomatic, describe symptoms. ---Caller states has urinary frequency and lost control of bladder 4-5 x with burning w/ urination; sx began this am Has the patient traveled out of the country within the last 30 days? ---Not Applicable Does the patient have any new or worsening symptoms? ---Yes Will a  triage be completed? ---Yes Related visit to physician within the last 2 weeks? ---N/A Does the PT have any chronic conditions? (i.e. diabetes, asthma, etc.) ---Yes List chronic conditions. ---HTN, diabetes, kidney problems Guidelines Guideline Title Affirmed Question Affirmed Notes Nurse Date/Time Lamount Cohen Time) Urination Pain - Male Diabetes mellitus or weak immune system (e.g., HIV positive, cancer chemotherapy, transplant patient) Burress, RN, Misty Stanley 09/01/2015 4:10:15 PM Disp. Time Lamount Cohen Time) Disposition Final User 09/01/2015 4:17:08 PM See Physician within 4 Hours (or PCP triage) Yes Burress, RN, Gracy Racer Understands: Yes PLEASE NOTE: All timestamps contained within this report are represented as [place] Standard Time. CONFIDENTIALTY NOTICE: This fax transmission is intended only for the addressee. It contains information that is legally privileged, confidential or otherwise protected from use or disclosure. If you are not the intended recipient, you are strictly prohibited from reviewing, disclosing, copying using or disseminating any of this information or taking any action in reliance on or regarding this information. If you have received this fax in error, please notify us immediately by telephone so that we can arrange for its return to [place]. Phone: [phone], Toll-Free: [phone], Fax: [phone] Page: 2 of 2 Call Id: [id number] Disagree/Comply: Comply Care Advice Given Per Guideline SEE PHYSICIAN WITHIN 4 HOURS (or PCP triage): * IF OFFICE WILL BE OPEN: You need to be seen within the next 3 or 4 hours. Call your doctor's office now or as soon as it opens. BRING MEDS: Be certain to bring your medications with you when you go to see the doctor. CALL BACK IF: * You become worse. CARE ADVICE given per Urination Pain - Male (Adult) guideline. After Care Instructions Given Call Event Type User Date / Time Description Comments User: Darrol Poke, RN Date/Time  Lamount Cohen Time): 09/01/2015 4:18:34 PM CALLER STATES WOULD NOT BE ABLE TO  GET TO OFFICE, EVEN IF APPTS WERE AVAILABLE BY CLOSING; STATES IS GOING TO UC AND NOT SURE WHICH Referrals GO TO FACILITY UNDECIDED

## 2015-09-02 NOTE — ED Notes (Signed)
Doctor in with pt and family for admission.  Pt continues to have feet pain.

## 2015-09-02 NOTE — Telephone Encounter (Signed)
Left message on home and VM asking him to return my call, looks like pt went to urgent care

## 2015-09-02 NOTE — ED Notes (Signed)
Pt

## 2015-09-02 NOTE — Telephone Encounter (Signed)
See other phone note

## 2015-09-02 NOTE — Telephone Encounter (Signed)
Please check on him this morning ?

## 2015-09-02 NOTE — ED Notes (Signed)
Pain meds given.  Waiting on admission bed.  Family with pt.

## 2015-09-02 NOTE — Telephone Encounter (Signed)
Please check on him today

## 2015-09-02 NOTE — H&P (Signed)
[facility] [place] at [facility]   PATIENT NAME: Evan [name]    MR#:  [id number]  DATE OF BIRTH:  05/15/1918  DATE OF ADMISSION:  09/02/2015  PRIMARY CARE PHYSICIAN: Tillman Abide, MD   REQUESTING/REFERRING PHYSICIAN: Dr. Langston Masker  CHIEF COMPLAINT:   Chief Complaint  Patient presents with  . Chest Pain  . Urinary Frequency    HISTORY OF PRESENT ILLNESS:  Evan [name]  is a 81 y.o. male with a known history of hypertension, coronary artery disease, diabetes mellitus came in because of  dysuria, frequent urination, patient was given Cipro for UTI , took 2 days of Cipro. Comes in today because of dysuria, bilateral leg pains. WBC 17. Patient denies any nausea vomiting diarrhea does complain of bilateral leg pains. Had some fever today.  PAST MEDICAL HISTORY:   Past Medical History  Diagnosis Date  . CAD (coronary artery disease)   . Diabetes mellitus   . GERD (gastroesophageal reflux disease)   . Hyperlipidemia   . Arthritis   . Hypertension   . BPH (benign prostatic hypertrophy)   . Degenerative disc disease   . Polymyalgia rheumatica (HCC)   . CVA (cerebral vascular accident) (HCC)   . Gastroenteritis   . Chronic kidney disease, stage III (moderate)     PAST SURGICAL HISTOIRY:   Past Surgical History  Procedure Laterality Date  . Cataract extraction  1999 1992    glaucoma  . Angioplasty    . Esophageal dilation    . US echocardiography  04/2004    benign  . Esophagogastroduodenoscopy N/A 04/09/2015    Procedure: ESOPHAGOGASTRODUODENOSCOPY (EGD);  Surgeon: Elnita Maxwell, MD;  Location: [facility] ENDOSCOPY;  Service: Endoscopy;  Laterality: N/A;  . Savory dilation N/A 04/09/2015    Procedure: SAVORY DILATION;  Surgeon: Elnita Maxwell, MD;  Location: [facility] ENDOSCOPY;  Service: Endoscopy;  Laterality: N/A;    SOCIAL HISTORY:   Social History  Substance Use Topics  . Smoking status: Former Games developer  . Smokeless tobacco: Never  Used     Comment: quit over 20 years ago  . Alcohol Use: No    FAMILY HISTORY:  No family history on file.  DRUG ALLERGIES:   Allergies  Allergen Reactions  . Penicillins Rash and Other (See Comments)    Has patient had a PCN reaction causing immediate rash, facial/tongue/throat swelling, SOB or lightheadedness with hypotension: No Has patient had a PCN reaction causing severe rash involving mucus membranes or skin necrosis: No Has patient had a PCN reaction that required hospitalization No Has patient had a PCN reaction occurring within the last 10 years: No If all of the above answers are "NO", then may proceed with Cephalosporin use.     REVIEW OF SYSTEMS:  CONSTITUTIONAL: No fever, fatigue or weakness.  EYES: No blurred or double vision.  EARS, NOSE, AND THROAT: No tinnitus or ear pain.  RESPIRATORY: No cough, shortness of breath, wheezing or hemoptysis.  CARDIOVASCULAR: No chest pain, orthopnea, edema.  GASTROINTESTINAL: No nausea, vomiting, diarrhea or abdominal pain.  GENITOURINARY dysuria, frequent urination..  ENDOCRINE: No polyuria, nocturia,  HEMATOLOGY: No anemia, easy bruising or bleeding SKIN: No rash or lesion. MUSCULOSKELETAL: Complains of leg pains. NEUROLOGIC: No tingling, numbness, weakness.  PSYCHIATRY: No anxiety or depression.   MEDICATIONS AT HOME:   Prior to Admission medications   Medication Sig Start Date End Date Taking? Authorizing Provider  aspirin EC 81 MG tablet Take 81 mg by mouth daily.   Yes Historical  Provider, MD  brimonidine-timolol (COMBIGAN) 0.2-0.5 % ophthalmic solution Place 1 drop into both eyes 2 (two) times daily.   Yes Historical Provider, MD  ciprofloxacin (CIPRO) 250 MG tablet Take 250 mg by mouth 2 (two) times daily. 09/01/15 09/08/15 Yes Historical Provider, MD  fluticasone (FLONASE) 50 MCG/ACT nasal spray Place 1 spray into both nostrils daily.   Yes Historical Provider, MD  glipiZIDE (GLUCOTROL XL) 2.5 MG 24 hr tablet Take  2.5 mg by mouth daily.   Yes Historical Provider, MD  hydrALAZINE (APRESOLINE) 50 MG tablet Take 50 mg by mouth 4 (four) times daily.   Yes Historical Provider, MD  insulin lispro (HUMALOG) 100 UNIT/ML injection Inject into the skin 3 (three) times daily before meals. Pt uses per sliding scale.   Yes Historical Provider, MD  lisinopril (PRINIVIL,ZESTRIL) 5 MG tablet Take 5 mg by mouth daily.   Yes Historical Provider, MD  nitroGLYCERIN (NITROSTAT) 0.4 MG SL tablet Place 0.4 mg under the tongue every 5 (five) minutes as needed for chest pain.    Yes Historical Provider, MD  omeprazole (PRILOSEC) 40 MG capsule Take 40 mg by mouth daily.   Yes Historical Provider, MD  pravastatin (PRAVACHOL) 40 MG tablet Take 40 mg by mouth at bedtime.   Yes Historical Provider, MD  predniSONE (DELTASONE) 10 MG tablet Take 1 tablet (10 mg total) by mouth every other day. 05/01/15  Yes Karie Schwalbe, MD  tamsulosin (FLOMAX) 0.4 MG CAPS capsule Take 0.4 mg by mouth daily.   Yes Historical Provider, MD  traMADol (ULTRAM) 50 MG tablet Take 1 tablet (50 mg total) by mouth 3 (three) times daily as needed. Patient taking differently: Take 50 mg by mouth 3 (three) times daily as needed for moderate pain.  07/31/14  Yes Karie Schwalbe, MD  verapamil (CALAN) 40 MG tablet Take 40 mg by mouth 2 (two) times daily.   Yes Historical Provider, MD      VITAL SIGNS:  Blood pressure 192/72, pulse 72, temperature 99 F (37.2 C), temperature source Oral, resp. rate 20, height  (1.905 m), weight 74.39 kg (164 lb), SpO2 100 %.  PHYSICAL EXAMINATION:  GENERAL:  81 y.o.-year-old patient lying in the bed with no acute distress.  EYES: Pupils equal, round, reactive to light and accommodation. No scleral icterus. Extraocular muscles intact.  HEENT: Head atraumatic, normocephalic. Oropharynx and nasopharynx clear.  NECK:  Supple, no jugular venous distention. No thyroid enlargement, no tenderness.  LUNGS: Normal breath sounds  bilaterally, no wheezing, rales,rhonchi or crepitation. No use of accessory muscles of respiration.  CARDIOVASCULAR: S1, S2 normal. No murmurs, rubs, or gallops.  ABDOMEN: Soft, nontender, nondistended. Bowel sounds present. No organomegaly or mass.  EXTREMITIES: No pedal edema, cyanosis, or clubbing.  NEUROLOGIC: Cranial nerves II through XII are intact. Muscle strength 5/5 in all extremities. Sensation intact. Gait not checked.  PSYCHIATRIC: The patient is alert and oriented x 3. Complains of leg cramps and he is in mild distress secondary to leg cramps. SKIN: No obvious rash, lesion, or ulcer.   LABORATORY PANEL:   CBC  Recent Labs Lab 09/02/15 1551  WBC 17.4*  HGB 11.6*  HCT 35.8*  PLT 154   ------------------------------------------------------------------------------------------------------------------  Chemistries   Recent Labs Lab 09/02/15 1551  NA 136  K 4.8  CL 104  CO2 27  GLUCOSE 183*  BUN 37*  CREATININE 1.84*  CALCIUM 8.8*   ------------------------------------------------------------------------------------------------------------------  Cardiac Enzymes  Recent Labs Lab 09/02/15 1551  TROPONINI <0.03   ------------------------------------------------------------------------------------------------------------------  RADIOLOGY:  Dg Chest 2 View  09/02/2015  CLINICAL DATA:  Chest pain EXAM: CHEST  2 VIEW COMPARISON:  11/25/2013 FINDINGS: Cardiomediastinal silhouette is unremarkable. Hyperinflation and chronic mild interstitial prominence. No acute infiltrate or pulmonary edema. IMPRESSION: No active disease. Again noted hyperinflation and chronic mild interstitial prominence. Electronically Signed   By: Natasha MeadLiviu  Pop M.D.   On: 09/02/2015 16:35    EKG:   Orders placed or performed during the hospital encounter of 09/02/15  . ED EKG within 10 minutes  . ED EKG within 10 minutes  . EKG 12-Lead  . EKG 12-Lead   Normal sinus rhythm 77 bpm no ST-T  changes. IMPRESSION AND PLAN:   #1 UTI and leukocytosis: Elderly male with  UTI<patient did not have enough time with Cipro but came in because of cramps and worsening dysuria, so we will admit him start him on IV Levaquin, IV hydration and follow urine cultures. Continue Flomax. Possible BPH.  #2 leg painssecondary to UTI: I had him morphine for pain relief,  Diabetes mellitus type 2 continue home medications, add insulin with sliding scale coverage #4 chronic kidney disease stage III; renal function is stable Hypertension patient has elevated blood pressure of systolic more than 200 likely secondary to leg pains. Control the pain with morphine, continue home medications including hydralazine, lisinopril. D/w family All the records are reviewed and case discussed with ED provider. Management plans discussed with the patient, family and they are in agreement.  CODE STATUS: full  TOTAL TIME TAKING CARE OF THIS PATIENT: 55minutes.    Katha HammingKONIDENA,Alithea Lapage M.D on 09/02/2015 at 7:01 PM  Between 7am to 6pm - Pager - [phone]  After 6pm go to [email or web address] - password [email or web address]  [facility] [facility]  Office  [phone]  CC: Primary care physician; Tillman Abideichard Letvak, MD  Note: This dictation was prepared with Dragon dictation along with smaller phrase technology. Any transcriptional errors that result from this process are unintentional.

## 2015-09-02 NOTE — ED Notes (Signed)
Pt reports he has a uti and has increased pain..  Pt state he was seen yesterday and [facility].  Pt having urinary incontinence.  Pt lives alone.  No local family.  Pt also has feet pain secondary to neuropathy.  Pt had wet diaper now and is unable to get urine sample now.  siderails up x 2.

## 2015-09-02 NOTE — ED Notes (Signed)
Pt reports seen here last night for UTI, reports urinary incontinence. Pt reports intermittent centralized chest pain with radiation to back, denies shortness of breath, N/V; also reports bilateral knee pain.

## 2015-09-02 NOTE — ED Provider Notes (Addendum)
[facility] Emergency Department Provider Note  ____________________________________________  Time seen: Approximately 530 PM  I have reviewed the triage vital signs and the nursing notes.   HISTORY  Chief Complaint Chest Pain and Urinary Frequency    HPI Evan [name] is a 81 y.o. male with a history of urinary tract infections was presenting today with urinary frequency, incontinence and burning. He was diagnosed yesterday with a urinary tract infection at the [facility] and started on ciprofloxacin. The patient has had 2 doses at this time and says that his frequency and burning with urination is worsening. He is also complaining of diffuse bi-aches including chest pain since this morning. He is also having worsening shortness of breath. Denies any fever at home. He says that the chest pain only hurts when he pushes on it. It is midsternal and nonradiating.   Past Medical History  Diagnosis Date  . CAD (coronary artery disease)   . Diabetes mellitus   . GERD (gastroesophageal reflux disease)   . Hyperlipidemia   . Arthritis   . Hypertension   . BPH (benign prostatic hypertrophy)   . Degenerative disc disease   . Polymyalgia rheumatica (HCC)   . CVA (cerebral vascular accident) (HCC)   . Gastroenteritis   . Chronic kidney disease, stage III (moderate)     Patient Active Problem List   Diagnosis Date Noted  . Mild malnutrition (HCC) 05/01/2015  . Nocturnal hypoxemia 06/12/2014  . Diabetes, polyneuropathy (HCC) 10/22/2013  . Chronic kidney disease, stage III (moderate) 10/22/2013  . Type 2 diabetes mellitus with renal manifestations, controlled 10/22/2013  . Routine general medical examination at a health care facility 06/05/2013  . Chronic constipation 07/31/2012  . POLYMYALGIA RHEUMATICA 01/25/2010  . Essential hypertension, benign 08/28/2008  . DEGENERATIVE DISC DISEASE, LUMBAR SPINE 08/28/2008  . OSTEOARTHRITIS 05/07/2007  . Type 2  diabetes, controlled, with neuropathy (HCC) 04/27/2007  . ATHEROSCLEROTIC CARDIOVASCULAR DISEASE 04/27/2007  . BENIGN PROSTATIC HYPERTROPHY 04/27/2007  . HYPERLIPIDEMIA 04/20/2007  . Coronary atherosclerosis of native coronary artery 04/20/2007  . GERD 04/20/2007    Past Surgical History  Procedure Laterality Date  . Cataract extraction  1999 1992    glaucoma  . Angioplasty    . Esophageal dilation    . [place] echocardiography  04/2004    benign  . Esophagogastroduodenoscopy N/A 04/09/2015    Procedure: ESOPHAGOGASTRODUODENOSCOPY (EGD);  Surgeon: Elnita MaxwellMatthew Gordon Rein, MD;  [facility];  Service: Endoscopy;  Laterality: N/A;  . Savory dilation N/A 04/09/2015    Procedure: SAVORY DILATION;  Surgeon: Elnita MaxwellMatthew Gordon Rein, MD;  [facility];  Service: Endoscopy;  Laterality: N/A;    Current Outpatient Rx  Name  Route  Sig  Dispense  Refill  . aspirin 81 MG tablet   Oral   Take 81 mg by mouth daily.           . brimonidine-timolol (COMBIGAN) 0.2-0.5 % ophthalmic solution   Both Eyes   Place 1 drop into both eyes 2 (two) times daily.           Marland Kitchen. GLIPIZIDE XL 2.5 MG 24 hr tablet      TAKE 1 TABLET DAILY   90 tablet   2   . glucose blood (ONE TOUCH ULTRA TEST) test strip      Use as instructed to test blood sugar 3 times daily dx: E11.40   300 each   3   . hydrALAZINE (APRESOLINE) 50 MG tablet  TAKE 1 TABLET FOUR TIMES A DAY   360 tablet   0   . insulin lispro (HUMALOG) 100 UNIT/ML injection   Subcutaneous   Inject 2 Units into the skin 3 (three) times daily before meals. Sliding scale Patient taking differently: Inject into the skin 3 (three) times daily as needed. Sliding scale   10 mL   1   . Lancets (ONETOUCH ULTRASOFT) lancets      Use as instructed to test blood sugar 3 times daily dx: E11.40   300 each   3   . lisinopril (PRINIVIL,ZESTRIL) 5 MG tablet      TAKE 1 TABLET DAILY   90 tablet   3   . nitroGLYCERIN (NITROSTAT) 0.4  MG SL tablet   Sublingual   Place 0.4 mg under the tongue every 5 (five) minutes as needed.           . pravastatin (PRAVACHOL) 40 MG tablet      TAKE 1 TABLET DAILY   90 tablet   3   . predniSONE (DELTASONE) 10 MG tablet   Oral   Take 1 tablet (10 mg total) by mouth every other day.   100 tablet   1   . tamsulosin (FLOMAX) 0.4 MG CAPS capsule      TAKE 1 CAPSULE DAILY   90 capsule   3   . Timolol Maleate 0.5 % (DAILY) SOLN   Both Eyes   Place 1 drop into both eyes daily.         . traMADol (ULTRAM) 50 MG tablet   Oral   Take 1 tablet (50 mg total) by mouth 3 (three) times daily as needed.   120 tablet   0   . verapamil (CALAN) 40 MG tablet      TAKE 1 TABLET TWICE A DAY   180 tablet   3     Allergies Penicillins  No family history on file.  Social History Social History  Substance Use Topics  . Smoking status: Former Games developer  . Smokeless tobacco: Never Used     Comment: quit over 20 years ago  . Alcohol Use: No    Review of Systems Constitutional: No fever/chills Eyes: No visual changes. ENT: No sore throat. Cardiovascular: As above Respiratory: As above  Gastrointestinal: No abdominal pain.  No nausea, no vomiting.  No diarrhea.  No constipation. Genitourinary: As above  Musculoskeletal: Negative for back pain. Skin: Negative for rash. Neurological: Negative for headaches, focal weakness or numbness.  10-point ROS otherwise negative.  ____________________________________________   PHYSICAL EXAM:  VITAL SIGNS: ED Triage Vitals  Enc Vitals Group     BP 09/02/15 1549 184/97 mmHg     Pulse Rate 09/02/15 1549 81     Resp 09/02/15 1549 20     Temp 09/02/15 1549 99 F (37.2 C)     Temp Source 09/02/15 1549 Oral     SpO2 09/02/15 1549 98 %     Weight 09/02/15 1549 164 lb (74.39 kg)     Height 09/02/15 1549  (1.905 m)     Head Cir --      Peak Flow --      Pain Score 09/02/15 1550 10     Pain Loc --      Pain Edu? --       Excl. in GC? --     Constitutional: Alert and oriented. Well appearing and in no acute distress. Eyes: Conjunctivae are normal. PERRL. EOMI. Head:  Atraumatic. Nose: No congestion/rhinnorhea. Mouth/Throat: Mucous membranes are moist.  Oropharynx non-erythematous. Neck: No stridor.   Cardiovascular: Normal rate, regular rhythm. Grossly normal heart sounds.  Good peripheral circulation. Reproducible tenderness palpation to the mid sternum. Respiratory: Take with increased effort but speaking in full sentences.  No retractions. Lungs CTAB. Gastrointestinal: Soft with mild suprapubic tenderness to palpation. No distention. No abdominal bruits. No lateral CVA tenderness palpation. Musculoskeletal: Tenderness throughout including to the bilateral CVA. Neurologic:  Normal speech and language. No gross focal neurologic deficits are appreciated. No gait instability. Skin:  Skin is warm, dry and intact. No rash noted. Psychiatric: Mood and affect are normal. Speech and behavior are normal.  ____________________________________________   LABS (all labs ordered are listed, but only abnormal results are displayed)  Labs Reviewed  BASIC METABOLIC PANEL - Abnormal; Notable for the following:    Glucose, Bld 183 (*)    BUN 37 (*)    Creatinine, Ser 1.84 (*)    Calcium 8.8 (*)    GFR calc non Af Amer 30 (*)    GFR calc Af Amer 34 (*)    All other components within normal limits  CBC - Abnormal; Notable for the following:    WBC 17.4 (*)    RBC 4.13 (*)    Hemoglobin 11.6 (*)    HCT 35.8 (*)    All other components within normal limits  URINALYSIS COMPLETEWITH MICROSCOPIC ([facility] ONLY) - Abnormal; Notable for the following:    Color, Urine YELLOW (*)    APPearance CLEAR (*)    Protein, ur 30 (*)    Leukocytes, UA TRACE (*)    Squamous Epithelial / LPF 0-5 (*)    All other components within normal limits  URINE CULTURE  TROPONIN I   ____________________________________________  EKG  ED  ECG REPORT I, Arelia Longest, the attending physician, personally viewed and interpreted this ECG.   Date: 09/02/2015  EKG Time: 1556  Rate: 77  Rhythm: normal sinus rhythm  Axis: Normal axis  Intervals:none  ST&T Change: No ST elevation or depression. No abnormal T-wave inversion.  ____________________________________________  RADIOLOGY  No active disease on the chest x-ray. I personally reviewed these films. ____________________________________________   PROCEDURES    ____________________________________________   INITIAL IMPRESSION / ASSESSMENT AND PLAN / ED COURSE  Pertinent labs & imaging results that were available during my care of the patient were reviewed by me and considered in my medical decision making (see chart for details).  ----------------------------------------- 5:56 PM on 09/02/2015 -----------------------------------------  Patient worsening on outpatient antibiotics. We'll admit the patient for likely early pyelonephritis and failure outpatient antibiotics. Updated the patient about this he understands the plan is one to comply. Patient with diffuse body aches including chest pain. At this point his negative troponin and a reassuring EKG. Signed out to Dr.Gouru for admission to the hospital. ____________________________________________   FINAL CLINICAL IMPRESSION(S) / ED DIAGNOSES  Acute pyelonephritis with failure of outpatient antibiotics.    Myrna Blazer, MD 09/02/15 1757  ED ECG REPORT I, Arelia Longest, the attending physician, personally viewed and interpreted this ECG.   Date: 09/02/2015  EKG Time: 1810  Rate: 74  Rhythm: normal sinus rhythm  Axis: Normal axis  Intervals:Prolonged QTC  ST&T Change: T wave inversions in 1 and aVL which were not previously seen on the other EKG because of poor baseline.  These EKG findings are new from 03/08/2015.  Myrna Blazer, MD 09/02/15 [phone]

## 2015-09-03 LAB — BASIC METABOLIC PANEL
ANION GAP: 7 (ref 5–15)
BUN: 36 mg/dL — ABNORMAL HIGH (ref 6–20)
CHLORIDE: 102 mmol/L (ref 101–111)
CO2: 26 mmol/L (ref 22–32)
Calcium: 8.3 mg/dL — ABNORMAL LOW (ref 8.9–10.3)
Creatinine, Ser: 1.86 mg/dL — ABNORMAL HIGH (ref 0.61–1.24)
GFR calc non Af Amer: 29 mL/min — ABNORMAL LOW (ref >60)
GFR, EST AFRICAN AMERICAN: 34 mL/min — AB (ref >60)
Glucose, Bld: 160 mg/dL — ABNORMAL HIGH (ref 65–99)
POTASSIUM: 4.7 mmol/L (ref 3.5–5.1)
SODIUM: 135 mmol/L (ref 135–145)

## 2015-09-03 LAB — GLUCOSE, CAPILLARY
GLUCOSE-CAPILLARY: 221 mg/dL — AB (ref 65–99)
GLUCOSE-CAPILLARY: 219 mg/dL — AB (ref 65–99)
Glucose-Capillary: 160 mg/dL — ABNORMAL HIGH (ref 65–99)
Glucose-Capillary: 272 mg/dL — ABNORMAL HIGH (ref 65–99)

## 2015-09-03 LAB — CBC
HEMATOCRIT: 33.1 % — AB (ref 40.0–52.0)
Hemoglobin: 10.6 g/dL — ABNORMAL LOW (ref 13.0–18.0)
MCH: 28 pg (ref 26.0–34.0)
MCHC: 32 g/dL (ref 32.0–36.0)
MCV: 87.4 fL (ref 80.0–100.0)
Platelets: 135 10*3/uL — ABNORMAL LOW (ref 150–440)
RBC: 3.79 MIL/uL — AB (ref 4.40–5.90)
RDW: 12.4 % (ref 11.5–14.5)
WBC: 17.2 10*3/uL — AB (ref 3.8–10.6)

## 2015-09-03 MED ORDER — DEXTROSE 5 % IV SOLN
1.0000 g | INTRAVENOUS | Status: DC
  Administered 2015-09-03: 1 g via INTRAVENOUS
  Filled 2015-09-03 (×2): qty 10

## 2015-09-03 MED ORDER — GLUCERNA SHAKE PO LIQD
237.0000 mL | Freq: Three times a day (TID) | ORAL | Status: DC
  Administered 2015-09-03 – 2015-09-05 (×5): 237 mL via ORAL

## 2015-09-03 MED ORDER — GABAPENTIN 100 MG PO CAPS
100.0000 mg | ORAL_CAPSULE | Freq: Two times a day (BID) | ORAL | Status: DC
  Administered 2015-09-03 (×2): 100 mg via ORAL
  Filled 2015-09-03 (×3): qty 1

## 2015-09-03 NOTE — Plan of Care (Signed)
Problem: Discharge Progression Outcomes Goal: Discharge plan in place and appropriate Outcome: Progressing Pt is a High Fall Risk From Home Alone, very involved family. Uses walker at home. Hx of HTN, CAD, diabetes, CKD III, HLD, GERD, continue on home medications    Goal: Other Discharge Outcomes/Goals Outcome: Progressing Pt is alert and oriented, c/o pain in bilateral legs. Tramadol given with extra dose of 1 mg morphine with improvement. C/o stomach burning, morphine given with relief. Encouraged to call for assistance.

## 2015-09-03 NOTE — Plan of Care (Signed)
Problem: Discharge Progression Outcomes Goal: Other Discharge Outcomes/Goals Outcome: Progressing Pt admitted for chronic UTI - hasn't c/o chest pain this shift.  Just c/o of leg pain but we've gotten under control w/morphine prn and tramadol.  IV abx changed from levagquin which can cause myopathies to rocephin.  Renal function stable.  VSS - Pt up to chair.  No c/o N&V.  Waiting on blood and urine cultures. Spoke to Pt's [place]. Will ask dr to call him and his caretaker tomorrow.

## 2015-09-03 NOTE — Evaluation (Signed)
Physical Therapy Evaluation Patient Details Name: Evan [name] MRN: [id number] DOB: 06-07-1918 Today's Date: 09/03/2015   History of Present Illness  Pt is a 81 yo male who was admitted to the hospital for UTI and LE pain  Clinical Impression  Pt presents with hx of CAD, DM, GERD, HTN, CVA, and CKD. Examination reveals that pt performs bed mobility at Mod I, transfers at [facility], and ambulation of 150 ft at [facility]. Pt has generally good strength but shows gait deficits that appear a bit parkinsonian in nature. He states that these deficits are not his baseline ambulation. Because of this pt will benefit from skilled PT in order to return to optimal PLOF. Pt will also benefit from further assistance at home for ADLs and supervision for mobility until gait deficits are resolved.     Follow Up Recommendations Home health PT;Supervision for mobility/OOB;Supervision - Intermittent    Equipment Recommendations  None recommended by PT    Recommendations for Other Services       Precautions / Restrictions Precautions Precautions: Fall Restrictions Weight Bearing Restrictions: No      Mobility  Bed Mobility Overal bed mobility: Modified Independent             General bed mobility comments: Pt needs no physical assist to perform general bed mobility.   Transfers Overall transfer level: Needs assistance Equipment used: Rolling walker (2 wheeled) Transfers: Sit to/from Stand Sit to Stand: Min guard         General transfer comment: Pt CGA for safety within the hospital. He shows good strength getting up into standing and lowering into sitting. Pt steady with no overt LOB  Ambulation/Gait Ambulation/Gait assistance: Min guard Ambulation Distance (Feet): 150 Feet Assistive device: Rolling walker (2 wheeled) Gait Pattern/deviations: Decreased step length - right;Decreased step length - left;Step-through pattern Gait velocity: decreased  Gait velocity interpretation: Below normal  speed for age/gender General Gait Details: Pt able to ambulate with fair speed and steadiness with RW. It was noted, however, that pt at times presents with parkinsonian-like gait and needs cues to step bigger, which he responds well to.   Stairs            Wheelchair Mobility    Modified Rankin (Stroke Patients Only)       Balance Overall balance assessment: No apparent balance deficits (not formally assessed)                                           Pertinent Vitals/Pain Pain Assessment: No/denies pain    Home Living Family/patient expects to be discharged to:: Private residence Living Arrangements: Alone Available Help at Discharge: Personal care attendant;Family;Available PRN/intermittently (Currently gets aide 2 hrs/day. Trying to get more time) Type of Home: House Home Access: Ramped entrance     Home Layout: One level Home Equipment: Cane - single point;Walker - 2 wheels (Life alert)      Prior Function Level of Independence: Independent with assistive device(s)         Comments: Pt was ambulating household distances with either walker or cane.      Hand Dominance        Extremity/Trunk Assessment   Upper Extremity Assessment: Overall WFL for tasks assessed           Lower Extremity Assessment: Overall WFL for tasks assessed      Cervical / Trunk Assessment:  Normal  Communication   Communication: No difficulties  Cognition Arousal/Alertness: Awake/alert Behavior During Therapy: WFL for tasks assessed/performed Overall Cognitive Status: Within Functional Limits for tasks assessed                      General Comments      Exercises        Assessment/Plan    PT Assessment Patient needs continued PT services  PT Diagnosis Abnormality of gait   PT Problem List Decreased activity tolerance (decreased gait performance)  PT Treatment Interventions DME instruction;Gait training;Stair training;Functional  mobility training;Therapeutic activities;Therapeutic exercise;Balance training;Neuromuscular re-education   PT Goals (Current goals can be found in the Care Plan section) Acute Rehab PT Goals Patient Stated Goal: to go home PT Goal Formulation: With patient Time For Goal Achievement: 09/17/15 Potential to Achieve Goals: Good    Frequency Min 2X/week   Barriers to discharge        Co-evaluation               End of Session Equipment Utilized During Treatment: Gait belt Activity Tolerance: Patient tolerated treatment well Patient left: in chair;with call bell/phone within reach;with chair alarm set;with SCD's reapplied Nurse Communication: Mobility status         Time: [id number] PT Time Calculation (min) (ACUTE ONLY): 18 min   Charges:         PT G CodesBenna Dunks:        Cachet Mccutchen 09/03/2015, 4:12 PM  Benna Dunksasey Johnchristopher Sarvis, SPT. [phone]

## 2015-09-03 NOTE — Care Management (Signed)
Admitted to this facility with the diagnosis of urinary tract infection. Lives alone. Friend is Herminio HeadsLinda Moore [phone]) Last seen Dr. Alphonsus SiasLetvak 6 months ago. Nursing assistant [facility] 2 hours a day Monday-Friday. Home Health in the past from [facility]. Uses a cane/rolling walker to aid in ambulation. No skilled facility. Life Alert in the home. No home oxygen. Feeds and dresses himself, nursing assistant helps with bath. Friends/family help with errands. Good appetite until last week. One fall 6 months ago. Friends/family will transport. Gwenette GreetBrenda S Misako Roeder RN MSN CCM Care Management [phone]

## 2015-09-03 NOTE — Progress Notes (Signed)
[facility] [place] [facility]   PATIENT NAME: Evan [name]    MR#:  [id number]  DATE OF BIRTH:  01/25/1918  SUBJECTIVE:  CHIEF COMPLAINT:   Chief Complaint  Patient presents with  . Chest Pain  . Urinary Frequency    REVIEW OF SYSTEMS:  ROS  DRUG ALLERGIES:   Allergies  Allergen Reactions  . Penicillins Rash and Other (See Comments)    Has patient had a PCN reaction causing immediate rash, facial/tongue/throat swelling, SOB or lightheadedness with hypotension: No Has patient had a PCN reaction causing severe rash involving mucus membranes or skin necrosis: No Has patient had a PCN reaction that required hospitalization No Has patient had a PCN reaction occurring within the last 10 years: No If all of the above answers are "NO", then may proceed with Cephalosporin use.     VITALS:  Blood pressure 143/66, pulse 63, temperature 97.8 F (36.6 C), temperature source Oral, resp. rate 18, height 6\' 3"  (1.905 m), weight 71.442 kg (157 lb 8 oz), SpO2 100 %.  PHYSICAL EXAMINATION:  Physical Exam  GENERAL:  81 y.o.-year-old patient lying in the bed with no acute distress.  EYES: Pupils equal, round, reactive to light and accommodation. No scleral icterus. Extraocular muscles intact.  HEENT: Head atraumatic, normocephalic. Oropharynx and nasopharynx clear.  NECK:  Supple, no jugular venous distention. No thyroid enlargement, no tenderness.  LUNGS: Normal breath sounds bilaterally, no wheezing, rales,rhonchi or crepitation. No use of accessory muscles of respiration.  CARDIOVASCULAR: S1, S2 normal. No rubs, or gallops. 3/6 systolic murmur present ABDOMEN: Soft, nontender, nondistended. Bowel sounds present. No organomegaly or mass.  EXTREMITIES: No pedal edema, cyanosis, or clubbing.  NEUROLOGIC: Cranial nerves II through XII are intact. Muscle strength 5/5 in all extremities. Sensation intact except both feet. Gait not checked.  PSYCHIATRIC: The  patient is alert and oriented x 3.  SKIN: No obvious rash, lesion, or ulcer.    LABORATORY PANEL:   CBC  Recent Labs Lab 09/03/15 0441  WBC 17.2*  HGB 10.6*  HCT 33.1*  PLT 135*   ------------------------------------------------------------------------------------------------------------------  Chemistries   Recent Labs Lab 09/03/15 0441  NA 135  K 4.7  CL 102  CO2 26  GLUCOSE 160*  BUN 36*  CREATININE 1.86*  CALCIUM 8.3*   ------------------------------------------------------------------------------------------------------------------  Cardiac Enzymes  Recent Labs Lab 09/02/15 1551  TROPONINI <0.03   ------------------------------------------------------------------------------------------------------------------  RADIOLOGY:  Dg Chest 2 View  09/02/2015  CLINICAL DATA:  Chest pain EXAM: CHEST  2 VIEW COMPARISON:  11/25/2013 FINDINGS: Cardiomediastinal silhouette is unremarkable. Hyperinflation and chronic mild interstitial prominence. No acute infiltrate or pulmonary edema. IMPRESSION: No active disease. Again noted hyperinflation and chronic mild interstitial prominence. Electronically Signed   By: Natasha MeadLiviu  Pop M.D.   On: 09/02/2015 16:35    EKG:   Orders placed or performed during the hospital encounter of 09/02/15  . ED EKG within 10 minutes  . ED EKG within 10 minutes  . EKG 12-Lead  . EKG 12-Lead    ASSESSMENT AND PLAN:   81 year old elderly male with past medical history significant for DM, CAD, HTN, CVA, polymyalgia rheumatica, CKD stage 3 comes from home secondary to persistent symptoms of UTI and elevated white count. He was also complaining of nausea vomiting and bilateral leg pain.  1. Acute cystitis-blood and urine cultures ordered. -Changed to Rocephin due to his leg cramps as fluoroquinolones can cause myopathies, tendinitis -IV fluids gently and monitor. -Also continue his Flomax  2.  Leg cramps-has known history of diabetic neuropathy,  worsening right leg cramps with minimal left leg cramps today. -Change antibiotics. -Added gabapentin low-dose. -Physical therapy consulted  3. CK D stage III-monitor, renal function is stable at this time.  4. Diabetes mellitus-on glipizide and also sliding scale insulin.  5. Hypertension-on verapamil, lisinopril and also hydralazine  6. Polymyalgia rheumatica -on chronic low-dose prednisone  7. DVT prophylaxis-on Lovenox   All the records are reviewed and case discussed with Care Management/Social Workerr. Management plans discussed with the patient, family and they are in agreement.  CODE STATUS: Full code  TOTAL TIME TAKING CARE OF THIS PATIENT: 37 minutes.   POSSIBLE D/C IN 1-2 DAYS, DEPENDING ON CLINICAL CONDITION.   Enid Baas M.D on 09/03/2015 at 12:38 PM  Between 7am to 6pm - Pager - [phone]  After 6pm go to [email or web address] - password [email or web address]  [facility]  Office  [phone]  CC: Primary care physician; Tillman Abide, MD

## 2015-09-03 NOTE — Progress Notes (Signed)
Initial Nutrition Assessment   INTERVENTION:   Meals and Snacks: Cater to patient preferences Medical Food Supplement Therapy: will recommend Glucerna Shake po TID, each supplement provides 220 kcal and 10 grams of protein   NUTRITION DIAGNOSIS:   Inadequate oral intake related to acute illness as evidenced by per patient/family report.  GOAL:   Patient will meet greater than or equal to 90% of their needs  MONITOR:    (Energy Intake, Digestive system, Anthropometrics, Glucose Profile)  REASON FOR ASSESSMENT:   Malnutrition Screening Tool    ASSESSMENT:   Pt admitted with pain and UTI. Per pt on visit, pain in mostly in lower extremities and stomach. Pt reports having esophagus dilated in June and since has had no issues swallowing or getting food down.  Past Medical History  Diagnosis Date  . CAD (coronary artery disease)   . Diabetes mellitus   . GERD (gastroesophageal reflux disease)   . Hyperlipidemia   . Arthritis   . Hypertension   . BPH (benign prostatic hypertrophy)   . Degenerative disc disease   . Polymyalgia rheumatica (HCC)   . CVA (cerebral vascular accident) (HCC)   . Gastroenteritis   . Chronic kidney disease, stage III (moderate)    Past Surgical History  Procedure Laterality Date  . Cataract extraction  1999 1992    glaucoma  . Angioplasty    . Esophageal dilation    . US echocardiography  04/2004    benign  . Esophagogastroduodenoscopy N/A 04/09/2015    Procedure: ESOPHAGOGASTRODUODENOSCOPY (EGD);  Surgeon: Elnita Maxwell, MD;  Location: [facility] ENDOSCOPY;  Service: Endoscopy;  Laterality: N/A;  . Savory dilation N/A 04/09/2015    Procedure: SAVORY DILATION;  Surgeon: Elnita Maxwell, MD;  Location: [facility] ENDOSCOPY;  Service: Endoscopy;  Laterality: N/A;   Diet Order:  Diet heart healthy/carb modified Room service appropriate?: Yes; Fluid consistency:: Thin    Current Nutrition: Pt reports eating well this am at breakfast, recorded  100%.  Food/Nutrition-Related History: Pt reports poor po intake for the past 2-3 days PTA, but usually has a good appetite eating 3 meals per day. Pt reports no difficulty swallowing/eating since procedure in June where esophagus was dilated. Pt reports liking and drinking Glucerna at home PTA.   Scheduled Medications:  . aspirin EC  81 mg Oral Daily  . brimonidine  1 drop Both Eyes BID   And  . timolol  1 drop Both Eyes BID  . cefTRIAXone (ROCEPHIN)  IV  1 g Intravenous Q24H  . enoxaparin (LOVENOX) injection  30 mg Subcutaneous Q24H  . feeding supplement (GLUCERNA SHAKE)  237 mL Oral TID WC  . fluticasone  1 spray Each Nare Daily  . gabapentin  100 mg Oral BID  . glipiZIDE  2.5 mg Oral Daily  . hydrALAZINE  50 mg Oral QID  . insulin aspart  0-9 Units Subcutaneous TID WC  . lisinopril  5 mg Oral Daily  . pantoprazole  40 mg Oral Daily  . pravastatin  40 mg Oral QHS  . predniSONE  10 mg Oral QODAY  . tamsulosin  0.4 mg Oral Daily  . verapamil  40 mg Oral BID     Electrolyte/Renal Profile and Glucose Profile:   Recent Labs Lab 09/02/15 1551 09/03/15 0441  NA 136 135  K 4.8 4.7  CL 104 102  CO2 27 26  BUN 37* 36*  CREATININE 1.84* 1.86*  CALCIUM 8.8* 8.3*  GLUCOSE 183* 160*   Protein Profile: No results  for input(s): ALBUMIN in the last 168 hours.  Gastrointestinal Profile: Last BM:  09/01/2015   Nutrition-Focused Physical Exam Findings: Nutrition-Focused physical exam completed. Findings are mild-moderate fat depletion, mild-moderate muscle depletion, and no edema.    Weight Change: Pt reports weight has gone down a couple of pounds. Per [facility] weight of 162lbs, 05/01/2015 (3% weight loss in 4 months)   Skin:  Reviewed, no issues   Height:   Ht Readings from Last 1 Encounters:  09/02/15 6\' 3"  (1.905 m)    Weight:   Wt Readings from Last 1 Encounters:  09/02/15 157 lb 8 oz (71.442 kg)    Wt Readings from Last 10 Encounters:  09/02/15 157 lb 8 oz (71.442  kg)  05/01/15 162 lb (73.483 kg)  04/09/15 168 lb (76.204 kg)  03/18/15 166 lb (75.297 kg)  03/08/15 175 lb (79.379 kg)  10/15/14 173 lb (78.472 kg)  06/12/14 167 lb (75.751 kg)  04/29/14 169 lb (76.658 kg)  04/21/14 168 lb 12 oz (76.544 kg)  11/25/13 161 lb (73.029 kg)    BMI:  Body mass index is 19.69 kg/(m^2).  Estimated Nutritional Needs:   Kcal:  BEE: 1425kcals, TEE: (IF 1.1-1.3)(AF 1.2) [id number]  Protein:  57-71g protein (0.8-1.0g/kg)  Fluid:  1775-214830mL of fluid (25-7130mL/kg)  EDUCATION NEEDS:   No education needs identified at this time   MODERATE Care Level  Leda QuailAllyson Jayan Raymundo, RD, LDN Pager [phone]

## 2015-09-04 LAB — GLUCOSE, CAPILLARY
Glucose-Capillary: 186 mg/dL — ABNORMAL HIGH (ref 65–99)
Glucose-Capillary: 188 mg/dL — ABNORMAL HIGH (ref 65–99)
Glucose-Capillary: 301 mg/dL — ABNORMAL HIGH (ref 65–99)
Glucose-Capillary: 246 mg/dL — ABNORMAL HIGH (ref 65–99)

## 2015-09-04 LAB — CBC
HCT: 31.5 % — ABNORMAL LOW (ref 40.0–52.0)
HEMOGLOBIN: 10.2 g/dL — AB (ref 13.0–18.0)
MCH: 28.3 pg (ref 26.0–34.0)
MCHC: 32.5 g/dL (ref 32.0–36.0)
MCV: 87 fL (ref 80.0–100.0)
Platelets: 118 10*3/uL — ABNORMAL LOW (ref 150–440)
RBC: 3.62 MIL/uL — AB (ref 4.40–5.90)
RDW: 12.2 % (ref 11.5–14.5)
WBC: 9.3 10*3/uL (ref 3.8–10.6)

## 2015-09-04 LAB — BASIC METABOLIC PANEL
Anion gap: 7 (ref 5–15)
BUN: 43 mg/dL — AB (ref 6–20)
CALCIUM: 8.1 mg/dL — AB (ref 8.9–10.3)
CHLORIDE: 100 mmol/L — AB (ref 101–111)
CO2: 25 mmol/L (ref 22–32)
CREATININE: 1.72 mg/dL — AB (ref 0.61–1.24)
GFR, EST AFRICAN AMERICAN: 37 mL/min — AB (ref >60)
GFR, EST NON AFRICAN AMERICAN: 32 mL/min — AB (ref >60)
GLUCOSE: 180 mg/dL — AB (ref 65–99)
POTASSIUM: 4.1 mmol/L (ref 3.5–5.1)
SODIUM: 132 mmol/L — AB (ref 135–145)

## 2015-09-04 LAB — URINE CULTURE

## 2015-09-04 MED ORDER — GABAPENTIN 100 MG PO CAPS
100.0000 mg | ORAL_CAPSULE | Freq: Every day | ORAL | Status: DC
  Administered 2015-09-04 – 2015-09-05 (×2): 100 mg via ORAL
  Filled 2015-09-04 (×2): qty 1

## 2015-09-04 MED ORDER — CEPHALEXIN 250 MG PO CAPS
250.0000 mg | ORAL_CAPSULE | Freq: Three times a day (TID) | ORAL | Status: DC
  Administered 2015-09-04 – 2015-09-05 (×4): 250 mg via ORAL
  Filled 2015-09-04 (×5): qty 1

## 2015-09-04 MED ORDER — GABAPENTIN 100 MG PO CAPS: 200.0000 mg | ORAL_CAPSULE | Freq: Two times a day (BID) | ORAL | Status: DC

## 2015-09-04 MED ORDER — GABAPENTIN 100 MG PO CAPS
200.0000 mg | ORAL_CAPSULE | Freq: Every day | ORAL | Status: DC
  Administered 2015-09-04: 200 mg via ORAL
  Filled 2015-09-04: qty 2

## 2015-09-04 NOTE — Care Management Important Message (Signed)
Important Message  Patient Details  Name: Evan [name] MRN: [id number] Date of Birth: 04/10/1918   Medicare Important Message Given:  Yes-second notification given    [name] 09/04/2015, 10:57 AM

## 2015-09-04 NOTE — Care Management (Signed)
Physical therapy evaluation completed. Recommends home with home health/physical therapy. [facility] in the past, would like [facility] again. Discharge to home tomorrow per Dr. Nemiah CommanderKalisetti. Gwenette GreetBrenda S Naturi Alarid RN MSN CCM Care Management [phone]

## 2015-09-04 NOTE — Plan of Care (Signed)
Problem: Discharge Progression Outcomes Goal: Other Discharge Outcomes/Goals Outcome: Progressing Plan of care progress to goal for: 1. Discharge Plan:         Patient plans to return Home after Discharge. 2. Pain:         Denies Pain. 3. Hemodynamically Stable:         VSS.         Afebrile.         Oral Antibiotics Initiated.         Labs Improving.          4. Complications:         No signs/symptoms of complications noted. 5. Diet:         Healthy Heart, Carb Modified Diet. Tolerating Well.         Thin Liquids. Tolerating Well.         Feeding Supplement Glucerna provided. Tolerating Well. 6. Activity:         One Person Assist.

## 2015-09-04 NOTE — Progress Notes (Signed)
[facility] [place] [facility]   PATIENT NAME: Evan [name]    MR#:  [id number]  DATE OF BIRTH:  02/24/1918  SUBJECTIVE:  CHIEF COMPLAINT:   Chief Complaint  Patient presents with  . Chest Pain  . Urinary Frequency   - worsening right leg pain last night , better this morning - dysuria improving  REVIEW OF SYSTEMS:  Review of Systems  Constitutional: Negative for fever and chills.  HENT: Negative for ear discharge, ear pain and nosebleeds.   Eyes: Negative for blurred vision and double vision.  Respiratory: Negative for cough, shortness of breath and wheezing.   Cardiovascular: Negative for chest pain and palpitations.  Gastrointestinal: Negative for nausea, vomiting, abdominal pain, diarrhea and constipation.  Genitourinary: Positive for frequency. Negative for dysuria.  Musculoskeletal: Positive for myalgias.       Right leg pain in the foot and shin  Neurological: Positive for weakness. Negative for dizziness, seizures and headaches.    DRUG ALLERGIES:   Allergies  Allergen Reactions  . Penicillins Rash and Other (See Comments)    Has patient had a PCN reaction causing immediate rash, facial/tongue/throat swelling, SOB or lightheadedness with hypotension: No Has patient had a PCN reaction causing severe rash involving mucus membranes or skin necrosis: No Has patient had a PCN reaction that required hospitalization No Has patient had a PCN reaction occurring within the last 10 years: No If all of the above answers are "NO", then may proceed with Cephalosporin use.     VITALS:  Blood pressure 138/66, pulse 63, temperature 97.9 F (36.6 C), temperature source Axillary, resp. rate 18, height 6\' 3"  (1.905 m), weight 71.442 kg (157 lb 8 oz), SpO2 99 %.  PHYSICAL EXAMINATION:  Physical Exam  GENERAL:  81 y.o.-year-old patient lying in the bed with no acute distress.  EYES: Pupils equal, round, reactive to light and accommodation. No  scleral icterus. Extraocular muscles intact.  HEENT: Head atraumatic, normocephalic. Oropharynx and nasopharynx clear.  NECK:  Supple, no jugular venous distention. No thyroid enlargement, no tenderness.  LUNGS: Normal breath sounds bilaterally, no wheezing, rales,rhonchi or crepitation. No use of accessory muscles of respiration.  CARDIOVASCULAR: S1, S2 normal. No rubs, or gallops. 3/6 systolic murmur present ABDOMEN: Soft, nontender, nondistended. Bowel sounds present. No organomegaly or mass.  EXTREMITIES: No pedal edema, cyanosis, or clubbing.  NEUROLOGIC: Cranial nerves II through XII are intact. Muscle strength 5/5 in all extremities. Sensation intact except both feet where it is reduced. No feet erythema or tenderness on exam. Gait not checked.  PSYCHIATRIC: The patient is alert and oriented x 3.  SKIN: No obvious rash, lesion, or ulcer.    LABORATORY PANEL:   CBC  Recent Labs Lab 09/04/15 0643  WBC 9.3  HGB 10.2*  HCT 31.5*  PLT 118*   ------------------------------------------------------------------------------------------------------------------  Chemistries   Recent Labs Lab 09/04/15 0643  NA 132*  K 4.1  CL 100*  CO2 25  GLUCOSE 180*  BUN 43*  CREATININE 1.72*  CALCIUM 8.1*   ------------------------------------------------------------------------------------------------------------------  Cardiac Enzymes  Recent Labs Lab 09/02/15 1551  TROPONINI <0.03   ------------------------------------------------------------------------------------------------------------------  RADIOLOGY:  Dg Chest 2 View  09/02/2015  CLINICAL DATA:  Chest pain EXAM: CHEST  2 VIEW COMPARISON:  11/25/2013 FINDINGS: Cardiomediastinal silhouette is unremarkable. Hyperinflation and chronic mild interstitial prominence. No acute infiltrate or pulmonary edema. IMPRESSION: No active disease. Again noted hyperinflation and chronic mild interstitial prominence. Electronically Signed    By: Lanette HampshireLiviu  Pop M.D.  On: 09/02/2015 16:35    EKG:   Orders placed or performed during the hospital encounter of 09/02/15  . ED EKG within 10 minutes  . ED EKG within 10 minutes  . EKG 12-Lead  . EKG 12-Lead    ASSESSMENT AND PLAN:   81 year old elderly male with past medical history significant for DM, CAD, HTN, CVA, polymyalgia rheumatica, CKD stage 3 comes from home secondary to persistent symptoms of UTI and elevated white count. He was also complaining of nausea vomiting and bilateral leg pain.  1. Acute cystitis- urine cultures negative so far. -Changed to Rocephin due to his leg cramps as fluoroquinolones can cause myopathies, tendinitis -Discontinue fluids today -Also continue his Flomax -Discharge on Keflex tomorrow if cultures remain negative  2. Leg cramps-has known history of diabetic neuropathy, worsening right leg cramps with minimal left leg cramps- improving with gabapentin. Dose adjusted.. -Physical therapy consulted  3. CK D stage III-monitor, renal function is stable at this time.  4. Diabetes mellitus-on glipizide and also sliding scale insulin.  5. Hypertension-on verapamil, lisinopril and also hydralazine  6. Polymyalgia rheumatica -on chronic low-dose prednisone  7. DVT prophylaxis-on Lovenox  Appreciate physical therapy consult, will need home health at discharge  Plan of care discussed with patient's daughter Ms. Victorino Dike over the phone   All the records are reviewed and case discussed with Care Management/Social Workerr. Management plans discussed with the patient, family and they are in agreement.  CODE STATUS: Full code  TOTAL TIME TAKING CARE OF THIS PATIENT: 37 minutes.   POSSIBLE D/C TOMORROW, DEPENDING ON CLINICAL CONDITION.   Aiman Sonn M.D on 09/04/2015 at 11:07 AM  Between 7am to 6pm - Pager - [phone]  After 6pm go to [email or web address] - password [email or web address] [facility]  [place] [facility]  Office   [phone]  CC: Primary care physician; Tillman Abide, MD

## 2015-09-04 NOTE — Plan of Care (Signed)
Problem: Discharge Progression Outcomes Goal: Other Discharge Outcomes/Goals Outcome: Progressing Pt is alert and oriented, pain reports feeling much better today, less pain. VSS, encouraged to call for assistance.

## 2015-09-04 NOTE — Progress Notes (Signed)
Physical Therapy Treatment Patient Details Name: Evan [name] MRN: [id number] DOB: 1917/12/29 Today's Date: 09/04/2015    History of Present Illness Pt is a 81 yo male who was admitted to the hospital for UTI and LE pain    PT Comments    Pt improved this afternoon with greater ambulation distance and more consistent gait pattern with cues and RW. Pt performs mobility with good strength and cues. He does get somewhat fatigued after this ambulation. His primary physical deficit is his balance with ambulation which produces minor staggers bilaterally, however these are correctable by pt and he states that this is pretty close to his baseline ambulation. Due to minor balance and fatigue issues, pt will continue to benefit skilled PT so that he can navigate his home safely and return fully to PLOF. Pt is very pleasant to work with and motivated to participate in therapy tasks.   Follow Up Recommendations  Home health PT;Supervision for mobility/OOB;Supervision - Intermittent     Equipment Recommendations  None recommended by PT    Recommendations for Other Services       Precautions / Restrictions Precautions Precautions: Fall Restrictions Weight Bearing Restrictions: No    Mobility  Bed Mobility Overal bed mobility: Modified Independent             General bed mobility comments: Pt needs no physical assist to perform general bed mobility.   Transfers Overall transfer level: Needs assistance Equipment used: Rolling walker (2 wheeled) Transfers: Sit to/from Stand Sit to Stand: Supervision         General transfer comment: Pt shows good strength getting up into standing and lowering into sitting. Pt steady with no overt LOB  Ambulation/Gait Ambulation/Gait assistance: Min guard Ambulation Distance (Feet): 220 Feet Assistive device: Rolling walker (2 wheeled) Gait Pattern/deviations: Step-through pattern;Staggering left;Staggering right Gait velocity: WFL Gait  velocity interpretation: at or above normal speed for age/gender General Gait Details: Pt better with ambulation today with less of a parkinsonian pattern. He is more consistent with gait pattern but still occasionally has mild stagger bilaterally that he self-corrects. Pt states that this is his baseline gait performance     Stairs            Wheelchair Mobility    Modified Rankin (Stroke Patients Only)       Balance Overall balance assessment: Needs assistance (minor staggering with amb, but baseline and correctable )             Standing balance comment: Pt stable with standing balance and RW. With ambulation is when he gets slight staggering, but he is stable and can correct these minor variations                     Cognition Arousal/Alertness: Awake/alert Behavior During Therapy: WFL for tasks assessed/performed Overall Cognitive Status: Within Functional Limits for tasks assessed                      Exercises      General Comments        Pertinent Vitals/Pain Pain Assessment: No/denies pain    Home Living                      Prior Function            PT Goals (current goals can now be found in the care plan section) Acute Rehab PT Goals Patient Stated Goal: to walk around nursing station  PT Goal Formulation: With patient Time For Goal Achievement: 09/17/15 Potential to Achieve Goals: Good Progress towards PT goals: Progressing toward goals    Frequency  Min 2X/week    PT Plan Current plan remains appropriate    Co-evaluation             End of Session Equipment Utilized During Treatment: Gait belt Activity Tolerance: Patient tolerated treatment well Patient left: in chair;with call bell/phone within reach;with chair alarm set;with SCD's reapplied     Time: [id number] PT Time Calculation (min) (ACUTE ONLY): 11 min  Charges:                       G CodesBenna Dunks:      Jana Swartzlander 09/04/2015, 11:28  AM Benna Dunksasey Jessenya Berdan, SPT. [phone]

## 2015-09-05 LAB — GLUCOSE, CAPILLARY: GLUCOSE-CAPILLARY: 126 mg/dL — AB (ref 65–99)

## 2015-09-05 MED ORDER — GABAPENTIN 100 MG PO CAPS: 100.0000 mg | ORAL_CAPSULE | Freq: Every day | ORAL | Status: DC

## 2015-09-05 MED ORDER — GABAPENTIN 100 MG PO CAPS: 200.0000 mg | ORAL_CAPSULE | Freq: Every day | ORAL | Status: DC

## 2015-09-05 MED ORDER — CEPHALEXIN 250 MG PO CAPS: 250.0000 mg | ORAL_CAPSULE | Freq: Three times a day (TID) | ORAL | Status: AC

## 2015-09-05 NOTE — Care Management Note (Signed)
Case Management Note  Patient Details  Name: Prudencio BurlySeawell C Deines MRN: [id number] Date of Birth: 08/16/1918  Subjective/Objective:   Referral for RN and PT faxed and called to [facility].                  Action/Plan:   Expected Discharge Date:                  Expected Discharge Plan:     In-House Referral:     Discharge planning Services     Post Acute Care Choice:    Choice offered to:     DME Arranged:    DME Agency:     HH Arranged:    HH Agency:     Status of Service:     Medicare Important Message Given:  Yes-second notification given Date Medicare IM Given:    Medicare IM give by:    Date Additional Medicare IM Given:    Additional Medicare Important Message give by:     If discussed at Long Length of Stay Meetings, dates discussed:    Additional Comments:  Oleva Koo A, RN 09/05/2015, 9:27 AM

## 2015-09-05 NOTE — Discharge Summary (Signed)
[facility] - [place] at [facility]   PATIENT NAME: Evan [name]    MR#:  [id number]  DATE OF BIRTH:  07/15/1918  DATE OF ADMISSION:  09/02/2015 ADMITTING PHYSICIAN: Katha HammingSnehalatha Konidena, MD  DATE OF DISCHARGE: 09/05/2015  PRIMARY CARE PHYSICIAN: Tillman Abideichard Letvak, MD    ADMISSION DIAGNOSIS:  Pyelonephritis [N12]  DISCHARGE DIAGNOSIS:  Active Problems:   UTI (lower urinary tract infection)   SECONDARY DIAGNOSIS:   Past Medical History  Diagnosis Date  . CAD (coronary artery disease)   . Diabetes mellitus   . GERD (gastroesophageal reflux disease)   . Hyperlipidemia   . Arthritis   . Hypertension   . BPH (benign prostatic hypertrophy)   . Degenerative disc disease   . Polymyalgia rheumatica (HCC)   . CVA (cerebral vascular accident) (HCC)   . Gastroenteritis   . Chronic kidney disease, stage III (moderate)     HOSPITAL COURSE:   81 year old elderly male with past medical history significant for DM, CAD, HTN, CVA, polymyalgia rheumatica, CKD stage 3 comes from home secondary to persistent symptoms of UTI and elevated white count. He was also complaining of nausea vomiting and bilateral leg pain.  1. Acute cystitis- urine cultures negative so far. -received Rocephin due to his leg cramps as fluoroquinolones can cause myopathies, tendinitis - Being discharged on Keflex for 5 more days -Also continue his Flomax  2. Leg cramps-has known history of diabetic neuropathy, worse on the right side than left at baseline. -Started on, continued in the hospital-improving now. Dose adjusted.. -Physical therapy consulted  3. CK D stage III-monitor, renal function is stable at this time.  4. Diabetes mellitus-on glipizide.  5. Hypertension-on verapamil, lisinopril and also hydralazine  6. Polymyalgia rheumatica -on chronic low-dose prednisone  Appreciate physical therapy consult,  home health at discharge  DISCHARGE CONDITIONS:   Stable  CONSULTS  OBTAINED:   None  DRUG ALLERGIES:   Allergies  Allergen Reactions  . Penicillins Rash and Other (See Comments)    Has patient had a PCN reaction causing immediate rash, facial/tongue/throat swelling, SOB or lightheadedness with hypotension: No Has patient had a PCN reaction causing severe rash involving mucus membranes or skin necrosis: No Has patient had a PCN reaction that required hospitalization No Has patient had a PCN reaction occurring within the last 10 years: No If all of the above answers are "NO", then may proceed with Cephalosporin use.     DISCHARGE MEDICATIONS:   Current Discharge Medication List    START taking these medications   Details  cephALEXin (KEFLEX) 250 MG capsule Take 1 capsule (250 mg total) by mouth 3 (three) times daily. Qty: 15 capsule, Refills: 0    !! gabapentin (NEURONTIN) 100 MG capsule Take 1 capsule (100 mg total) by mouth daily after breakfast. Qty: 30 capsule, Refills: 1    !! gabapentin (NEURONTIN) 100 MG capsule Take 2 capsules (200 mg total) by mouth at bedtime. Qty: 30 capsule, Refills: 1     !! - Potential duplicate medications found. Please discuss with provider.    CONTINUE these medications which have NOT CHANGED   Details  aspirin EC 81 MG tablet Take 81 mg by mouth daily.    brimonidine-timolol (COMBIGAN) 0.2-0.5 % ophthalmic solution Place 1 drop into both eyes 2 (two) times daily.    fluticasone (FLONASE) 50 MCG/ACT nasal spray Place 1 spray into both nostrils daily.    glipiZIDE (GLUCOTROL XL) 2.5 MG 24 hr tablet Take 2.5 mg by mouth daily.  hydrALAZINE (APRESOLINE) 50 MG tablet Take 50 mg by mouth 4 (four) times daily.    insulin lispro (HUMALOG) 100 UNIT/ML injection Inject into the skin 3 (three) times daily before meals. Pt uses per sliding scale.    lisinopril (PRINIVIL,ZESTRIL) 5 MG tablet Take 5 mg by mouth daily.    nitroGLYCERIN (NITROSTAT) 0.4 MG SL tablet Place 0.4 mg under the tongue every 5 (five)  minutes as needed for chest pain.     omeprazole (PRILOSEC) 40 MG capsule Take 40 mg by mouth daily.    pravastatin (PRAVACHOL) 40 MG tablet Take 40 mg by mouth at bedtime.    predniSONE (DELTASONE) 10 MG tablet Take 1 tablet (10 mg total) by mouth every other day. Qty: 100 tablet, Refills: 1    tamsulosin (FLOMAX) 0.4 MG CAPS capsule Take 0.4 mg by mouth daily.    timolol (TIMOPTIC) 0.5 % ophthalmic solution Place 1 drop into both eyes daily.    traMADol (ULTRAM) 50 MG tablet Take 1 tablet (50 mg total) by mouth 3 (three) times daily as needed. Qty: 120 tablet, Refills: 0    verapamil (CALAN) 40 MG tablet Take 40 mg by mouth 2 (two) times daily.      STOP taking these medications     ciprofloxacin (CIPRO) 250 MG tablet          DISCHARGE INSTRUCTIONS:   1. PCP follow-up in 1-2 weeks  If you experience worsening of your admission symptoms, develop shortness of breath, life threatening emergency, suicidal or homicidal thoughts you must seek medical attention immediately by calling 911 or calling your MD immediately  if symptoms less severe.  You Must read complete instructions/literature along with all the possible adverse reactions/side effects for all the Medicines you take and that have been prescribed to you. Take any new Medicines after you have completely understood and accept all the possible adverse reactions/side effects.   Please note  You were cared for by a hospitalist during your hospital stay. If you have any questions about your discharge medications or the care you received while you were in the hospital after you are discharged, you can call the unit and asked to speak with the hospitalist on call if the hospitalist that took care of you is not available. Once you are discharged, your primary care physician will handle any further medical issues. Please note that NO REFILLS for any discharge medications will be authorized once you are discharged, as it is  imperative that you return to your primary care physician (or establish a relationship with a primary care physician if you do not have one) for your aftercare needs so that they can reassess your need for medications and monitor your lab values.    Today   CHIEF COMPLAINT:   Chief Complaint  Patient presents with  . Chest Pain  . Urinary Frequency    VITAL SIGNS:  Blood pressure 167/70, pulse 64, temperature 97.7 F (36.5 C), temperature source Oral, resp. rate 18, height  (1.905 m), weight 71.442 kg (157 lb 8 oz), SpO2 99 %.  I/O:   Intake/Output Summary (Last 24 hours) at 09/05/15 0908 Last data filed at 09/05/15 4098  Gross per 24 hour  Intake    480 ml  Output   2875 ml  Net  -2395 ml    PHYSICAL EXAMINATION:   Physical Exam  GENERAL: 81 y.o.-year-old patient lying in the bed with no acute distress.  EYES: Pupils equal, round, reactive to light and  accommodation. No scleral icterus. Extraocular muscles intact.  HEENT: Head atraumatic, normocephalic. Oropharynx and nasopharynx clear.  NECK: Supple, no jugular venous distention. No thyroid enlargement, no tenderness.  LUNGS: Normal breath sounds bilaterally, no wheezing, rales,rhonchi or crepitation. No use of accessory muscles of respiration.  CARDIOVASCULAR: S1, S2 normal. No rubs, or gallops. 3/6 systolic murmur present ABDOMEN: Soft, nontender, nondistended. Bowel sounds present. No organomegaly or mass.  EXTREMITIES: No pedal edema, cyanosis, or clubbing.  NEUROLOGIC: Cranial nerves II through XII are intact. Muscle strength 5/5 in all extremities. Sensation intact except both feet where it is reduced. No feet erythema or tenderness on exam. Gait not checked.  PSYCHIATRIC: The patient is alert and oriented x 3.  SKIN: No obvious rash, lesion, or ulcer.   DATA REVIEW:   CBC  Recent Labs Lab 09/04/15 0643  WBC 9.3  HGB 10.2*  HCT 31.5*  PLT 118*    Chemistries   Recent Labs Lab  09/04/15 0643  NA 132*  K 4.1  CL 100*  CO2 25  GLUCOSE 180*  BUN 43*  CREATININE 1.72*  CALCIUM 8.1*    Cardiac Enzymes  Recent Labs Lab 09/02/15 1551  TROPONINI <0.03    Microbiology Results  Results for orders placed or performed during the hospital encounter of 09/02/15  Urine culture     Status: None   Collection Time: 09/02/15  5:11 PM  Result Value Ref Range Status   Specimen Description URINE, CLEAN CATCH  Final   Special Requests NONE  Final   Culture INSIGNIFICANT GROWTH  Final   Report Status 09/04/2015 FINAL  Final    RADIOLOGY:  No results found.  EKG:   Orders placed or performed during the hospital encounter of 09/02/15  . ED EKG within 10 minutes  . ED EKG within 10 minutes  . EKG 12-Lead  . EKG 12-Lead      Management plans discussed with the patient, family and they are in agreement.  CODE STATUS:     Code Status Orders        Start     Ordered   09/02/15 1857  Full code   Continuous     09/02/15 1858    Advance Directive Documentation        Most Recent Value   Type of Advance Directive  Healthcare Power of Attorney, Living will   Pre-existing out of facility DNR order (yellow form or pink MOST form)     "MOST" Form in Place?        TOTAL TIME TAKING CARE OF THIS PATIENT: 37 minutes.    Enid Baas M.D on 09/05/2015 at 9:08 AM  Between 7am to 6pm - Pager - [phone]  After 6pm go to [email or web address] - password [email or web address] [facility]  [place] [facility]  Office  [phone]  CC: Primary care physician; Tillman Abide, MD

## 2015-09-07 ENCOUNTER — Telehealth: Payer: Self-pay | Admitting: *Deleted

## 2015-09-07 NOTE — Telephone Encounter (Signed)
Transitional care call attempted.  Line was busy x3 attempts.  Will continue attempts to contact patient.

## 2015-09-08 NOTE — Telephone Encounter (Signed)
Transitional care call attempted.  Left message to return call. 

## 2015-09-09 NOTE — Telephone Encounter (Signed)
Transition Care Management Follow-up Telephone Call   Date discharged? 09/05/15   How have you been since you were released from the hospital? Improving   Do you understand why you were in the hospital? yes   Do you understand the discharge instructions? yes   Where were you discharged to? Home   Items Reviewed:  Medications reviewed: yes  Allergies reviewed: yes  Dietary changes reviewed: no  Referrals reviewed: no   Functional Questionnaire:   Activities of Daily Living (ADLs):   He states they are independent in the following: ambulation, feeding, continence, grooming, toileting and dressing States they require assistance with the following: bathing and hygiene   Any transportation issues/concerns?: no, will call for a ride   Any patient concerns? no   Confirmed importance and date/time of follow-up visits scheduled yes, 09/10/15 @ 1445  Provider Appointment booked with Tillman Abideichard Letvak, MD  Confirmed with patient if condition begins to worsen call PCP or go to the ER.  Patient was given the office number and encouraged to call back with question or concerns.  : yes /

## 2015-09-10 ENCOUNTER — Encounter: Payer: Self-pay | Admitting: Internal Medicine

## 2015-09-10 ENCOUNTER — Ambulatory Visit (INDEPENDENT_AMBULATORY_CARE_PROVIDER_SITE_OTHER): Payer: Medicare Other | Admitting: Internal Medicine

## 2015-09-10 VITALS — BP 140/70 | HR 58 | Temp 97.8°F | Wt 162.0 lb

## 2015-09-10 DIAGNOSIS — N401 Enlarged prostate with lower urinary tract symptoms: Secondary | ICD-10-CM

## 2015-09-10 DIAGNOSIS — N390 Urinary tract infection, site not specified: Secondary | ICD-10-CM

## 2015-09-10 MED ORDER — CEFUROXIME AXETIL 250 MG PO TABS: 250.0000 mg | ORAL_TABLET | Freq: Two times a day (BID) | ORAL | Status: DC

## 2015-09-10 NOTE — Progress Notes (Signed)
Subjective:    Patient ID: Evan [name], male    DOB: 1918/04/30, 81 y.o.   MRN: [id number]  HPI Here for hospital follow up Reviewed [facility] records and [facility] the day before  Had felt sick and had burning dysuria  Was mild and then worsened Given cipro but actually worsened the next morning Not sure about fever No shaking or sweats Admitted to [facility] with gradual improvement Rocephin changed to cephalexin on discharge 10/29  No nausea or vomiting No cough or significant SOB  Still has aide 5 days per week Working on getting more help for at night also Feels better but not back to normal  Current Outpatient Prescriptions on File Prior to Visit  Medication Sig Dispense Refill  . aspirin EC 81 MG tablet Take 81 mg by mouth daily.    . brimonidine-timolol (COMBIGAN) 0.2-0.5 % ophthalmic solution Place 1 drop into both eyes 2 (two) times daily.    . fluticasone (FLONASE) 50 MCG/ACT nasal spray Place 1 spray into both nostrils daily.    Marland Kitchen gabapentin (NEURONTIN) 100 MG capsule Take 1 capsule (100 mg total) by mouth daily after breakfast. 30 capsule 1  . gabapentin (NEURONTIN) 100 MG capsule Take 2 capsules (200 mg total) by mouth at bedtime. 30 capsule 1  . glipiZIDE (GLUCOTROL XL) 2.5 MG 24 hr tablet Take 2.5 mg by mouth daily.    . hydrALAZINE (APRESOLINE) 50 MG tablet Take 50 mg by mouth 4 (four) times daily.    . insulin lispro (HUMALOG) 100 UNIT/ML injection Inject into the skin 3 (three) times daily before meals. Pt uses per sliding scale.    Marland Kitchen lisinopril (PRINIVIL,ZESTRIL) 5 MG tablet Take 5 mg by mouth daily.    . nitroGLYCERIN (NITROSTAT) 0.4 MG SL tablet Place 0.4 mg under the tongue every 5 (five) minutes as needed for chest pain.     Marland Kitchen omeprazole (PRILOSEC) 40 MG capsule Take 40 mg by mouth daily.    . pravastatin (PRAVACHOL) 40 MG tablet Take 40 mg by mouth at bedtime.    . predniSONE (DELTASONE) 10 MG tablet Take 1 tablet (10 mg total) by mouth every other day.  100 tablet 1  . tamsulosin (FLOMAX) 0.4 MG CAPS capsule Take 0.4 mg by mouth daily.    . timolol (TIMOPTIC) 0.5 % ophthalmic solution Place 1 drop into both eyes daily.    . traMADol (ULTRAM) 50 MG tablet Take 1 tablet (50 mg total) by mouth 3 (three) times daily as needed. (Patient taking differently: Take 50 mg by mouth 3 (three) times daily as needed for moderate pain. ) 120 tablet 0  . verapamil (CALAN) 40 MG tablet Take 40 mg by mouth 2 (two) times daily.     No current facility-administered medications on file prior to visit.    Allergies  Allergen Reactions  . Penicillins Rash and Other (See Comments)    Has patient had a PCN reaction causing immediate rash, facial/tongue/throat swelling, SOB or lightheadedness with hypotension: No Has patient had a PCN reaction causing severe rash involving mucus membranes or skin necrosis: No Has patient had a PCN reaction that required hospitalization No Has patient had a PCN reaction occurring within the last 10 years: No If all of the above answers are "NO", then may proceed with Cephalosporin use.     Past Medical History  Diagnosis Date  . CAD (coronary artery disease)   . Diabetes mellitus   . GERD (gastroesophageal reflux disease)   .  Hyperlipidemia   . Arthritis   . Hypertension   . BPH (benign prostatic hypertrophy)   . Degenerative disc disease   . Polymyalgia rheumatica (HCC)   . CVA (cerebral vascular accident) (HCC)   . Gastroenteritis   . Chronic kidney disease, stage III (moderate)     Past Surgical History  Procedure Laterality Date  . Cataract extraction  1999 1992    glaucoma  . Angioplasty    . Esophageal dilation    . [place] echocardiography  04/2004    benign  . Esophagogastroduodenoscopy N/A 04/09/2015    Procedure: ESOPHAGOGASTRODUODENOSCOPY (EGD);  Surgeon: Elnita MaxwellMatthew Gordon Rein, MD;  Location: [facility] ENDOSCOPY;  Service: Endoscopy;  Laterality: N/A;  . Savory dilation N/A 04/09/2015    Procedure: SAVORY DILATION;   Surgeon: Elnita MaxwellMatthew Gordon Rein, MD;  Location[facility] END[facility]COPY;  Service: Endoscopy;  Laterality: N/A;    No family history on file.  Social History   Social History  . Marital Status: Married    Spouse Name: N/A  . Number of Children: N/A  . Years of Education: N/A   Occupational History  . retired    Social History Main Topics  . Smoking status: Former Games developermoker  . Smokeless tobacco: Never Used     Comment: quit over 20 years ago  . Alcohol Use: No  . Drug Use: No  . Sexual Activity: Not on file   Other Topics Concern  . Not on file   Social History Narrative   Religion affecting care--involved with Church throughout life (deacon, etc)      Has living will    Requests son Karren BurlyDwight  as his health care power of attorney   Requests DNR--- done 10/15/14   No tube feeds if cognitively unaware            Review of Systems Had bad right foot pain-- would radiate up leg Appetite is good No diarrhea    Objective:   Physical Exam  Constitutional:  Frail, in wheelchair  Neck: Normal range of motion. Neck supple. No thyromegaly present.  Cardiovascular: Normal rate, regular rhythm and normal heart sounds.  Exam reveals no gallop.   No murmur heard. Pulmonary/Chest: Effort normal and breath sounds normal. No respiratory distress. He has no wheezes. He has no rales.  Abdominal: Soft. He exhibits no distension. There is no tenderness. There is no rebound and no guarding.  Slight lower abdominal sensitivity  Musculoskeletal:  No CVA tenderness  Lymphadenopathy:    He has no cervical adenopathy.  Psychiatric: He has a normal mood and affect.          Assessment & Plan:

## 2015-09-10 NOTE — Assessment & Plan Note (Signed)
Classic burning dysuria and got appropriate antibiotic for his infection (cipro) but still worsened with systemic symptoms and need for hospital Has BPH but he feels he empties reasonably--not sure he has any chronic retention Urine culture negative after Rx at [facility] but positive for Providencia rettgeri at the urgent care( resistant to several antibiotics including cephalexin) Will finish out antibiotic with cefuroxime (sensitive) and leave him some for early Rx if symptoms recur.

## 2015-09-10 NOTE — Patient Instructions (Signed)
Please stop the cephalexin. Instead, start cefuroxime (prescription at [facility]) and take twice a day for 5 days. Keep the remainder of the prescription and start immediately if you notice urine burning again (and then call me to let me know)

## 2015-09-10 NOTE — Progress Notes (Signed)
Pre visit review using our clinic review tool, if applicable. No additional management support is needed unless otherwise documented below in the visit note. 

## 2015-09-11 ENCOUNTER — Other Ambulatory Visit: Payer: Self-pay | Admitting: Internal Medicine

## 2015-09-11 NOTE — Telephone Encounter (Signed)
Last f/u 04/2015 

## 2015-09-11 NOTE — Telephone Encounter (Signed)
Approved: okay for 1 year 

## 2015-09-22 DIAGNOSIS — N183 Chronic kidney disease, stage 3 (moderate): Secondary | ICD-10-CM | POA: Diagnosis not present

## 2015-09-22 DIAGNOSIS — N390 Urinary tract infection, site not specified: Secondary | ICD-10-CM | POA: Diagnosis not present

## 2015-09-22 DIAGNOSIS — E1122 Type 2 diabetes mellitus with diabetic chronic kidney disease: Secondary | ICD-10-CM | POA: Diagnosis not present

## 2015-09-22 DIAGNOSIS — I129 Hypertensive chronic kidney disease with stage 1 through stage 4 chronic kidney disease, or unspecified chronic kidney disease: Secondary | ICD-10-CM | POA: Diagnosis not present

## 2015-10-15 ENCOUNTER — Encounter: Payer: Self-pay | Admitting: Internal Medicine

## 2015-10-15 ENCOUNTER — Ambulatory Visit (INDEPENDENT_AMBULATORY_CARE_PROVIDER_SITE_OTHER): Payer: Medicare Other | Admitting: Internal Medicine

## 2015-10-15 VITALS — BP 142/68 | HR 63 | Temp 98.6°F | Wt 165.0 lb

## 2015-10-15 DIAGNOSIS — N183 Chronic kidney disease, stage 3 unspecified: Secondary | ICD-10-CM

## 2015-10-15 DIAGNOSIS — E1122 Type 2 diabetes mellitus with diabetic chronic kidney disease: Secondary | ICD-10-CM | POA: Diagnosis not present

## 2015-10-15 DIAGNOSIS — E1140 Type 2 diabetes mellitus with diabetic neuropathy, unspecified: Secondary | ICD-10-CM

## 2015-10-15 DIAGNOSIS — I10 Essential (primary) hypertension: Secondary | ICD-10-CM | POA: Diagnosis not present

## 2015-10-15 DIAGNOSIS — R1310 Dysphagia, unspecified: Secondary | ICD-10-CM | POA: Insufficient documentation

## 2015-10-15 DIAGNOSIS — M353 Polymyalgia rheumatica: Secondary | ICD-10-CM

## 2015-10-15 NOTE — Progress Notes (Signed)
Pre visit review using our clinic review tool, if applicable. No additional management support is needed unless otherwise documented below in the visit note. 

## 2015-10-15 NOTE — Assessment & Plan Note (Signed)
Creatinine remarkably stable between 1.6-2 for over 3 years On ACEI No furosemide

## 2015-10-15 NOTE — Progress Notes (Signed)
Subjective:    Patient ID: Evan [name], Evan [name]    DOB: [birth year], 81 y.o.   MRN: [id number]  HPI Here with friend Evan [name]  Doing "fair" Will occasionally fix his breakfast---- has 2 hours per day of [facility] for meals, etc She does laundry, heavy cleaning He can shower with stand by assist Dresses and uses bathroom himself  Saw geriatric nephrology at [facility] Dr Lenard Galloway   [phone]. Fax [phone] Wonders about fursemide--he isn't on this  Breathing is pretty good No chest pain Does have some mucus that hands in his throat---had swallowing evaluation but no new recommendations at this time.  Does have food get caught at times--like baked [place] or beef No choking or problems with liquids No palpitations  No shoulder or hip aching since back on prednisone Energy level is not great---but no change  Voiding okay Still quite frequent  Sugars are running okay [name] a little around Thanksgiving 114-high 100's Hasn't needed emergency insulin No foot sores. Pain better with gabapentin  Current Outpatient Prescriptions on File Prior to Visit  Medication Sig Dispense Refill  . aspirin EC 81 MG tablet Take 81 mg by mouth daily.    . brimonidine-timolol (COMBIGAN) 0.2-0.5 % ophthalmic solution Place 1 drop into both eyes 2 (two) times daily.    . fluticasone (FLONASE) 50 MCG/ACT nasal spray Place 1 spray into both nostrils daily.    Marland Kitchen gabapentin (NEURONTIN) 100 MG capsule Take 1 capsule (100 mg total) by mouth daily after breakfast. 30 capsule 1  . gabapentin (NEURONTIN) 100 MG capsule Take 2 capsules (200 mg total) by mouth at bedtime. 30 capsule 1  . glipiZIDE (GLUCOTROL XL) 2.5 MG 24 hr tablet Take 2.5 mg by mouth daily.    . hydrALAZINE (APRESOLINE) 50 MG tablet Take 50 mg by mouth 4 (four) times daily.    . insulin lispro (HUMALOG) 100 UNIT/ML injection Inject into the skin 3 (three) times daily before meals. Pt uses per sliding scale.    Marland Kitchen lisinopril  (PRINIVIL,ZESTRIL) 5 MG tablet Take 5 mg by mouth daily.    . nitroGLYCERIN (NITROSTAT) 0.4 MG SL tablet Place 0.4 mg under the tongue every 5 (five) minutes as needed for chest pain.     Marland Kitchen omeprazole (PRILOSEC) 40 MG capsule Take 40 mg by mouth daily.    . pravastatin (PRAVACHOL) 40 MG tablet Take 40 mg by mouth at bedtime.    . predniSONE (DELTASONE) 10 MG tablet Take 1 tablet (10 mg total) by mouth every other day. 100 tablet 1  . tamsulosin (FLOMAX) 0.4 MG CAPS capsule Take 0.4 mg by mouth daily.    . timolol (TIMOPTIC) 0.5 % ophthalmic solution Place 1 drop into both eyes daily.    . traMADol (ULTRAM) 50 MG tablet Take 1 tablet (50 mg total) by mouth 3 (three) times daily as needed. (Patient taking differently: Take 50 mg by mouth 3 (three) times daily as needed for moderate pain. ) 120 tablet 0  . verapamil (CALAN) 40 MG tablet TAKE 1 TABLET TWICE A DAY 180 tablet 3   No current facility-administered medications on file prior to visit.    Allergies  Allergen Reactions  . Penicillins Rash and Other (See Comments)    Has patient had a PCN reaction causing immediate rash, facial/tongue/throat swelling, SOB or lightheadedness with hypotension: No Has patient had a PCN reaction causing severe rash involving mucus membranes or skin necrosis: No Has patient had a PCN  reaction that required hospitalization No Has patient had a PCN reaction occurring within the last 10 years: No If all of the above answers are "NO", then may proceed with Cephalosporin use.     Past Medical History  Diagnosis Date  . CAD (coronary artery disease)   . Diabetes mellitus   . GERD (gastroesophageal reflux disease)   . Hyperlipidemia   . Arthritis   . Hypertension   . BPH (benign prostatic hypertrophy)   . Degenerative disc disease   . Polymyalgia rheumatica (HCC)   . CVA (cerebral vascular accident) (HCC)   . Gastroenteritis   . Chronic kidney disease, stage III (moderate)     Past Surgical History    Procedure Laterality Date  . Cataract extraction  1999 1992    glaucoma  . Angioplasty    . Esophageal dilation    . [place] echocardiography  04/2004    benign  . Esophagogastroduodenoscopy N/A 04/09/2015    Procedure: ESOPHAGOGASTRODUODENOSCOPY (EGD);  Surgeon: Elnita MaxwellMatthew Gordon Rein, MD;  Location[facility] ENDOSCOPY;  Service: Endoscopy;  Laterality: N/A;  . Savory dilation N/A 04/09/2015    Procedure: SAVORY DILATION;  Surgeon: Elnita MaxwellMatthew Gordon Rein, MD;  Location[facility] ENDOSCOPY;  Service: Endoscopy;  Laterality: N/A;    No family history on file.  Social History   Social History  . Marital Status: Married    Spouse Name: N/A  . Number of Children: N/A  . Years of Education: N/A   Occupational History  . retired    Social History Main Topics  . Smoking status: Former Games developermoker  . Smokeless tobacco: Never Used     Comment: quit over 20 years ago  . Alcohol Use: No  . Drug Use: No  . Sexual Activity: Not on file   Other Topics Concern  . Not on file   Social History Narrative   Religion affecting care--involved with Church throughout life (deacon, etc)      Has living will    Requests son Karren BurlyDwight  as his health care power of attorney   Requests DNR--- done 10/15/14   No tube feeds if cognitively unaware            Review of Systems  Occasional knee pain or leg soreness--pain not a big issue     Objective:   Physical Exam  Constitutional: No distress.  Neck: Normal range of motion. No thyromegaly present.  Cardiovascular: Normal rate, regular rhythm and normal heart sounds.  Exam reveals no gallop.   No murmur heard. Faint distal pulses  Pulmonary/Chest: Effort normal. No respiratory distress. He has no wheezes.  Slight medial RLL crackles  Abdominal: Soft. There is no tenderness.  Musculoskeletal: He exhibits no edema or tenderness.  Lymphadenopathy:    He has no cervical adenopathy.  Neurological:  Basically no sensation in his feet  Skin: No rash noted. No  erythema.  No foot lesions          Assessment & Plan:

## 2015-10-15 NOTE — Assessment & Plan Note (Signed)
I don't recommend modified BSS He just needs soft meats and be careful with bread

## 2015-10-15 NOTE — Assessment & Plan Note (Signed)
BP Readings from Last 3 Encounters:  10/15/15 142/68  09/10/15 140/70  09/05/15 167/70   Wouldn't increase his meds at his age Was 160/90--then down after sitting when I rechecked it

## 2015-10-15 NOTE — Assessment & Plan Note (Signed)
Controlled with the low dose prednisone

## 2015-10-15 NOTE — Assessment & Plan Note (Signed)
Seems to still have good control Gabapentin helping pain in feet

## 2015-10-16 LAB — HEMOGLOBIN A1C: HEMOGLOBIN A1C: 7.4 % — AB (ref 4.6–6.5)

## 2015-10-19 ENCOUNTER — Other Ambulatory Visit: Payer: Self-pay | Admitting: *Deleted

## 2015-10-19 MED ORDER — GLIPIZIDE ER 2.5 MG PO TB24: 2.5000 mg | ORAL_TABLET | Freq: Every day | ORAL | Status: DC

## 2015-10-19 NOTE — Telephone Encounter (Signed)
Received faxed refill request from pharmacy. Refill sent to pharmacy electronically. 

## 2015-10-22 ENCOUNTER — Encounter: Payer: Self-pay | Admitting: *Deleted

## 2015-11-02 ENCOUNTER — Other Ambulatory Visit: Payer: Self-pay | Admitting: Internal Medicine

## 2015-11-04 ENCOUNTER — Other Ambulatory Visit: Payer: Self-pay | Admitting: *Deleted

## 2015-11-04 MED ORDER — TAMSULOSIN HCL 0.4 MG PO CAPS: 0.4000 mg | ORAL_CAPSULE | Freq: Every day | ORAL | Status: DC

## 2015-11-15 ENCOUNTER — Other Ambulatory Visit: Payer: Self-pay | Admitting: Internal Medicine

## 2015-11-16 MED ORDER — HYDRALAZINE HCL 50 MG PO TABS: 50.0000 mg | ORAL_TABLET | Freq: Four times a day (QID) | ORAL | Status: DC

## 2015-11-16 NOTE — Addendum Note (Signed)
Addended by: Sueanne MargaritaSMITH, Shemia Bevel L on: 11/16/2015 01:05 PM   Modules accepted: Orders

## 2015-11-25 IMAGING — CR DG ABDOMEN 1V
1 series · 2 of 2 positions shown · non-contrast
Comparison: 07/28/2012

CLINICAL DATA: No BM in 3 days

EXAM:
ABDOMEN - 1 VIEW

[Series 1: supine kub · 0.17mm/px · 2 of 2 slices shown]
[im 1/2]
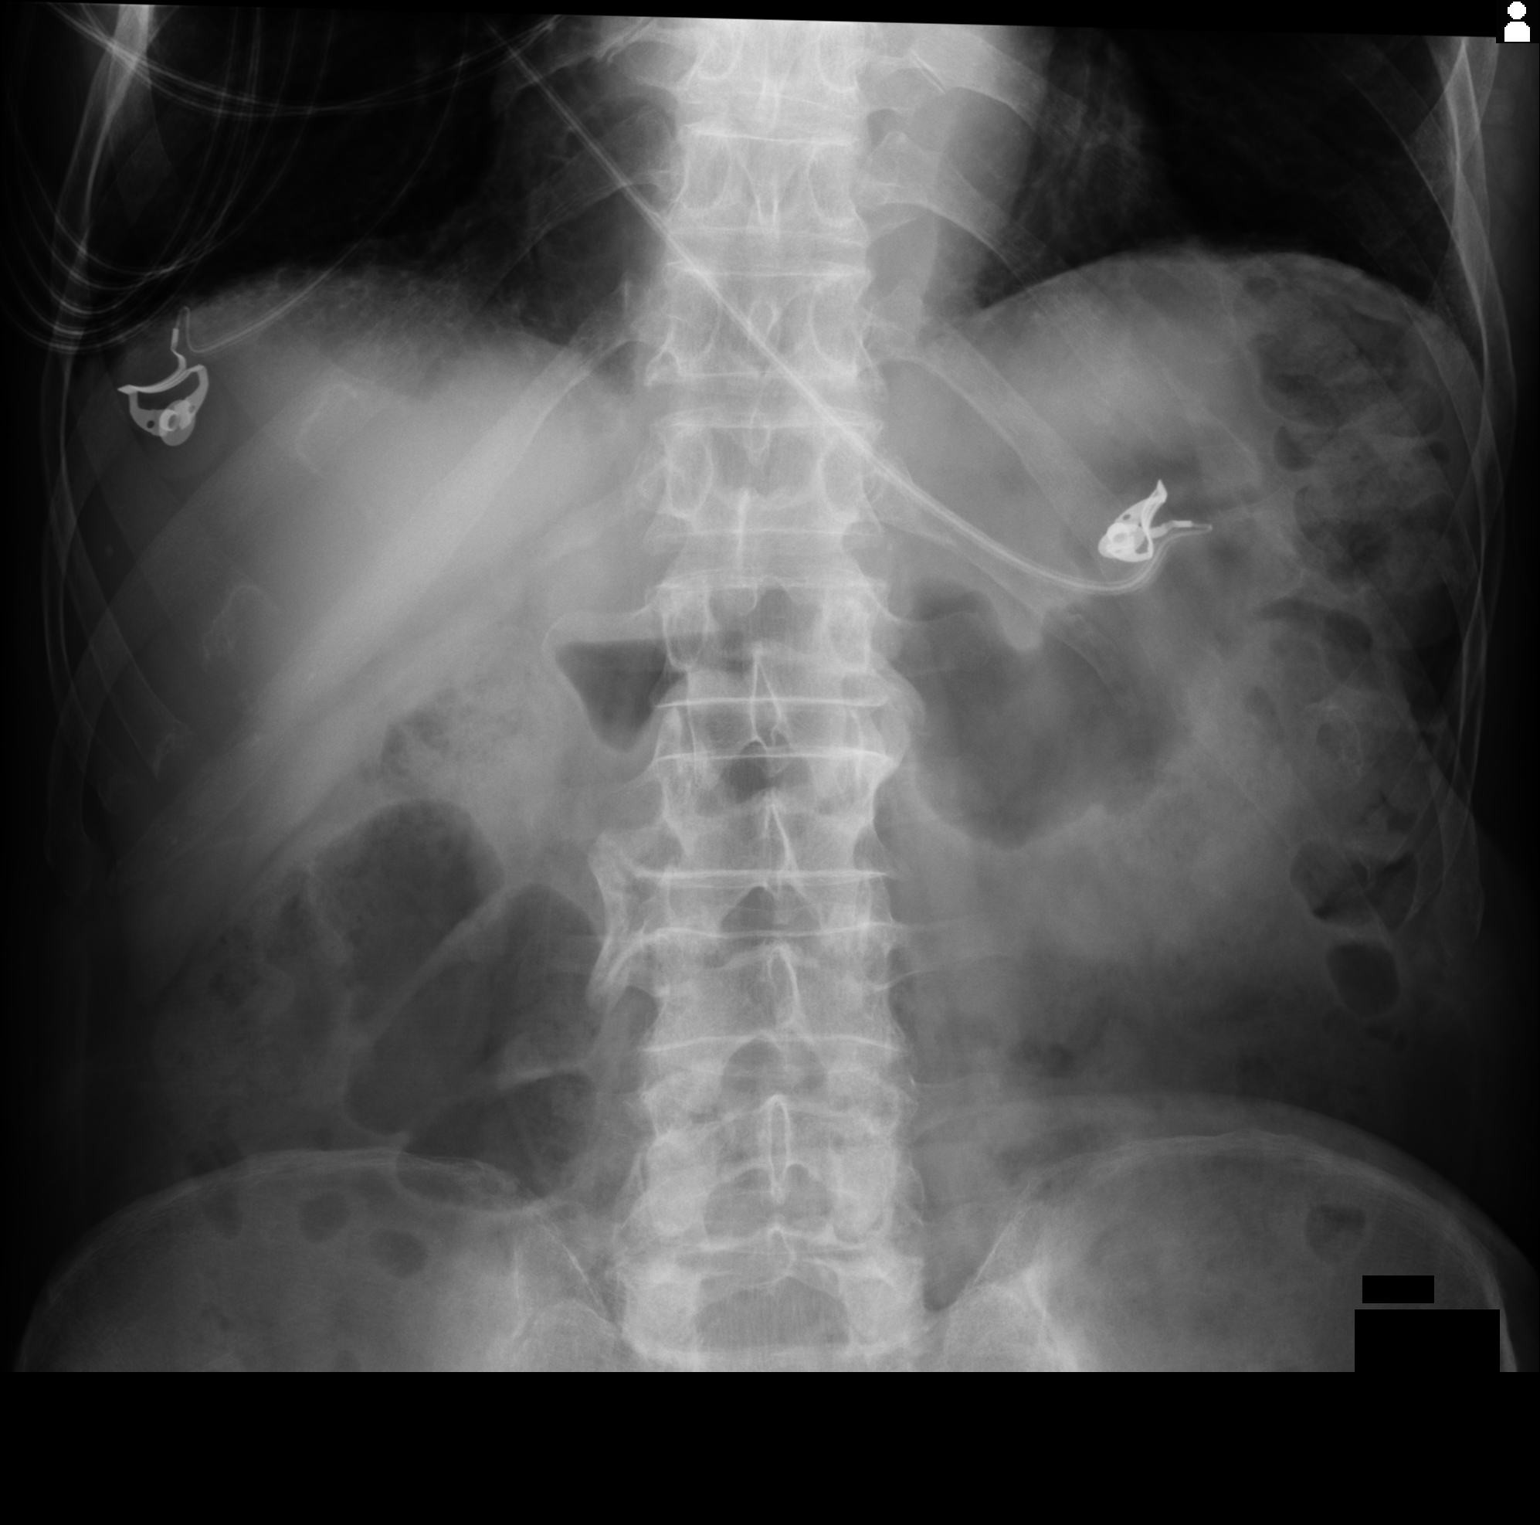
[im 2/2]
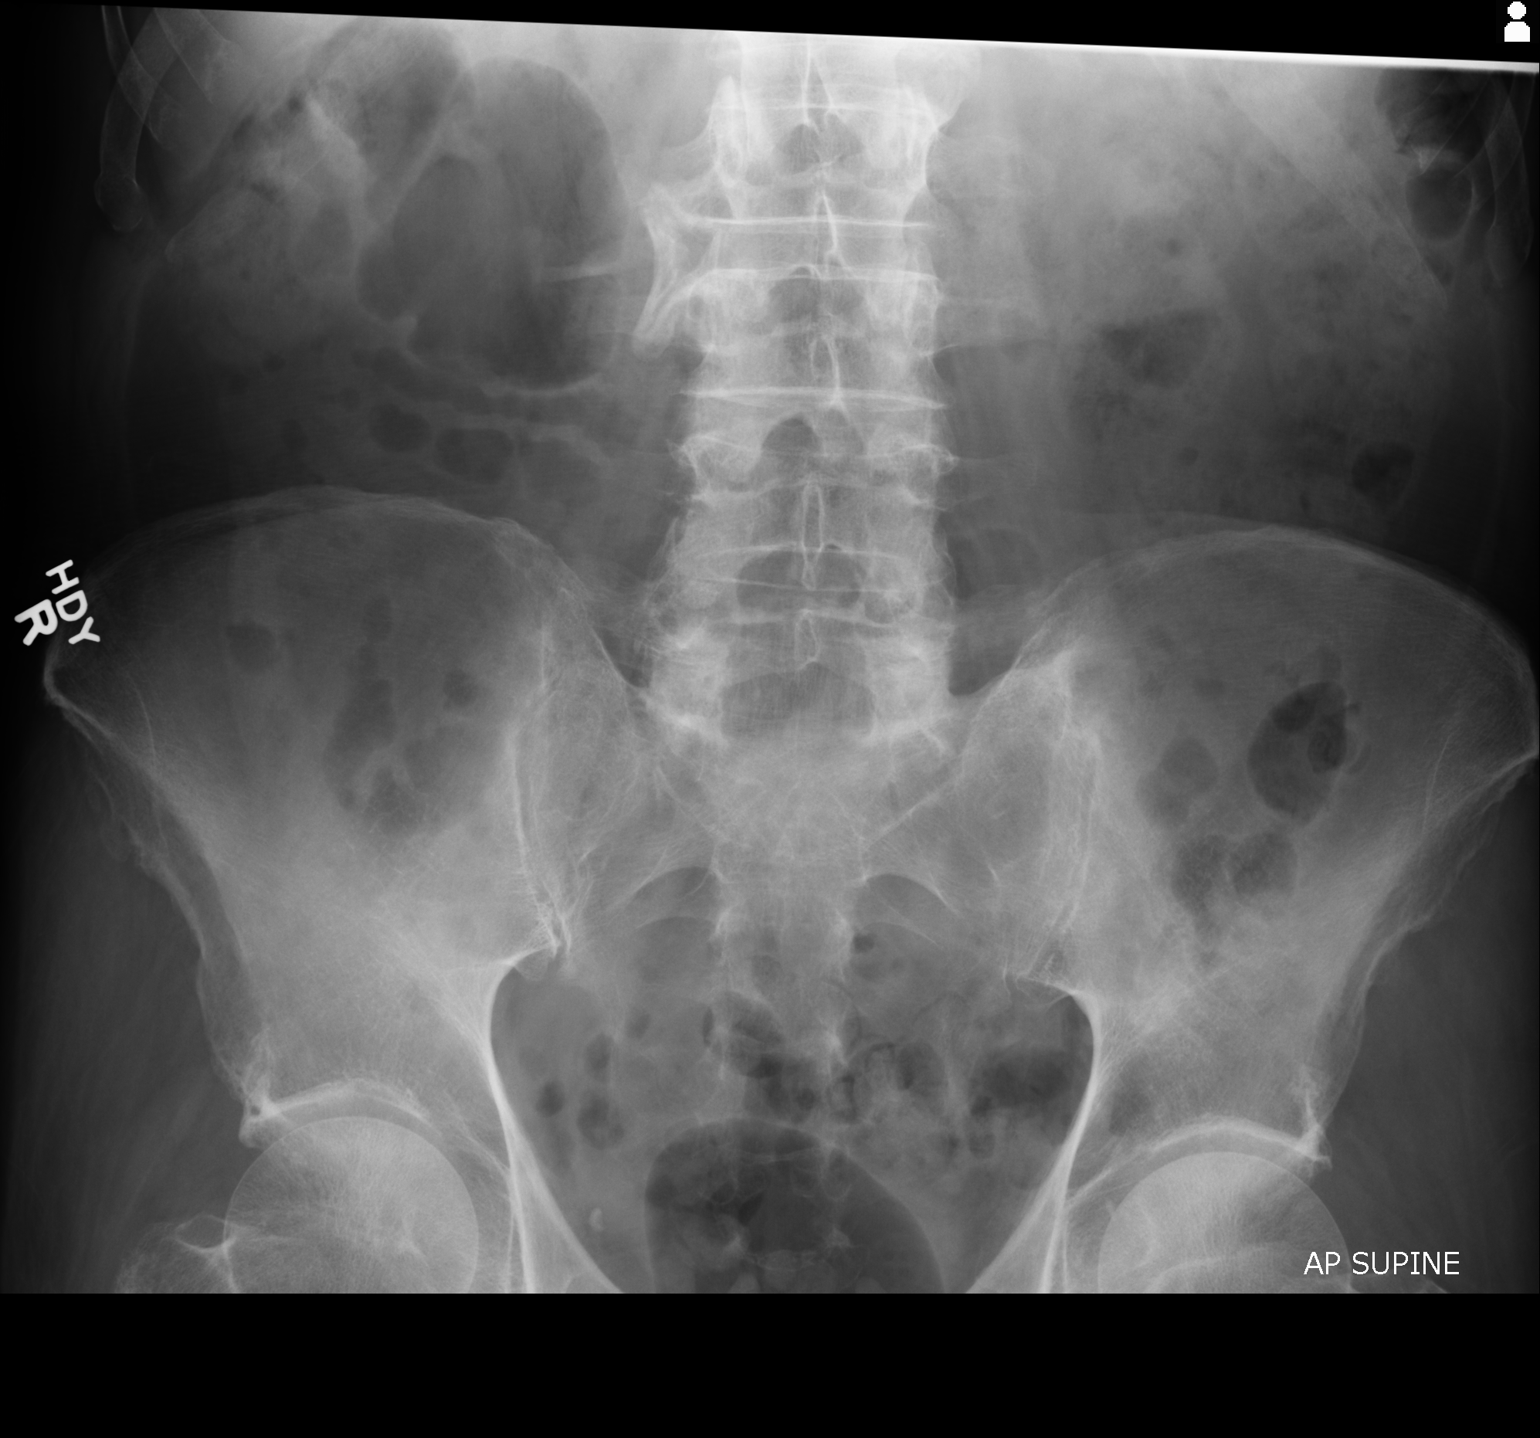

[2 of 2 positions shown; findings below may reference images not displayed]

FINDINGS: No free air. Small bowel decompressed. Fecal load decreased since
previous exam. Spurring in the mid lumbar spine. Right pelvic
phlebolith.
IMPRESSION: 1. Negative.

## 2015-11-25 IMAGING — CT CT ABD-PELV W/ CM
2 of 5 series · 16 of 46 positions shown, 18 images · IV contrast (agent unspecified)
Comparison: [DATE]

CLINICAL DATA: Difficulty having bowel movement, difficulty
urinating for 3 days, bilateral lower extremity swelling and also
dizziness

EXAM:
CT ABDOMEN AND PELVIS WITH CONTRAST
TECHNIQUE: Multidetector CT imaging of the abdomen and pelvis was performed
using the standard protocol following bolus administration of
intravenous contrast.
CONTRAST:  75 mL [id number]

[Series 2: routine abd pel with · axial · 0.70mm/px · z∈[-480,-50]mm · 13 of 98 slices shown, 15 images]
[im 6/98  soft-tissue]
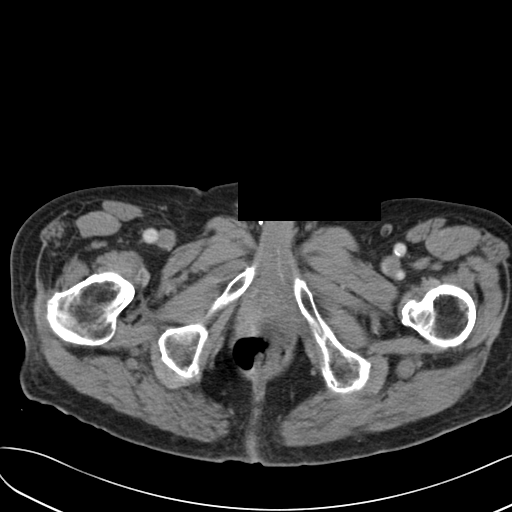
[im 6/98  bone]
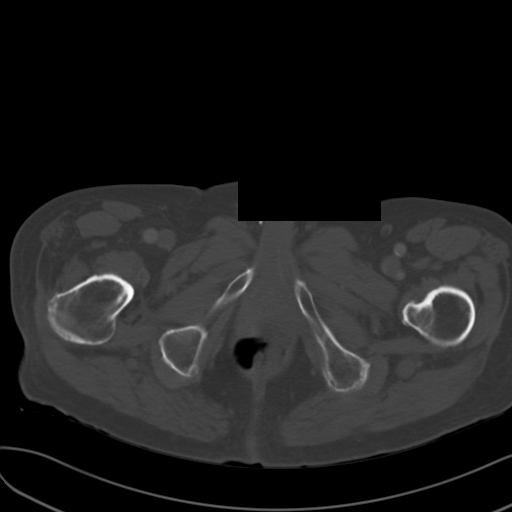
[im 16/98  soft-tissue]
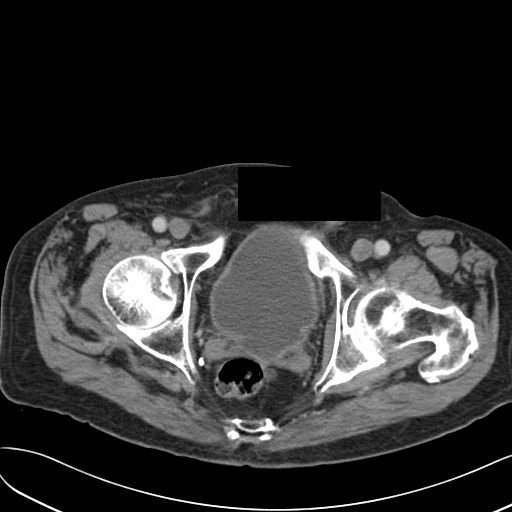
[im 21/98  soft-tissue]
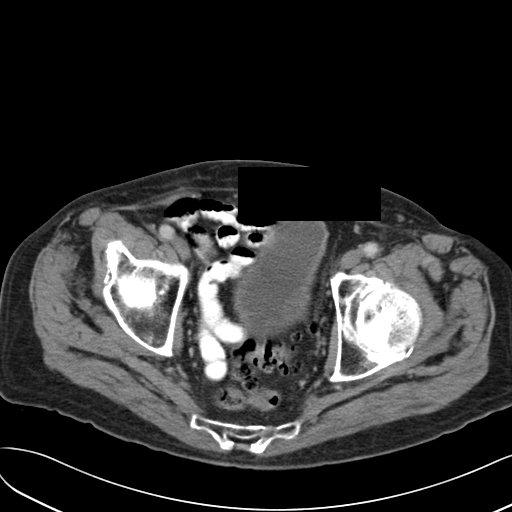
[im 26/98  soft-tissue]
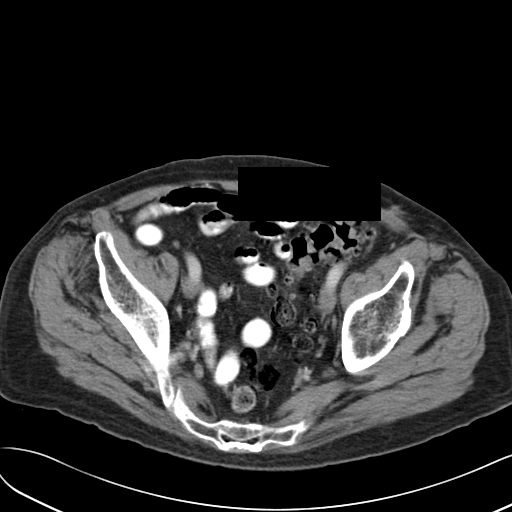
[im 36/98  soft-tissue]
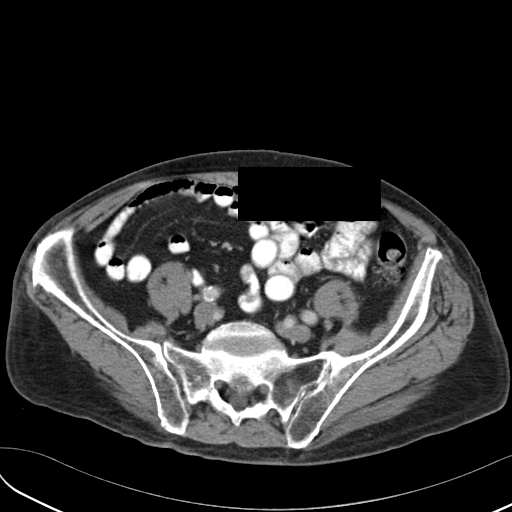
[im 41/98  soft-tissue]
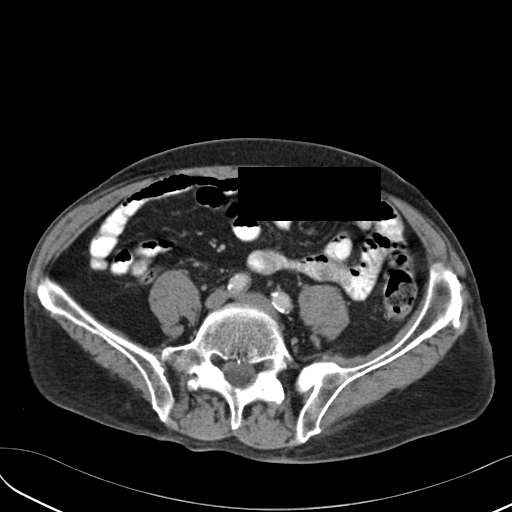
[im 52/98  soft-tissue]
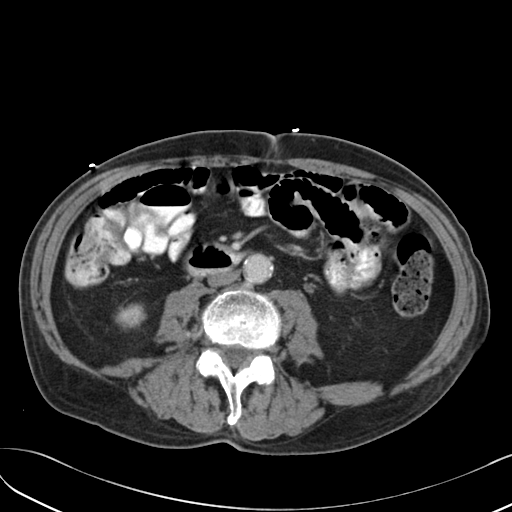
[im 57/98  soft-tissue]
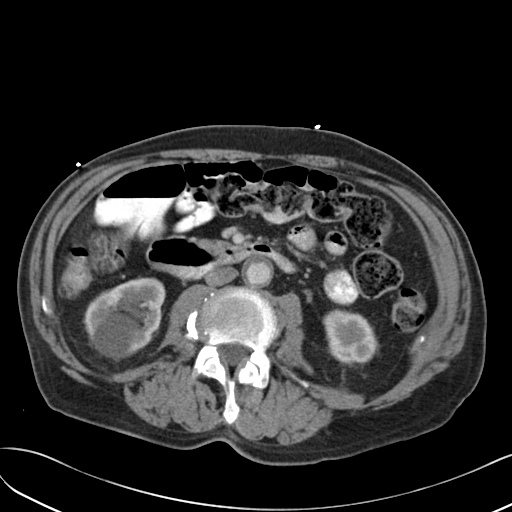
[im 62/98  soft-tissue]
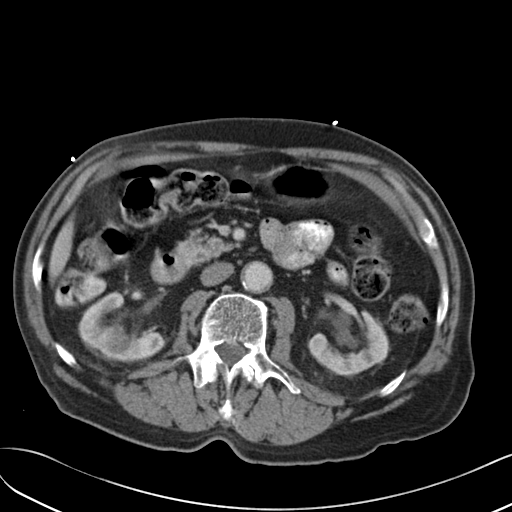
[im 62/98  bone]
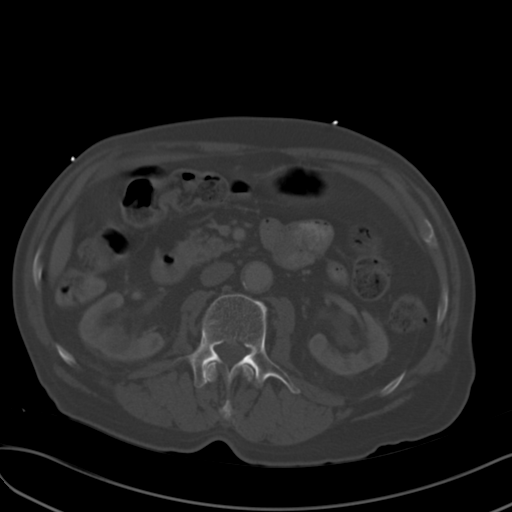
[im 72/98  soft-tissue]
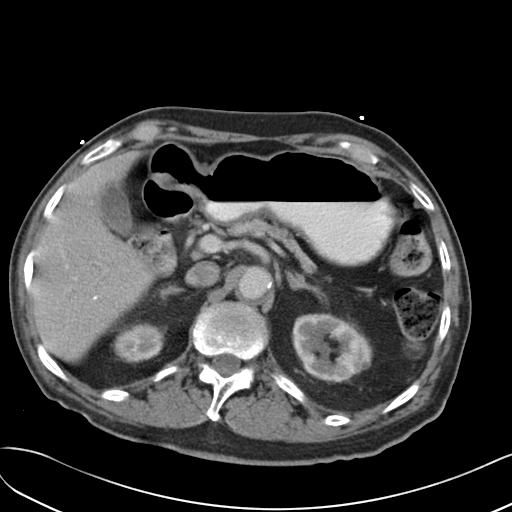
[im 77/98  soft-tissue]
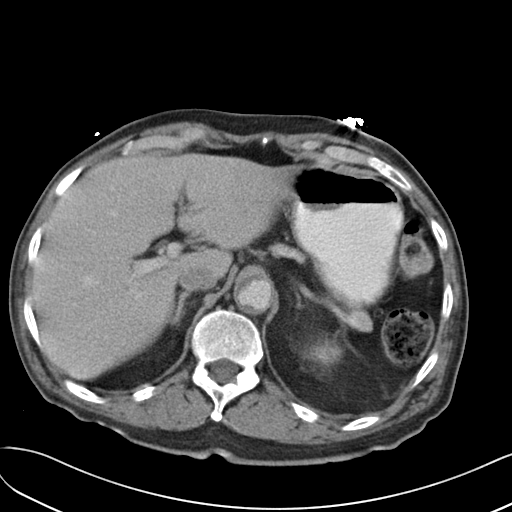
[im 82/98  soft-tissue]
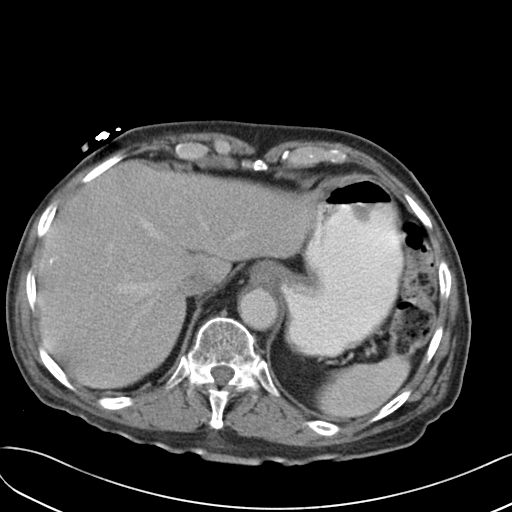
[im 92/98  soft-tissue]
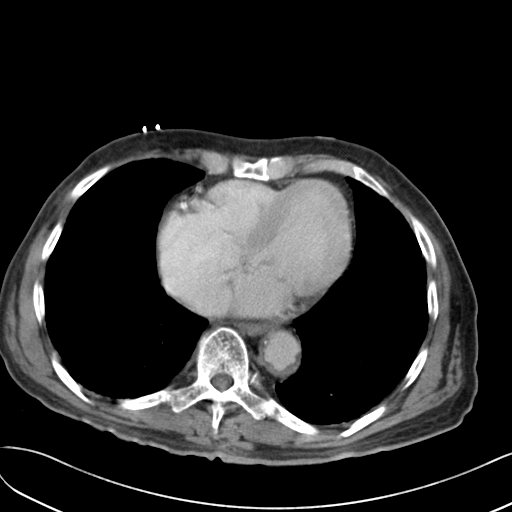

[Series 5: cor routine abd pel with · coronal · 0.65mm/px · 3 of 125 slices shown]
[im 42/125  soft-tissue]
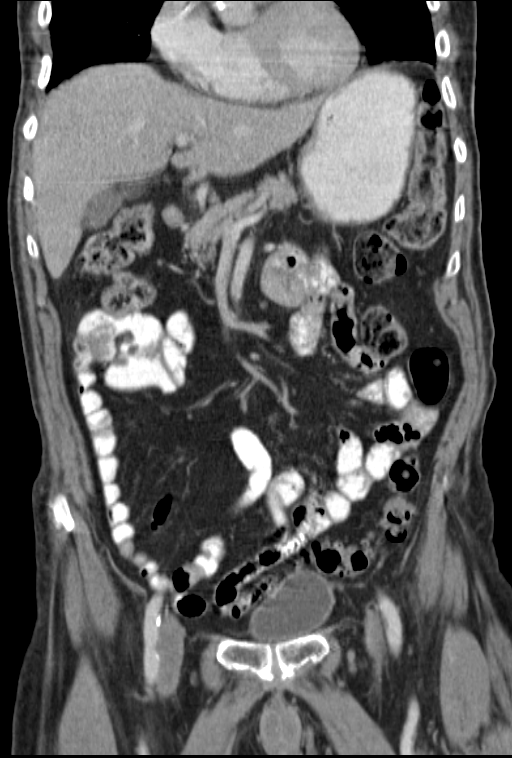
[im 56/125  soft-tissue]
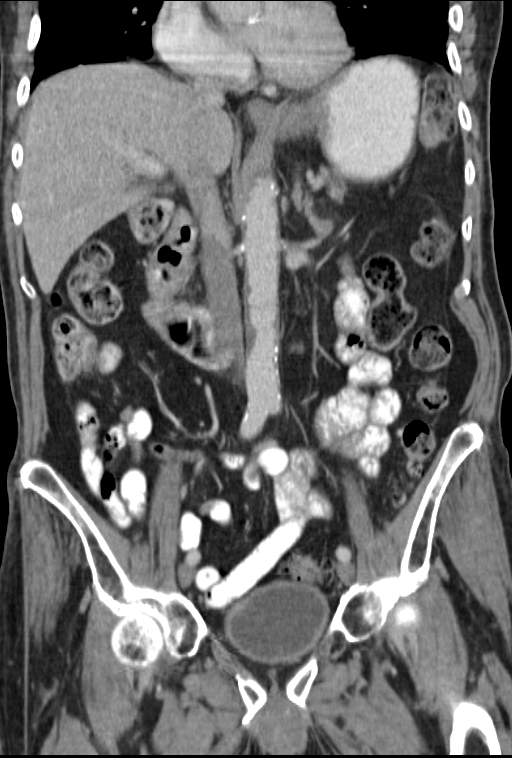
[im 69/125  soft-tissue]
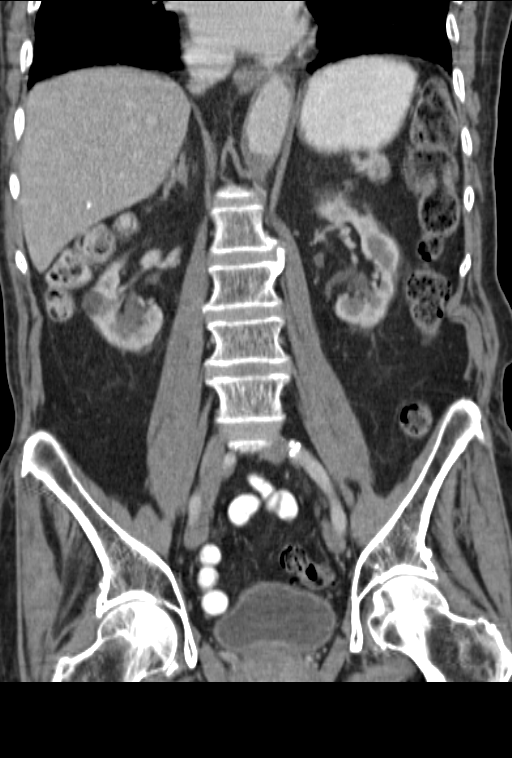

[16 of 46 positions shown; findings below may reference images not displayed]

FINDINGS: 3 cm low-attenuation lesion in the spleen, stable. Liver and
gallbladder normal. Pancreas normal. Adrenal glands normal.

Extensive atherosclerotic plaque involves the abdominal aorta.

Numerous bilateral parenchymal and peripelvic renal cysts, stable.
Duplicated left-sided collecting system.

Nonobstructive bowel gas pattern. Severe diverticulosis of the
sigmoid colon, with no evidence of diverticulitis.

Bladder and reproductive organs normal. No ascites. No significant
adenopathy. No acute musculoskeletal findings.

Visualized portions of the lung bases show mild chronic appearing
interstitial change, not significantly different from prior study.
IMPRESSION: No acute abnormalities. Severe sigmoid colon diverticulosis without
evidence of diverticulitis.

## 2015-12-21 ENCOUNTER — Other Ambulatory Visit: Payer: Self-pay | Admitting: Internal Medicine

## 2016-01-07 ENCOUNTER — Other Ambulatory Visit: Payer: Self-pay | Admitting: Internal Medicine

## 2016-01-13 ENCOUNTER — Other Ambulatory Visit: Payer: Self-pay | Admitting: Internal Medicine

## 2016-01-13 NOTE — Telephone Encounter (Signed)
Approved: #100 x 1 Check with him--he used to take it only every other day--find out if he is back to daily

## 2016-01-13 NOTE — Telephone Encounter (Signed)
Last rx was written 05-01-15 #100/1. Last OV 10-15-15. Next OV 02-15-16

## 2016-01-13 NOTE — Telephone Encounter (Signed)
Spoke to pt. He states he is taking it every other day. I have sent the rx to the pharmacy as requested

## 2016-01-14 ENCOUNTER — Telehealth: Payer: Self-pay

## 2016-01-14 MED ORDER — GLUCOSE BLOOD VI STRP: ORAL_STRIP | Status: AC

## 2016-01-14 MED ORDER — ONETOUCH DELICA LANCETS 33G MISC: 1.0000 | Freq: Three times a day (TID) | Status: AC

## 2016-01-14 MED ORDER — ONETOUCH ULTRA 2 W/DEVICE KIT: 1.0000 | PACK | Freq: Once | Status: AC

## 2016-01-14 NOTE — Telephone Encounter (Signed)
Letter from [facility] states they will not cover Accu-Chek Aviva but they will cover One Touch Ultra. Patient states he is fine with changing to the One Touch. [facility]

## 2016-01-14 NOTE — Telephone Encounter (Signed)
New One Touch Ultra Kit, Lancets, and Strips sent to pharmacy

## 2016-01-27 ENCOUNTER — Other Ambulatory Visit: Payer: Self-pay | Admitting: Internal Medicine

## 2016-01-28 NOTE — Telephone Encounter (Signed)
Called in rx to [facility] at pharmacy

## 2016-01-28 NOTE — Telephone Encounter (Signed)
Last refill 08-21-15 #30/0 Last OV 10-15-15 Next OV 02-15-16

## 2016-01-28 NOTE — Telephone Encounter (Signed)
Approved: 30 x 0 

## 2016-02-04 ENCOUNTER — Emergency Department: Payer: Medicare Other

## 2016-02-04 ENCOUNTER — Encounter: Payer: Self-pay | Admitting: Emergency Medicine

## 2016-02-04 ENCOUNTER — Inpatient Hospital Stay
Admission: EM | Admit: 2016-02-04 | Discharge: 2016-02-06 | DRG: 192 | Disposition: A | Discharge status: Home / Self Care | Payer: Medicare Other | Attending: Internal Medicine | Admitting: Internal Medicine

## 2016-02-04 DIAGNOSIS — M7989 Other specified soft tissue disorders: Secondary | ICD-10-CM | POA: Diagnosis present

## 2016-02-04 DIAGNOSIS — K219 Gastro-esophageal reflux disease without esophagitis: Secondary | ICD-10-CM | POA: Diagnosis present

## 2016-02-04 DIAGNOSIS — E1140 Type 2 diabetes mellitus with diabetic neuropathy, unspecified: Secondary | ICD-10-CM | POA: Diagnosis present

## 2016-02-04 DIAGNOSIS — E785 Hyperlipidemia, unspecified: Secondary | ICD-10-CM | POA: Diagnosis present

## 2016-02-04 DIAGNOSIS — M1990 Unspecified osteoarthritis, unspecified site: Secondary | ICD-10-CM | POA: Diagnosis present

## 2016-02-04 DIAGNOSIS — Z880 Allergy status to penicillin: Secondary | ICD-10-CM

## 2016-02-04 DIAGNOSIS — M353 Polymyalgia rheumatica: Secondary | ICD-10-CM | POA: Diagnosis present

## 2016-02-04 DIAGNOSIS — M5136 Other intervertebral disc degeneration, lumbar region: Secondary | ICD-10-CM | POA: Diagnosis present

## 2016-02-04 DIAGNOSIS — Z7982 Long term (current) use of aspirin: Secondary | ICD-10-CM

## 2016-02-04 DIAGNOSIS — J449 Chronic obstructive pulmonary disease, unspecified: Secondary | ICD-10-CM | POA: Diagnosis present

## 2016-02-04 DIAGNOSIS — Z7952 Long term (current) use of systemic steroids: Secondary | ICD-10-CM

## 2016-02-04 DIAGNOSIS — E1122 Type 2 diabetes mellitus with diabetic chronic kidney disease: Secondary | ICD-10-CM | POA: Diagnosis present

## 2016-02-04 DIAGNOSIS — N183 Chronic kidney disease, stage 3 (moderate): Secondary | ICD-10-CM | POA: Diagnosis present

## 2016-02-04 DIAGNOSIS — K5909 Other constipation: Secondary | ICD-10-CM | POA: Diagnosis present

## 2016-02-04 DIAGNOSIS — N400 Enlarged prostate without lower urinary tract symptoms: Secondary | ICD-10-CM | POA: Diagnosis present

## 2016-02-04 DIAGNOSIS — I10 Essential (primary) hypertension: Secondary | ICD-10-CM | POA: Diagnosis present

## 2016-02-04 DIAGNOSIS — R0602 Shortness of breath: Secondary | ICD-10-CM

## 2016-02-04 DIAGNOSIS — R490 Dysphonia: Secondary | ICD-10-CM | POA: Diagnosis present

## 2016-02-04 DIAGNOSIS — Z87891 Personal history of nicotine dependence: Secondary | ICD-10-CM

## 2016-02-04 DIAGNOSIS — Z79899 Other long term (current) drug therapy: Secondary | ICD-10-CM

## 2016-02-04 DIAGNOSIS — J440 Chronic obstructive pulmonary disease with acute lower respiratory infection: Principal | ICD-10-CM | POA: Diagnosis present

## 2016-02-04 DIAGNOSIS — Z794 Long term (current) use of insulin: Secondary | ICD-10-CM

## 2016-02-04 DIAGNOSIS — I2510 Atherosclerotic heart disease of native coronary artery without angina pectoris: Secondary | ICD-10-CM | POA: Diagnosis present

## 2016-02-04 DIAGNOSIS — D696 Thrombocytopenia, unspecified: Secondary | ICD-10-CM | POA: Diagnosis present

## 2016-02-04 DIAGNOSIS — E1165 Type 2 diabetes mellitus with hyperglycemia: Secondary | ICD-10-CM | POA: Diagnosis present

## 2016-02-04 DIAGNOSIS — J209 Acute bronchitis, unspecified: Secondary | ICD-10-CM

## 2016-02-04 DIAGNOSIS — E1142 Type 2 diabetes mellitus with diabetic polyneuropathy: Secondary | ICD-10-CM | POA: Diagnosis present

## 2016-02-04 DIAGNOSIS — Z8673 Personal history of transient ischemic attack (TIA), and cerebral infarction without residual deficits: Secondary | ICD-10-CM

## 2016-02-04 LAB — CBC
HEMATOCRIT: 37.7 % — AB (ref 40.0–52.0)
HEMOGLOBIN: 12.3 g/dL — AB (ref 13.0–18.0)
MCH: 28.5 pg (ref 26.0–34.0)
MCHC: 32.6 g/dL (ref 32.0–36.0)
MCV: 87.6 fL (ref 80.0–100.0)
Platelets: 120 10*3/uL — ABNORMAL LOW (ref 150–440)
RBC: 4.3 MIL/uL — AB (ref 4.40–5.90)
RDW: 12.3 % (ref 11.5–14.5)
WBC: 6.4 10*3/uL (ref 3.8–10.6)

## 2016-02-04 LAB — BASIC METABOLIC PANEL
ANION GAP: 6 (ref 5–15)
BUN: 38 mg/dL — ABNORMAL HIGH (ref 6–20)
CO2: 25 mmol/L (ref 22–32)
Calcium: 8.4 mg/dL — ABNORMAL LOW (ref 8.9–10.3)
Chloride: 102 mmol/L (ref 101–111)
Creatinine, Ser: 1.76 mg/dL — ABNORMAL HIGH (ref 0.61–1.24)
GFR calc non Af Amer: 31 mL/min — ABNORMAL LOW (ref >60)
GFR, EST AFRICAN AMERICAN: 36 mL/min — AB (ref >60)
GLUCOSE: 494 mg/dL — AB (ref 65–99)
POTASSIUM: 4.8 mmol/L (ref 3.5–5.1)
Sodium: 133 mmol/L — ABNORMAL LOW (ref 135–145)

## 2016-02-04 LAB — TROPONIN I

## 2016-02-04 MED ORDER — IPRATROPIUM-ALBUTEROL 0.5-2.5 (3) MG/3ML IN SOLN
RESPIRATORY_TRACT | Status: AC
  Filled 2016-02-04: qty 3

## 2016-02-04 MED ORDER — IPRATROPIUM-ALBUTEROL 0.5-2.5 (3) MG/3ML IN SOLN
3.0000 mL | Freq: Once | RESPIRATORY_TRACT | Status: AC
  Administered 2016-02-04: 3 mL via RESPIRATORY_TRACT

## 2016-02-04 MED ORDER — ALBUTEROL SULFATE (2.5 MG/3ML) 0.083% IN NEBU
2.5000 mg | INHALATION_SOLUTION | Freq: Once | RESPIRATORY_TRACT | Status: AC
  Administered 2016-02-04: 2.5 mg via RESPIRATORY_TRACT
  Filled 2016-02-04: qty 3

## 2016-02-04 MED ORDER — SODIUM CHLORIDE 0.9 % IV SOLN
1000.0000 mL | Freq: Once | INTRAVENOUS | Status: AC
  Administered 2016-02-04: 1000 mL via INTRAVENOUS

## 2016-02-04 MED ORDER — ALBUTEROL SULFATE (2.5 MG/3ML) 0.083% IN NEBU
2.5000 mg | INHALATION_SOLUTION | Freq: Once | RESPIRATORY_TRACT | Status: AC
  Administered 2016-02-04: 2.5 mg via RESPIRATORY_TRACT

## 2016-02-04 MED ORDER — ALBUTEROL SULFATE (2.5 MG/3ML) 0.083% IN NEBU
INHALATION_SOLUTION | RESPIRATORY_TRACT | Status: AC
  Administered 2016-02-04: 2.5 mg via RESPIRATORY_TRACT
  Filled 2016-02-04: qty 3

## 2016-02-04 NOTE — ED Notes (Signed)
Patient with cough and shortness of breath since Friday. Patient states that it has become worse.

## 2016-02-04 NOTE — ED Provider Notes (Signed)
[facility] Emergency Department Provider Note  ____________________________________________    I have reviewed the triage vital signs and the nursing notes.   HISTORY  Chief Complaint Cough and Shortness of Breath    HPI Evan [name] is a 82 y.o. male who presents with complaints of cough and shortness of breath for nearly a week. He reports symptoms have gotten worse over the last several days. He denies chest pain. He feels that his chest is congested and that he cannot cough up the sputum. No recent travel. No calf pain or swelling. No fevers or chills. No sick contacts.     Past Medical History  Diagnosis Date  . CAD (coronary artery disease)   . Diabetes mellitus   . GERD (gastroesophageal reflux disease)   . Hyperlipidemia   . Arthritis   . Hypertension   . BPH (benign prostatic hypertrophy)   . Degenerative disc disease   . Polymyalgia rheumatica ([place])   . CVA (cerebral vascular accident) ([place])   . Gastroenteritis   . Chronic kidney disease, stage III (moderate)     Patient Active Problem List   Diagnosis Date Noted  . Dysphagia 10/15/2015  . UTI (lower urinary tract infection) 09/02/2015  . Mild malnutrition ([place]) 05/01/2015  . Nocturnal hypoxemia 06/12/2014  . Diabetes, polyneuropathy ([place]) 10/22/2013  . CKD stage 3 due to type 2 diabetes mellitus ([place]) 10/22/2013  . Type 2 diabetes mellitus with renal manifestations, controlled 10/22/2013  . Routine general medical examination at a health care facility 06/05/2013  . Chronic constipation 07/31/2012  . POLYMYALGIA RHEUMATICA 01/25/2010  . Essential hypertension, benign 08/28/2008  . [employer] DISEASE, LUMBAR SPINE 08/28/2008  . OSTEOARTHRITIS 05/07/2007  . Type 2 diabetes, controlled, with neuropathy ([place]) 04/27/2007  . ATHEROSCLEROTIC CARDIOVASCULAR DISEASE 04/27/2007  . BENIGN PROSTATIC HYPERTROPHY 04/27/2007  . HYPERLIPIDEMIA 04/20/2007  . Coronary atherosclerosis  of native coronary artery 04/20/2007  . GERD 04/20/2007    Past Surgical History  Procedure Laterality Date  . Cataract extraction  1999 1992    glaucoma  . Angioplasty    . Esophageal dilation    . US echocardiography  04/2004    benign  . Esophagogastroduodenoscopy N/A 04/09/2015    Procedure: ESOPHAGOGASTRODUODENOSCOPY (EGD);  Surgeon: Josefine Class, MD;  Location: [facility] ENDOSCOPY;  Service: Endoscopy;  Laterality: N/A;  . Savory dilation N/A 04/09/2015    Procedure: SAVORY DILATION;  Surgeon: Josefine Class, MD;  Location: [facility] ENDOSCOPY;  Service: Endoscopy;  Laterality: N/A;    Current Outpatient Rx  Name  Route  Sig  Dispense  Refill  . aspirin EC 81 MG tablet   Oral   Take 81 mg by mouth daily.         . Blood Glucose Monitoring Suppl (ONE TOUCH ULTRA 2) w/Device KIT   Does not apply   1 Device by Does not apply route once. Diagnosis: Diabetes Type 2 DX Code: E11.40   1 each   0   . brimonidine-timolol (COMBIGAN) 0.2-0.5 % ophthalmic solution   Both Eyes   Place 1 drop into both eyes 2 (two) times daily.         . fluticasone (FLONASE) 50 MCG/ACT nasal spray   Each Nare   Place 1 spray into both nostrils daily.         Marland Kitchen gabapentin (NEURONTIN) 100 MG capsule   Oral   Take 1 capsule (100 mg total) by mouth daily after breakfast.   30 capsule  1   . gabapentin (NEURONTIN) 100 MG capsule   Oral   Take 2 capsules (200 mg total) by mouth at bedtime.   30 capsule   1   . glipiZIDE (GLUCOTROL XL) 2.5 MG 24 hr tablet   Oral   Take 1 tablet (2.5 mg total) by mouth daily.   30 tablet   11   . glucose blood (ONE TOUCH TEST STRIPS) test strip      Test blood sugar 3 times a day.   100 each   5     Diagnosis: Diabetes Type 2 DX Code: E11.40   . hydrALAZINE (APRESOLINE) 50 MG tablet   Oral   Take 1 tablet (50 mg total) by mouth 4 (four) times daily.   360 tablet   3   . insulin lispro (HUMALOG) 100 UNIT/ML injection   Subcutaneous    Inject into the skin 3 (three) times daily before meals. Pt uses per sliding scale.         Marland Kitchen lisinopril (PRINIVIL,ZESTRIL) 5 MG tablet      TAKE 1 TABLET DAILY.   30 tablet   0   . nitroGLYCERIN (NITROSTAT) 0.4 MG SL tablet   Sublingual   Place 0.4 mg under the tongue every 5 (five) minutes as needed for chest pain.          Marland Kitchen omeprazole (PRILOSEC) 40 MG capsule   Oral   Take 40 mg by mouth daily.         Letta Pate DELICA LANCETS 33G MISC   Does not apply   1 strip by Does not apply route 3 (three) times daily with meals. Diagnosis: Diabetes Type 2 DX Code: E11.40   100 each   5   . pravastatin (PRAVACHOL) 40 MG tablet   Oral   Take 40 mg by mouth at bedtime.         . predniSONE (DELTASONE) 10 MG tablet   Oral   Take 1 tablet (10 mg total) by mouth every other day.   100 tablet   1   . predniSONE (DELTASONE) 10 MG tablet      Take 1 tablet (10 mg total) by mouth daily.   100 tablet   1   . tamsulosin (FLOMAX) 0.4 MG CAPS capsule   Oral   Take 1 capsule (0.4 mg total) by mouth daily.   30 capsule   11   . timolol (TIMOPTIC) 0.5 % ophthalmic solution   Both Eyes   Place 1 drop into both eyes daily.         . traMADol (ULTRAM) 50 MG tablet      TAKE (1) TABLET THREE TIMES DAILY AS NEEDED FOR PAIN.   30 tablet   0   . verapamil (CALAN) 40 MG tablet      TAKE 1 TABLET TWICE A DAY   180 tablet   3     Allergies Penicillins  No family history on file.  Social History Social History  Substance Use Topics  . Smoking status: Former Games developer  . Smokeless tobacco: Never Used     Comment: quit over 20 years ago  . Alcohol Use: No    Review of Systems  Constitutional: Negative for fever. Eyes: Negative for redness ENT: Negative for sore throat Cardiovascular: Negative for chest pain Respiratory:Cough as above Gastrointestinal: Negative for abdominal pain Genitourinary: Negative for dysuria. Musculoskeletal: Negative for back  pain. Skin: Negative for rash. Neurological: Negative for focal weakness Psychiatric:  no anxiety    ____________________________________________   PHYSICAL EXAM:  VITAL SIGNS: ED Triage Vitals  Enc Vitals Group     BP 02/04/16 1954 160/70 mmHg     Pulse Rate 02/04/16 1954 63     Resp 02/04/16 1954 20     Temp 02/04/16 1954 97.8 F (36.6 C)     Temp Source 02/04/16 1954 Oral     SpO2 02/04/16 1954 95 %     Weight 02/04/16 1954 164 lb (74.39 kg)     Height 02/04/16 1954 '6\' 3"'$  (1.905 m)     Head Cir --      Peak Flow --      Pain Score --      Pain Loc --      Pain Edu? --      Excl. in [place]? --      Constitutional: Alert and oriented. Well appearing and in no distress.  Eyes: Conjunctivae are normal. No erythema or injection ENT   Head: Normocephalic and atraumatic.   Mouth/Throat: Mucous membranes are moist. Cardiovascular: Normal rate, regular rhythm. Normal and symmetric distal pulses are present in the upper extremities. No murmurs or rubs  Respiratory: Normal respiratory effort without tachypnea nor retractions. Mild scattered wheezes noted Gastrointestinal: Soft and non-tender in all quadrants. No distention. There is no CVA tenderness. Genitourinary: deferred Musculoskeletal: Nontender with normal range of motion in all extremities. No lower extremity tenderness nor edema. Neurologic:  Normal speech and language. No gross focal neurologic deficits are appreciated. Skin:  Skin is warm, dry and intact. No rash noted. Psychiatric: Mood and affect are normal. Patient exhibits appropriate insight and judgment.  ____________________________________________    LABS (pertinent positives/negatives)  Labs Reviewed  BASIC METABOLIC PANEL - Abnormal; Notable for the following:    Sodium 133 (*)    Glucose, Bld 494 (*)    BUN 38 (*)    Creatinine, Ser 1.76 (*)    Calcium 8.4 (*)    GFR calc non Af Amer 31 (*)    GFR calc Af Amer 36 (*)    All other components  within normal limits  CBC - Abnormal; Notable for the following:    RBC 4.30 (*)    Hemoglobin 12.3 (*)    HCT 37.7 (*)    Platelets 120 (*)    All other components within normal limits  TROPONIN I    ____________________________________________   EKG  ED ECG REPORT I, Lavonia Drafts, the attending physician, personally viewed and interpreted this ECG.  Date: 02/04/2016 EKG Time: 7:57 PM Rate: 63 Rhythm: normal sinus rhythm QRS Axis: normal Intervals: normal ST/T Wave abnormalities: Nonspecific Conduction Disturbances: none    ____________________________________________    RADIOLOGY  Questionable interstitial lung disease, mildly hyperinflated lungs  ____________________________________________   PROCEDURES  Procedure(s) performed: none  Critical Care performed: none  ____________________________________________   INITIAL IMPRESSION / ASSESSMENT AND PLAN / ED COURSE  Pertinent labs & imaging results that were available during my care of the patient were reviewed by me and considered in my medical decision making (see chart for details).  Patient treated with DuoNeb's for suspected bronchospasm and IV fluids for elevated glucose.  ----------------------------------------- 11:23 PM on 02/04/2016 -----------------------------------------  Patient with pulse ox of 90% with mild tachypnea. I will admit him to the hospital for further management given his history of diabetes and new shortness of breath ____________________________________________   FINAL CLINICAL IMPRESSION(S) / ED DIAGNOSES  Final diagnoses:  Shortness of breath  Lavonia Drafts, MD 02/04/16 [id number]

## 2016-02-05 ENCOUNTER — Encounter: Payer: Self-pay | Admitting: Internal Medicine

## 2016-02-05 DIAGNOSIS — E1142 Type 2 diabetes mellitus with diabetic polyneuropathy: Secondary | ICD-10-CM | POA: Diagnosis present

## 2016-02-05 DIAGNOSIS — M7989 Other specified soft tissue disorders: Secondary | ICD-10-CM | POA: Diagnosis present

## 2016-02-05 DIAGNOSIS — I10 Essential (primary) hypertension: Secondary | ICD-10-CM | POA: Diagnosis present

## 2016-02-05 DIAGNOSIS — Z87891 Personal history of nicotine dependence: Secondary | ICD-10-CM | POA: Diagnosis not present

## 2016-02-05 DIAGNOSIS — Z7982 Long term (current) use of aspirin: Secondary | ICD-10-CM | POA: Diagnosis not present

## 2016-02-05 DIAGNOSIS — J440 Chronic obstructive pulmonary disease with acute lower respiratory infection: Secondary | ICD-10-CM | POA: Diagnosis present

## 2016-02-05 DIAGNOSIS — E785 Hyperlipidemia, unspecified: Secondary | ICD-10-CM | POA: Diagnosis present

## 2016-02-05 DIAGNOSIS — M353 Polymyalgia rheumatica: Secondary | ICD-10-CM | POA: Diagnosis present

## 2016-02-05 DIAGNOSIS — Z880 Allergy status to penicillin: Secondary | ICD-10-CM | POA: Diagnosis not present

## 2016-02-05 DIAGNOSIS — K219 Gastro-esophageal reflux disease without esophagitis: Secondary | ICD-10-CM | POA: Diagnosis present

## 2016-02-05 DIAGNOSIS — Z8673 Personal history of transient ischemic attack (TIA), and cerebral infarction without residual deficits: Secondary | ICD-10-CM | POA: Diagnosis not present

## 2016-02-05 DIAGNOSIS — Z794 Long term (current) use of insulin: Secondary | ICD-10-CM | POA: Diagnosis not present

## 2016-02-05 DIAGNOSIS — M1990 Unspecified osteoarthritis, unspecified site: Secondary | ICD-10-CM | POA: Diagnosis present

## 2016-02-05 DIAGNOSIS — J209 Acute bronchitis, unspecified: Secondary | ICD-10-CM | POA: Diagnosis present

## 2016-02-05 DIAGNOSIS — D696 Thrombocytopenia, unspecified: Secondary | ICD-10-CM | POA: Diagnosis present

## 2016-02-05 DIAGNOSIS — E1140 Type 2 diabetes mellitus with diabetic neuropathy, unspecified: Secondary | ICD-10-CM | POA: Diagnosis present

## 2016-02-05 DIAGNOSIS — E1165 Type 2 diabetes mellitus with hyperglycemia: Secondary | ICD-10-CM | POA: Diagnosis present

## 2016-02-05 DIAGNOSIS — K5909 Other constipation: Secondary | ICD-10-CM | POA: Diagnosis present

## 2016-02-05 DIAGNOSIS — Z79899 Other long term (current) drug therapy: Secondary | ICD-10-CM | POA: Diagnosis not present

## 2016-02-05 DIAGNOSIS — J449 Chronic obstructive pulmonary disease, unspecified: Secondary | ICD-10-CM | POA: Diagnosis present

## 2016-02-05 DIAGNOSIS — N400 Enlarged prostate without lower urinary tract symptoms: Secondary | ICD-10-CM | POA: Diagnosis present

## 2016-02-05 DIAGNOSIS — E1122 Type 2 diabetes mellitus with diabetic chronic kidney disease: Secondary | ICD-10-CM | POA: Diagnosis present

## 2016-02-05 DIAGNOSIS — N183 Chronic kidney disease, stage 3 (moderate): Secondary | ICD-10-CM | POA: Diagnosis present

## 2016-02-05 DIAGNOSIS — Z7952 Long term (current) use of systemic steroids: Secondary | ICD-10-CM | POA: Diagnosis not present

## 2016-02-05 DIAGNOSIS — M5136 Other intervertebral disc degeneration, lumbar region: Secondary | ICD-10-CM | POA: Diagnosis present

## 2016-02-05 DIAGNOSIS — I2510 Atherosclerotic heart disease of native coronary artery without angina pectoris: Secondary | ICD-10-CM | POA: Diagnosis present

## 2016-02-05 DIAGNOSIS — R490 Dysphonia: Secondary | ICD-10-CM | POA: Diagnosis present

## 2016-02-05 DIAGNOSIS — R0602 Shortness of breath: Secondary | ICD-10-CM | POA: Diagnosis present

## 2016-02-05 LAB — CBC
HCT: 36.4 % — ABNORMAL LOW (ref 40.0–52.0)
Hemoglobin: 11.8 g/dL — ABNORMAL LOW (ref 13.0–18.0)
MCH: 28.1 pg (ref 26.0–34.0)
MCHC: 32.3 g/dL (ref 32.0–36.0)
MCV: 87 fL (ref 80.0–100.0)
PLATELETS: 114 10*3/uL — AB (ref 150–440)
RBC: 4.19 MIL/uL — ABNORMAL LOW (ref 4.40–5.90)
RDW: 12.1 % (ref 11.5–14.5)
WBC: 8 10*3/uL (ref 3.8–10.6)

## 2016-02-05 LAB — BASIC METABOLIC PANEL
Anion gap: 7 (ref 5–15)
BUN: 35 mg/dL — AB (ref 6–20)
CALCIUM: 8.3 mg/dL — AB (ref 8.9–10.3)
CO2: 24 mmol/L (ref 22–32)
CREATININE: 1.65 mg/dL — AB (ref 0.61–1.24)
Chloride: 106 mmol/L (ref 101–111)
GFR calc non Af Amer: 34 mL/min — ABNORMAL LOW (ref >60)
GFR, EST AFRICAN AMERICAN: 39 mL/min — AB (ref >60)
Glucose, Bld: 339 mg/dL — ABNORMAL HIGH (ref 65–99)
Potassium: 4.5 mmol/L (ref 3.5–5.1)
SODIUM: 137 mmol/L (ref 135–145)

## 2016-02-05 LAB — GLUCOSE, CAPILLARY
GLUCOSE-CAPILLARY: 364 mg/dL — AB (ref 65–99)
GLUCOSE-CAPILLARY: 236 mg/dL — AB (ref 65–99)
Glucose-Capillary: 232 mg/dL — ABNORMAL HIGH (ref 65–99)
Glucose-Capillary: 361 mg/dL — ABNORMAL HIGH (ref 65–99)
Glucose-Capillary: 312 mg/dL — ABNORMAL HIGH (ref 65–99)

## 2016-02-05 LAB — INFLUENZA PANEL BY PCR (TYPE A & B)
H1N1 flu by pcr: NOT DETECTED
INFLBPCR: POSITIVE — AB
Influenza A By PCR: NEGATIVE

## 2016-02-05 LAB — HEMOGLOBIN A1C: Hgb A1c MFr Bld: 9.6 % — ABNORMAL HIGH (ref 4.0–6.0)

## 2016-02-05 MED ORDER — GLIPIZIDE ER 2.5 MG PO TB24
2.5000 mg | ORAL_TABLET | Freq: Every day | ORAL | Status: DC
  Administered 2016-02-05 – 2016-02-06 (×2): 2.5 mg via ORAL
  Filled 2016-02-05 (×4): qty 1

## 2016-02-05 MED ORDER — SALINE SPRAY 0.65 % NA SOLN
1.0000 | NASAL | Status: DC | PRN
  Filled 2016-02-05: qty 44

## 2016-02-05 MED ORDER — PREDNISONE 20 MG PO TABS: 40.0000 mg | ORAL_TABLET | Freq: Every day | ORAL | Status: DC

## 2016-02-05 MED ORDER — HYDRALAZINE HCL 20 MG/ML IJ SOLN: 10.0000 mg | INTRAMUSCULAR | Status: DC | PRN

## 2016-02-05 MED ORDER — IPRATROPIUM-ALBUTEROL 0.5-2.5 (3) MG/3ML IN SOLN
3.0000 mL | Freq: Two times a day (BID) | RESPIRATORY_TRACT | Status: DC
  Administered 2016-02-05: 3 mL via RESPIRATORY_TRACT
  Filled 2016-02-05: qty 3

## 2016-02-05 MED ORDER — INFLUENZA VAC SPLIT QUAD 0.5 ML IM SUSY: 0.5000 mL | PREFILLED_SYRINGE | INTRAMUSCULAR | Status: DC

## 2016-02-05 MED ORDER — HYDRALAZINE HCL 50 MG PO TABS: 50.0000 mg | ORAL_TABLET | Freq: Four times a day (QID) | ORAL | Status: DC

## 2016-02-05 MED ORDER — PRAVASTATIN SODIUM 20 MG PO TABS
40.0000 mg | ORAL_TABLET | Freq: Every day | ORAL | Status: DC
  Administered 2016-02-05: 40 mg via ORAL
  Filled 2016-02-05: qty 2

## 2016-02-05 MED ORDER — OSELTAMIVIR PHOSPHATE 75 MG PO CAPS: 75.0000 mg | ORAL_CAPSULE | Freq: Every day | ORAL | Status: DC

## 2016-02-05 MED ORDER — NITROGLYCERIN 0.4 MG SL SUBL: 0.4000 mg | SUBLINGUAL_TABLET | SUBLINGUAL | Status: DC | PRN

## 2016-02-05 MED ORDER — HYDRALAZINE HCL 50 MG PO TABS
100.0000 mg | ORAL_TABLET | Freq: Four times a day (QID) | ORAL | Status: DC
  Administered 2016-02-05 – 2016-02-06 (×5): 100 mg via ORAL
  Filled 2016-02-05 (×5): qty 2

## 2016-02-05 MED ORDER — METHYLPREDNISOLONE SODIUM SUCC 125 MG IJ SOLR
60.0000 mg | Freq: Four times a day (QID) | INTRAMUSCULAR | Status: DC
  Administered 2016-02-05 (×3): 60 mg via INTRAVENOUS
  Filled 2016-02-05 (×3): qty 2

## 2016-02-05 MED ORDER — VERAPAMIL HCL 80 MG PO TABS
40.0000 mg | ORAL_TABLET | Freq: Two times a day (BID) | ORAL | Status: DC
  Administered 2016-02-05 – 2016-02-06 (×3): 40 mg via ORAL
  Filled 2016-02-05 (×3): qty 1

## 2016-02-05 MED ORDER — OSELTAMIVIR PHOSPHATE 75 MG PO CAPS
75.0000 mg | ORAL_CAPSULE | Freq: Once | ORAL | Status: AC
  Administered 2016-02-05: 75 mg via ORAL
  Filled 2016-02-05: qty 1

## 2016-02-05 MED ORDER — GLUCERNA PO LIQD
237.0000 mL | Freq: Two times a day (BID) | ORAL | Status: DC
  Administered 2016-02-05 (×2): 237 mL via ORAL

## 2016-02-05 MED ORDER — FLUTICASONE PROPIONATE 50 MCG/ACT NA SUSP
1.0000 | Freq: Every day | NASAL | Status: DC
  Administered 2016-02-05 – 2016-02-06 (×2): 1 via NASAL
  Filled 2016-02-05 (×2): qty 16

## 2016-02-05 MED ORDER — HEPARIN SODIUM (PORCINE) 5000 UNIT/ML IJ SOLN
5000.0000 [IU] | Freq: Three times a day (TID) | INTRAMUSCULAR | Status: DC
  Administered 2016-02-05 – 2016-02-06 (×4): 5000 [IU] via SUBCUTANEOUS
  Filled 2016-02-05 (×4): qty 1

## 2016-02-05 MED ORDER — ASPIRIN EC 81 MG PO TBEC
81.0000 mg | DELAYED_RELEASE_TABLET | Freq: Every day | ORAL | Status: DC
  Administered 2016-02-05 – 2016-02-06 (×2): 81 mg via ORAL
  Filled 2016-02-05 (×2): qty 1

## 2016-02-05 MED ORDER — TIMOLOL MALEATE 0.5 % OP SOLN: 1.0000 [drp] | Freq: Every day | OPHTHALMIC | Status: DC

## 2016-02-05 MED ORDER — DEXTROSE 5 % IV SOLN
500.0000 mg | INTRAVENOUS | Status: DC
  Administered 2016-02-05: 500 mg via INTRAVENOUS
  Filled 2016-02-05 (×2): qty 500

## 2016-02-05 MED ORDER — TRAMADOL HCL 50 MG PO TABS
50.0000 mg | ORAL_TABLET | Freq: Four times a day (QID) | ORAL | Status: DC | PRN
  Administered 2016-02-06: 50 mg via ORAL
  Filled 2016-02-05: qty 1

## 2016-02-05 MED ORDER — GABAPENTIN 100 MG PO CAPS
200.0000 mg | ORAL_CAPSULE | Freq: Every day | ORAL | Status: DC
  Administered 2016-02-05: 200 mg via ORAL
  Filled 2016-02-05: qty 2

## 2016-02-05 MED ORDER — PANTOPRAZOLE SODIUM 40 MG PO TBEC
40.0000 mg | DELAYED_RELEASE_TABLET | Freq: Every day | ORAL | Status: DC
  Administered 2016-02-05 – 2016-02-06 (×2): 40 mg via ORAL
  Filled 2016-02-05 (×2): qty 1

## 2016-02-05 MED ORDER — INSULIN ASPART 100 UNIT/ML ~~LOC~~ SOLN
10.0000 [IU] | Freq: Once | SUBCUTANEOUS | Status: AC
  Administered 2016-02-05: 10 [IU] via SUBCUTANEOUS
  Filled 2016-02-05: qty 10

## 2016-02-05 MED ORDER — IPRATROPIUM-ALBUTEROL 0.5-2.5 (3) MG/3ML IN SOLN
3.0000 mL | RESPIRATORY_TRACT | Status: DC
  Administered 2016-02-05 (×3): 3 mL via RESPIRATORY_TRACT
  Filled 2016-02-05 (×3): qty 3

## 2016-02-05 MED ORDER — HYDRALAZINE HCL 100 MG PO TABS: 100.0000 mg | ORAL_TABLET | Freq: Four times a day (QID) | ORAL | Status: DC

## 2016-02-05 MED ORDER — LISINOPRIL 5 MG PO TABS: 5.0000 mg | ORAL_TABLET | Freq: Every day | ORAL | Status: DC

## 2016-02-05 MED ORDER — TIMOLOL MALEATE 0.5 % OP SOLN
1.0000 [drp] | Freq: Two times a day (BID) | OPHTHALMIC | Status: DC
  Administered 2016-02-05 – 2016-02-06 (×3): 1 [drp] via OPHTHALMIC
  Filled 2016-02-05 (×2): qty 5

## 2016-02-05 MED ORDER — LORATADINE 10 MG PO TABS
10.0000 mg | ORAL_TABLET | Freq: Every day | ORAL | Status: DC
  Administered 2016-02-05 – 2016-02-06 (×2): 10 mg via ORAL
  Filled 2016-02-05 (×2): qty 1

## 2016-02-05 MED ORDER — TAMSULOSIN HCL 0.4 MG PO CAPS
0.4000 mg | ORAL_CAPSULE | Freq: Every day | ORAL | Status: DC
  Administered 2016-02-05 – 2016-02-06 (×2): 0.4 mg via ORAL
  Filled 2016-02-05 (×2): qty 1

## 2016-02-05 MED ORDER — BRIMONIDINE TARTRATE 0.2 % OP SOLN
1.0000 [drp] | Freq: Two times a day (BID) | OPHTHALMIC | Status: DC
  Administered 2016-02-05 – 2016-02-06 (×3): 1 [drp] via OPHTHALMIC
  Filled 2016-02-05 (×2): qty 5

## 2016-02-05 MED ORDER — GLUCERNA SHAKE PO LIQD
237.0000 mL | Freq: Three times a day (TID) | ORAL | Status: DC
  Administered 2016-02-05: 237 mL via ORAL

## 2016-02-05 MED ORDER — AZITHROMYCIN 250 MG PO TABS: 250.0000 mg | ORAL_TABLET | Freq: Every day | ORAL | Status: DC

## 2016-02-05 MED ORDER — GABAPENTIN 100 MG PO CAPS
100.0000 mg | ORAL_CAPSULE | Freq: Every day | ORAL | Status: DC
  Administered 2016-02-05 – 2016-02-06 (×2): 100 mg via ORAL
  Filled 2016-02-05 (×2): qty 1

## 2016-02-05 MED ORDER — BRIMONIDINE TARTRATE-TIMOLOL 0.2-0.5 % OP SOLN: 1.0000 [drp] | Freq: Two times a day (BID) | OPHTHALMIC | Status: DC

## 2016-02-05 MED ORDER — INSULIN ASPART 100 UNIT/ML ~~LOC~~ SOLN
0.0000 [IU] | Freq: Three times a day (TID) | SUBCUTANEOUS | Status: DC
  Administered 2016-02-05: 11 [IU] via SUBCUTANEOUS
  Administered 2016-02-05: 15 [IU] via SUBCUTANEOUS
  Administered 2016-02-05 – 2016-02-06 (×2): 5 [IU] via SUBCUTANEOUS
  Administered 2016-02-06: 11 [IU] via SUBCUTANEOUS
  Filled 2016-02-05: qty 15
  Filled 2016-02-05: qty 11
  Filled 2016-02-05: qty 5
  Filled 2016-02-05: qty 11
  Filled 2016-02-05: qty 5

## 2016-02-05 MED ORDER — LISINOPRIL 20 MG PO TABS
20.0000 mg | ORAL_TABLET | Freq: Every day | ORAL | Status: DC
  Administered 2016-02-05 – 2016-02-06 (×2): 20 mg via ORAL
  Filled 2016-02-05 (×2): qty 1

## 2016-02-05 NOTE — Progress Notes (Signed)
Inpatient Diabetes Program Recommendations  AACE/ADA: New Consensus Statement on Inpatient Glycemic Control (2015)  Target Ranges:  Prepandial:   less than 140 mg/dL      Peak postprandial:   less than 180 mg/dL (1-2 hours)      Critically ill patients:  140 - 180 mg/dL   Review of Glycemic Control  Results for Evan [name], Evan [name] (MRN [id number]) as of 02/05/2016 08:00  Ref. Range 02/05/2016 05:07  Glucose-Capillary Latest Ref Range: 65-99 mg/dL 409364 (H)    Diabetes history: Type 2 diabetes with decreased renal function Outpatient Diabetes medications: Glipizide 2.5mg , Humalog 2 units tid with meals Current orders for Inpatient glycemic control: Glipizide 2.5mg /day, Novolog 0-15 units tid with meals.   *prednisone 60mg  q6h  Inpatient Diabetes Program Recommendations: Please consider stopping the Glipizide while the patient is in the hospital, consider low dose Lantus insulin 8 units qhs.   Susette RacerJulie Einer Meals, RN, BA, MHA, CDE Diabetes Coordinator Inpatient Diabetes Program  [phone] (Team Pager) [phone] [facility] Office) 02/05/2016 8:06 AM

## 2016-02-05 NOTE — Discharge Instructions (Signed)

## 2016-02-05 NOTE — Progress Notes (Signed)
Family refused for patient to be discharged. MD notified and spoke with family. Pt will be staying another night.

## 2016-02-05 NOTE — Care Management Note (Signed)
Case Management Note  Patient Details  Name: Prudencio BurlySeawell C Funderburke MRN: [id number] Date of Birth: 08/21/1918  Subjective/Objective:           Patient admitted with SOB.  Found to be flu positive.  Patient lives home alone.  Has 2 hours of aide services 7 days a week provided by the [facility].  Patient has family that lives locally that provides support and transportation.      I have faxed facesheet and H&P to the [facility].  [facility] within 24-48 hours consent for transfer will not need to be completed.  I have spoken with attending and patient will be discharged within this time frame.  If this should changed consent for transfer will need to be completed.     Action/Plan:   Expected Discharge Date:                  Expected Discharge Plan:     In-House Referral:     Discharge planning Services     Post Acute Care Choice:    Choice offered to:     DME Arranged:    DME Agency:     HH Arranged:    HH Agency:     Status of Service:     Medicare Important Message Given:  Yes Date Medicare IM Given:    Medicare IM give by:    Date Additional Medicare IM Given:    Additional Medicare Important Message give by:     If discussed at Long Length of Stay Meetings, dates discussed:    Additional Comments:  Chapman FitchBOWEN, Li Fragoso T, RN 02/05/2016, 3:07 PM

## 2016-02-05 NOTE — H&P (Signed)
[place] [facility] [place] NAME: Evan [name]    MR#:  [id number]  DATE OF BIRTH:  07/10/1918  DATE OF ADMISSION:  02/04/2016  PRIMARY CARE PHYSICIAN: Viviana Simpler, MD   REQUESTING/REFERRING PHYSICIAN: Dr Corky Downs  CHIEF COMPLAINT:   Chief Complaint  Patient presents with  . Cough  . Shortness of Breath    HISTORY OF PRESENT ILLNESS: Evan [name]  is a 82 y.o. male with a known history of Coronary artery disease, diabetes mellitus, GERD, hypertension, hyperlipidemia, polymyalgia rheumatica, CVA, BPH and chronic kidney disease stage III. He presents to the ED today due to a 4 day history of worsening cough. Patient states that about 4 days ago, he developed some congestion in his chest and no stools and subsequently started having some cough. Cough is nonproductive and is occasionally associated with chest pain. Patient denies any fever but states that he has been having chills. His voice has become hoarse for the last 4 days. He denies any recent travel or contact with sick persons. He also denies any nausea or vomiting. He denies any palpitations, diaphoresis, abdominal pain, changes in bowel or urine habits or changes in appetite. He has chronic lower extremity swelling and that's unchanged. He had occasional wheezing but denies any PND, orthopnea or neck distention. He denies any generalized weakness as well. Patient complains that he is blood glucose has been increasing in the last 24 hours and his morning glucose was 277 compared to his usual mid 100s. He does not drink alcohol, smoke cigarettes or use recreational drugs. He is compliant with his medications.  On arrival in the ED, blood pressure was 219/92 and rest of the vital signs are stable. Last A1c was 7.4 in December 2016. CMP was significant for a sodium of 133 and a BUN of 38 with creatinine of 1.76. Glucose was elevated at 494. CBC was only significant for a platelet count of 120.  Chest x-ray showed perihilar reticular opacities bilaterally suggestive of interstitial disease as well as hyperinflation consistent with COPD. Troponin was negative. EKG showed normal sinus rhythm. Patient received DuoNeb's in the ED and will be admitted due to COPD with acute bronchitis as well as accelerated hypertension.  He wishes to be full code.  PAST MEDICAL HISTORY:   Past Medical History  Diagnosis Date  . CAD (coronary artery disease)   . Diabetes mellitus   . GERD (gastroesophageal reflux disease)   . Hyperlipidemia   . Arthritis   . Hypertension   . BPH (benign prostatic hypertrophy)   . Degenerative disc disease   . Polymyalgia rheumatica ([place])   . CVA (cerebral vascular accident) ([place])   . Gastroenteritis   . Chronic kidney disease, stage III (moderate)     PAST SURGICAL HISTORY:  Past Surgical History  Procedure Laterality Date  . Cataract extraction  1999 1992    glaucoma  . Angioplasty    . Esophageal dilation    . US echocardiography  04/2004    benign  . Esophagogastroduodenoscopy N/A 04/09/2015    Procedure: ESOPHAGOGASTRODUODENOSCOPY (EGD);  Surgeon: Josefine Class, MD;  Location: [facility] ENDOSCOPY;  Service: Endoscopy;  Laterality: N/A;  . Savory dilation N/A 04/09/2015    Procedure: SAVORY DILATION;  Surgeon: Josefine Class, MD;  Location[facility] ENDOSCOPY;  Service: Endoscopy;  Laterality: N/A;    SOCIAL HISTORY:  Social History  Substance Use Topics  . Smoking status: Former Research scientist (life sciences)  . Smokeless tobacco: Never Used  Comment: quit over 20 years ago  . Alcohol Use: No    FAMILY HISTORY:  Family History  Problem Relation Age of Onset  . Constipation Mother     DRUG ALLERGIES:  Allergies  Allergen Reactions  . Penicillins Rash and Other (See Comments)    Has patient had a PCN reaction causing immediate rash, facial/tongue/throat swelling, SOB or lightheadedness with hypotension: No Has patient had a PCN reaction causing severe rash  involving mucus membranes or skin necrosis: No Has patient had a PCN reaction that required hospitalization No Has patient had a PCN reaction occurring within the last 10 years: No If all of the above answers are "NO", then may proceed with Cephalosporin use.     REVIEW OF SYSTEMS:   CONSTITUTIONAL: No fever, fatigue or weakness.  EYES: No blurred or double vision.  EARS, NOSE, AND THROAT: No tinnitus or ear pain.  RESPIRATORY: As in history of present illness CARDIOVASCULAR: As in history of present illness GASTROINTESTINAL: No nausea, vomiting, diarrhea or abdominal pain.  GENITOURINARY: No dysuria, hematuria.  ENDOCRINE: No polyuria, nocturia,  HEMATOLOGY: No anemia, easy bruising or bleeding SKIN: No rash or lesion. MUSCULOSKELETAL: No joint pain or arthritis.   NEUROLOGIC: No tingling, numbness, weakness.  PSYCHIATRY: No anxiety or depression.   MEDICATIONS AT HOME:  Prior to Admission medications   Medication Sig Start Date End Date Taking? Authorizing Provider  aspirin EC 81 MG tablet Take 81 mg by mouth daily.   Yes Historical Provider, MD  Blood Glucose Monitoring Suppl (ONE TOUCH ULTRA 2) w/Device KIT 1 Device by Does not apply route once. Diagnosis: Diabetes Type 2 DX Code: E11.40 01/14/16  Yes Venia Carbon, MD  brimonidine-timolol (COMBIGAN) 0.2-0.5 % ophthalmic solution Place 1 drop into both eyes 2 (two) times daily.   Yes Historical Provider, MD  fluticasone (FLONASE) 50 MCG/ACT nasal spray Place 1 spray into both nostrils daily.   Yes Historical Provider, MD  gabapentin (NEURONTIN) 100 MG capsule Take 1 capsule (100 mg total) by mouth daily after breakfast. 09/05/15  Yes Gladstone Lighter, MD  gabapentin (NEURONTIN) 100 MG capsule Take 2 capsules (200 mg total) by mouth at bedtime. 09/05/15  Yes Gladstone Lighter, MD  glipiZIDE (GLUCOTROL XL) 2.5 MG 24 hr tablet Take 1 tablet (2.5 mg total) by mouth daily. 10/19/15  Yes Venia Carbon, MD  GLUCERNA [facility]) LIQD  Take 237 mLs by mouth 2 (two) times daily between meals.   Yes Historical Provider, MD  glucose blood (ONE TOUCH TEST STRIPS) test strip Test blood sugar 3 times a day. 01/14/16  Yes Venia Carbon, MD  hydrALAZINE (APRESOLINE) 50 MG tablet Take 1 tablet (50 mg total) by mouth 4 (four) times daily. 11/16/15  Yes Venia Carbon, MD  insulin lispro (HUMALOG) 100 UNIT/ML injection Inject 2 Units into the skin 3 (three) times daily before meals. Pt uses per sliding scale.   Yes Historical Provider, MD  lisinopril (PRINIVIL,ZESTRIL) 5 MG tablet TAKE 1 TABLET DAILY. 01/07/16  Yes Venia Carbon, MD  nitroGLYCERIN (NITROSTAT) 0.4 MG SL tablet Place 0.4 mg under the tongue every 5 (five) minutes as needed for chest pain.    Yes Historical Provider, MD  omeprazole (PRILOSEC) 40 MG capsule Take 40 mg by mouth daily.   Yes Historical Provider, MD  [facility] DELICA LANCETS 38B MISC 1 strip by Does not apply route 3 (three) times daily with meals. Diagnosis: Diabetes Type 2 DX Code: E11.40 01/14/16  Yes Venia Carbon,  MD  pravastatin (PRAVACHOL) 40 MG tablet Take 40 mg by mouth at bedtime.   Yes Historical Provider, MD  predniSONE (DELTASONE) 10 MG tablet Take 1 tablet (10 mg total) by mouth daily. 01/13/16  Yes Venia Carbon, MD  tamsulosin (FLOMAX) 0.4 MG CAPS capsule Take 1 capsule (0.4 mg total) by mouth daily. 11/04/15  Yes Venia Carbon, MD  timolol (TIMOPTIC) 0.5 % ophthalmic solution Place 1 drop into both eyes daily.   Yes Historical Provider, MD  traMADol (ULTRAM) 50 MG tablet TAKE (1) TABLET THREE TIMES DAILY AS NEEDED FOR PAIN. 01/28/16  Yes Venia Carbon, MD  verapamil (CALAN) 40 MG tablet TAKE 1 TABLET TWICE A DAY 09/11/15  Yes Venia Carbon, MD      PHYSICAL EXAMINATION:   VITAL SIGNS: Blood pressure 184/92, pulse 58, temperature 97.8 F (36.6 C), temperature source Oral, resp. rate 12, height 6' 3" (1.905 m), weight 74.39 kg (164 lb), SpO2 95 %.  GENERAL:  82 y.o.-year-old patient  lying in the bed with no acute distress. Alert and oriented x 3. EYES: Pupils equal, round, reactive to light and accommodation. No scleral icterus. Extraocular muscles intact.  HEENT: Head atraumatic, normocephalic. Oropharynx and nasopharynx clear.  NECK:  Supple, no jugular venous distention. No thyroid enlargement, no tenderness.  LUNGS: Good air entry bilaterally with scattered wheezes posteriorly. No use of accessory muscles of respiration.  CARDIOVASCULAR: S1, S2 normal. No murmurs, rubs, or gallops.  ABDOMEN: Soft, nontender, nondistended. Bowel sounds present. No organomegaly or mass.  EXTREMITIES: 2+ pedal edema up to knees bilaterally, cyanosis, or clubbing.  NEUROLOGIC: Cranial nerves II through XII are intact. Muscle strength 5/5 in all extremities. Sensation intact. Gait not checked.   SKIN: No obvious rash, lesion, or ulcer.   LABORATORY PANEL:   CBC  Recent Labs Lab 02/04/16 1959  WBC 6.4  HGB 12.3*  HCT 37.7*  PLT 120*  MCV 87.6  MCH 28.5  MCHC 32.6  RDW 12.3   ------------------------------------------------------------------------------------------------------------------  Chemistries   Recent Labs Lab 02/04/16 1959  NA 133*  K 4.8  CL 102  CO2 25  GLUCOSE 494*  BUN 38*  CREATININE 1.76*  CALCIUM 8.4*   ------------------------------------------------------------------------------------------------------------------ estimated creatinine clearance is 26.4 mL/min (by C-G formula based on Cr of 1.76). ------------------------------------------------------------------------------------------------------------------ No results for input(s): TSH, T4TOTAL, T3FREE, THYROIDAB in the last 72 hours.  Invalid input(s): FREET3   Coagulation profile No results for input(s): INR, PROTIME in the last 168 hours. ------------------------------------------------------------------------------------------------------------------- No results for input(s): DDIMER in  the last 72 hours. -------------------------------------------------------------------------------------------------------------------  Cardiac Enzymes  Recent Labs Lab 02/04/16 1959  TROPONINI <0.03   ------------------------------------------------------------------------------------------------------------------ Invalid input(s): POCBNP  ---------------------------------------------------------------------------------------------------------------  Urinalysis    Component Value Date/Time   COLORURINE YELLOW* 09/02/2015 1711   COLORURINE Yellow 05/31/2014 1505   APPEARANCEUR CLEAR* 09/02/2015 1711   APPEARANCEUR Cloudy 05/31/2014 1505   LABSPEC 1.009 09/02/2015 1711   LABSPEC 1.014 05/31/2014 1505   PHURINE 7.0 09/02/2015 1711   PHURINE 5.0 05/31/2014 1505   GLUCOSEU NEGATIVE 09/02/2015 1711   GLUCOSEU Negative 05/31/2014 1505   HGBUR NEGATIVE 09/02/2015 1711   HGBUR 2+ 05/31/2014 1505   HGBUR trace-lysed 07/28/2008 [place] 09/02/2015 1711   BILIRUBINUR Negative 05/31/2014 1505   KETONESUR NEGATIVE 09/02/2015 1711   KETONESUR Negative 05/31/2014 1505   PROTEINUR 30* 09/02/2015 1711   PROTEINUR 100 mg/dL 05/31/2014 1505   UROBILINOGEN 1.0 06/26/2010 1600   NITRITE NEGATIVE 09/02/2015 1711   NITRITE Negative 05/31/2014  [place] 09/02/2015 1711   LEUKOCYTESUR 3+ 05/31/2014 1505     RADIOLOGY: Dg Chest 2 View  02/04/2016  CLINICAL DATA:  Shortness of breath EXAM: CHEST  2 VIEW COMPARISON:  09/02/2015 chest radiograph. FINDINGS: Stable cardiomediastinal silhouette with normal heart size. No pneumothorax. No pleural effusion. Mildly hyperinflated lungs. No pulmonary edema. Peripheral basilar predominant reticular opacities in both lungs, not definitely changed. No acute consolidative airspace disease. IMPRESSION: 1. Peripheral basilar predominant reticular opacities in both lungs, not definitely changed, which may indicate an  interstitial lung disease. 2. Mildly hyperinflated lungs, suggesting COPD. No acute consolidative airspace disease. Electronically Signed   By: Ilona Sorrel M.D.   On: 02/04/2016 20:18    EKG: Orders placed or performed during the hospital encounter of 02/04/16  . ED EKG  . ED EKG  . EKG 12-Lead  . EKG 12-Lead    ASSESSMENT  Principal Problem:   COPD with acute bronchitis (HCC) Active Problems:   Hyperglycemia due to type 2 diabetes mellitus (HCC)   Thrombocytopenia (HCC)   Accelerated hypertension  PLAN   1). COPD with acute bronchitis - Patient presented with cough and shortness of breath that is consistent with acute bronchitis. He has a history of smoking and chest x-ray shows hyperinflation is consistent with COPD. - Patient has been started on DuoNeb, incentive spirometry, supplementary oxygen, Solu-Medrol and azithromycin. - Due to congestion, start patient on Claritin and [place] nasal spray.  2). Accelerated hypertension - Patient presented with a blood pressure of 219/92 and currently 24/92. He states that he is compliant with his medications. - At this time, we'll continue home medications - hydralazine, lisinopril and verapamil and bridged with IV hydralazine when necessary for elevated blood pressure. - Continue to monitor blood pressure closely  3). Hyperglycemia due to type 2 diabetes mellitus - Patient presented with a glucose of 492. He is compliant with his insulin and glipizide. - Patient has been given 10 units of insulin and will continue on moderate sliding scale as well as glipizide. - Check A1c and continue on consistent carbohydrate diet - Monitor Accu-Cheks and adjust insulin dose per results.  4). Thrombocytopenia - Patient's platelet count is 1.2. This appears to be chronic. Continue to monitor platelet count while patient is on heparin.  All the records are reviewed and case discussed with ED provider. Management plans discussed with the patient,  family and they are in agreement.  CODE STATUS: Code Status History    Date Active Date Inactive Code Status Order ID Comments User Context   09/02/2015  6:58 PM 09/05/2015  3:42 PM Full Code [id number]  Epifanio Lesches, MD ED      I have independently reviewed all EKG and chest x-ray data  VTE prophylaxis: Heparin if no contraindications and patient at low risk for bleeding. SCD's and progressive ambulation if patient has contraindications to anticoagulants. No DVT prophylaxis if patient presently receiving therapeutic anticoagulation or is at significant risk of bleeding for which the risk of anticoagulation outweigh the potential benefits.  Vaccinations: Pneumonia & flu vaccine per hospital protocol Prevention: Will proceed with conservative measures for the prevention of delirium in patients older than 65. Fall precautions and 1:1 sitter as needed per hospital protocol.  TOTAL TIME TAKING CARE OF THIS PATIENT: 45 minutes.    Sylvan Cheese M.D on 02/05/2016 at 2:03 AM  Between 7am to 6pm - Pager - [phone]  After 6pm go to [email or web address] - password [email or web address] [facility]  Hospitalists  Office  [phone]  CC: Primary care physician; Viviana Simpler, MD

## 2016-02-05 NOTE — Care Management Important Message (Signed)
Important Message  Patient Details  Name: Evan [name] MRN: [id number] Date of Birth: 06/15/1918   Medicare Important Message Given:  Yes    Chapman FitchBOWEN, Gayl Ivanoff T, RN 02/05/2016, 1:44 PM

## 2016-02-06 LAB — GLUCOSE, CAPILLARY
GLUCOSE-CAPILLARY: 328 mg/dL — AB (ref 65–99)
Glucose-Capillary: 244 mg/dL — ABNORMAL HIGH (ref 65–99)

## 2016-02-06 MED ORDER — OSELTAMIVIR PHOSPHATE 30 MG PO CAPS
30.0000 mg | ORAL_CAPSULE | Freq: Every day | ORAL | Status: DC
  Administered 2016-02-06: 30 mg via ORAL
  Filled 2016-02-06: qty 1

## 2016-02-06 MED ORDER — OSELTAMIVIR PHOSPHATE 30 MG PO CAPS: 30.0000 mg | ORAL_CAPSULE | Freq: Every day | ORAL | Status: DC

## 2016-02-06 NOTE — Progress Notes (Signed)
Pt stable. IV removed. D/c instructions given and education provided. Prescriptions verified and given. Pt states he understands instructions. Pt dressed and escorted out by staff. Driven home by family.  

## 2016-02-08 NOTE — Discharge Summary (Signed)
[facility] [place] NAME: Evan [name]    MR#:  [id number]  [place]:  [birth year]  DATE OF ADMISSION:  02/04/2016 ADMITTING PHYSICIAN: Saundra Shelling, MD  DATE OF DISCHARGE: 02/06/2016  2:00 PM  PRIMARY CARE PHYSICIAN: Viviana Simpler, MD   ADMISSION DIAGNOSIS:  Shortness of breath [R06.02]  DISCHARGE DIAGNOSIS:  Principal Problem:   COPD with acute bronchitis ([place]) Active Problems:   Hyperglycemia due to type 2 diabetes mellitus ([place])   Thrombocytopenia (HCC)   Accelerated hypertension   SECONDARY DIAGNOSIS:   Past Medical History  Diagnosis Date  . CAD (coronary artery disease)   . Diabetes mellitus   . GERD (gastroesophageal reflux disease)   . Hyperlipidemia   . Arthritis   . Hypertension   . BPH (benign prostatic hypertrophy)   . Degenerative disc disease   . Polymyalgia rheumatica ([place])   . CVA (cerebral vascular accident) ([place])   . Gastroenteritis   . Chronic kidney disease, stage III (moderate)      ADMITTING HISTORY  HISTORY OF PRESENT ILLNESS: Evan [name] is a 82 y.o. male with a known history of Coronary artery disease, diabetes mellitus, GERD, hypertension, hyperlipidemia, polymyalgia rheumatica, CVA, BPH and chronic kidney disease stage III. He presents to the ED today due to a 4 day history of worsening cough. Patient states that about 4 days ago, he developed some congestion in his chest and no stools and subsequently started having some cough. Cough is nonproductive and is occasionally associated with chest pain. Patient denies any fever but states that he has been having chills. His voice has become hoarse for the last 4 days. He denies any recent travel or contact with sick persons. He also denies any nausea or vomiting. He denies any palpitations, diaphoresis, abdominal pain, changes in bowel or urine habits or changes in appetite. He has chronic lower extremity swelling and that's unchanged. He  had occasional wheezing but denies any PND, orthopnea or neck distention. He denies any generalized weakness as well. Patient complains that he is blood glucose has been increasing in the last 24 hours and his morning glucose was 277 compared to his usual mid 100s. He does not drink alcohol, smoke cigarettes or use recreational drugs. He is compliant with his medications.  On arrival in the ED, blood pressure was 219/92 and rest of the vital signs are stable. Last A1c was 7.4 in December 2016. CMP was significant for a sodium of 133 and a BUN of 38 with creatinine of 1.76. Glucose was elevated at 494. CBC was only significant for a platelet count of 120. Chest x-ray showed perihilar reticular opacities bilaterally suggestive of interstitial disease as well as hyperinflation consistent with COPD. Troponin was negative. EKG showed normal sinus rhythm. Patient received DuoNeb's in the ED and will be admitted due to COPD with acute bronchitis as well as accelerated hypertension.  HOSPITAL COURSE:   * Acute bronchitis with influenza B Patient was treated with Tamiflu, steroids and nebulizers during the hospital stay. Improved well and on the day of discharge she is afebrile and not needing oxygen. No wheezing on examination and feels back to normal.  * Accelerated hypertension Continue home medications and increased doses on the hydralazine with dig blood pressure improved. He has had elevated blood pressure reviewing his primary care physician's office notes from recent past.  * Uncontrolled diabetes mellitus Due to acute illness and IV steroids. Patient's home medications are being continued. He  is on pre meal insulin which will be continued.  Stable for discharge home to follow-up with primary care physician.   CONSULTS OBTAINED:     DRUG ALLERGIES:   Allergies  Allergen Reactions  . Penicillins Rash and Other (See Comments)    Has patient had a PCN reaction causing immediate rash,  facial/tongue/throat swelling, SOB or lightheadedness with hypotension: No Has patient had a PCN reaction causing severe rash involving mucus membranes or skin necrosis: No Has patient had a PCN reaction that required hospitalization No Has patient had a PCN reaction occurring within the last 10 years: No If all of the above answers are "NO", then may proceed with Cephalosporin use.     DISCHARGE MEDICATIONS:   Discharge Medication List as of 02/06/2016 11:11 AM    START taking these medications   Details  azithromycin (ZITHROMAX) 250 MG tablet Take 1 tablet (250 mg total) by mouth daily., Starting 02/05/2016, Until Discontinued, Print    oseltamivir (TAMIFLU) 75 MG capsule Take 1 capsule (75 mg total) by mouth daily., Starting 02/06/2016, Until Discontinued, Print    !! predniSONE (DELTASONE) 20 MG tablet Take 2 tablets (40 mg total) by mouth daily with breakfast., Starting 02/06/2016, Until Discontinued, Print     !! - Potential duplicate medications found. Please discuss with provider.    CONTINUE these medications which have CHANGED   Details  hydrALAZINE (APRESOLINE) 100 MG tablet Take 1 tablet (100 mg total) by mouth 4 (four) times daily., Starting 02/05/2016, Until Discontinued, Normal      CONTINUE these medications which have NOT CHANGED   Details  aspirin EC 81 MG tablet Take 81 mg by mouth daily., Until Discontinued, Historical Med    Blood Glucose Monitoring Suppl (ONE TOUCH ULTRA 2) w/Device KIT 1 Device by Does not apply route once. Diagnosis: Diabetes Type 2 DX Code: E11.40, Starting 01/14/2016, Normal    brimonidine-timolol (COMBIGAN) 0.2-0.5 % ophthalmic solution Place 1 drop into both eyes 2 (two) times daily., Until Discontinued, Historical Med    fluticasone (FLONASE) 50 MCG/ACT nasal spray Place 1 spray into both nostrils daily., Until Discontinued, Historical Med    !! gabapentin (NEURONTIN) 100 MG capsule Take 1 capsule (100 mg total) by mouth daily after breakfast.,  Starting 09/05/2015, Until Discontinued, Print    !! gabapentin (NEURONTIN) 100 MG capsule Take 2 capsules (200 mg total) by mouth at bedtime., Starting 09/05/2015, Until Discontinued, Print    glipiZIDE (GLUCOTROL XL) 2.5 MG 24 hr tablet Take 1 tablet (2.5 mg total) by mouth daily., Starting 10/19/2015, Until Discontinued, Normal    GLUCERNA (GLUCERNA) LIQD Take 237 mLs by mouth 2 (two) times daily between meals., Until Discontinued, Historical Med    glucose blood (ONE TOUCH TEST STRIPS) test strip Test blood sugar 3 times a day., Normal    insulin lispro (HUMALOG) 100 UNIT/ML injection Inject 2 Units into the skin 3 (three) times daily before meals. Pt uses per sliding scale., Until Discontinued, Historical Med    lisinopril (PRINIVIL,ZESTRIL) 5 MG tablet TAKE 1 TABLET DAILY., Normal    nitroGLYCERIN (NITROSTAT) 0.4 MG SL tablet Place 0.4 mg under the tongue every 5 (five) minutes as needed for chest pain. , Until Discontinued, Historical Med    omeprazole (PRILOSEC) 40 MG capsule Take 40 mg by mouth daily., Until Discontinued, Historical Med    ONETOUCH DELICA LANCETS 29B MISC 1 strip by Does not apply route 3 (three) times daily with meals. Diagnosis: Diabetes Type 2 DX Code: E11.40, Starting 01/14/2016,  Until Discontinued, Normal    polyethylene glycol (MIRALAX / GLYCOLAX) packet Take 17 g by mouth daily. Every other day, Until Discontinued, Historical Med    pravastatin (PRAVACHOL) 40 MG tablet Take 40 mg by mouth at bedtime., Until Discontinued, Historical Med    !! predniSONE (DELTASONE) 10 MG tablet Take 1 tablet (10 mg total) by mouth daily., Normal    tamsulosin (FLOMAX) 0.4 MG CAPS capsule Take 1 capsule (0.4 mg total) by mouth daily., Starting 11/04/2015, Until Discontinued, Normal    timolol (TIMOPTIC) 0.5 % ophthalmic solution Place 1 drop into both eyes daily., Until Discontinued, Historical Med    traMADol (ULTRAM) 50 MG tablet TAKE (1) TABLET THREE TIMES DAILY AS NEEDED  FOR PAIN., Phone In    verapamil (CALAN) 40 MG tablet TAKE 1 TABLET TWICE A DAY, Normal     !! - Potential duplicate medications found. Please discuss with provider.      Today   VITAL SIGNS:  Blood pressure 154/69, pulse 59, temperature 97.5 F (36.4 C), temperature source Oral, resp. rate 20, height 6\' 3"  (1.905 m), weight 74.39 kg (164 lb), SpO2 98 %.  I/O:  No intake or output data in the 24 hours ending 02/08/16 1512  PHYSICAL EXAMINATION:  Physical Exam  GENERAL:  82 y.o.-year-old patient lying in the bed with no acute distress.  LUNGS: Normal breath sounds bilaterally, no wheezing, rales,rhonchi or crepitation. No use of accessory muscles of respiration.  CARDIOVASCULAR: S1, S2 normal. No murmurs, rubs, or gallops.  ABDOMEN: Soft, non-tender, non-distended. Bowel sounds present. No organomegaly or mass.  NEUROLOGIC: Moves all 4 extremities. PSYCHIATRIC: The patient is alert and oriented x 3.  SKIN: No obvious rash, lesion, or ulcer.   DATA REVIEW:   CBC  Recent Labs Lab 02/05/16 0700  WBC 8.0  HGB 11.8*  HCT 36.4*  PLT 114*    Chemistries   Recent Labs Lab 02/05/16 0700  NA 137  K 4.5  CL 106  CO2 24  GLUCOSE 339*  BUN 35*  CREATININE 1.65*  CALCIUM 8.3*    Cardiac Enzymes  Recent Labs Lab 02/04/16 1959  TROPONINI <0.03    Microbiology Results  Results for orders placed or performed during the hospital encounter of 09/02/15  Urine culture     Status: None   Collection Time: 09/02/15  5:11 PM  Result Value Ref Range Status   Specimen Description URINE, CLEAN CATCH  Final   Special Requests NONE  Final   Culture INSIGNIFICANT GROWTH  Final   Report Status 09/04/2015 FINAL  Final    RADIOLOGY:  No results found.  Follow up with PCP in 1 week.  Management plans discussed with the patient, family and they are in agreement.  CODE STATUS:  Code Status History    Date Active Date Inactive Code Status Order ID Comments User Context    02/05/2016  5:41 AM 02/06/2016  5:07 PM Full Code 04/07/2016  [id number], MD Inpatient   09/02/2015  6:58 PM 09/05/2015  3:42 PM Full Code 09/07/2015  [id number], MD ED      TOTAL TIME TAKING CARE OF THIS PATIENT ON DAY OF DISCHARGE: more than 30 minutes.   Katha Hamming R M.D on 02/08/2016 at 3:12 PM  Between 7am to 6pm - Pager - [phone]  After 6pm go to [email or web address] - password [email or web address] [facility]  [place]  Office  [phone]  CC: Primary care physician; [phone], MD  Note: This dictation was prepared with  Dragon dictation along with smaller Company secretary. Any transcriptional errors that result from this process are unintentional.

## 2016-02-09 ENCOUNTER — Telehealth: Payer: Self-pay | Admitting: *Deleted

## 2016-02-09 NOTE — Telephone Encounter (Signed)
Transition Care Management Follow-up Telephone Call   Date discharged? 02/06/16   How have you been since you were released from the hospital? Improving, doing well   Do you understand why you were in the hospital? yes   Do you understand the discharge instructions? yes   Where were you discharged to? home   Items Reviewed:  Medications reviewed: yes  Allergies reviewed: yes  Dietary changes reviewed: no  Referrals reviewed: none   Functional Questionnaire:   Activities of Daily Living (ADLs):   He states they are independent in the following: ambulation, bathing and hygiene, feeding, continence, grooming, toileting and dressing States they require assistance with the following: none   Any transportation issues/concerns?: no   Any patient concerns? Medication changes - will bring all new meds to follow up   Confirmed importance and date/time of follow-up visits scheduled yes, 4/10/174 @ 1530  Provider Appointment booked with Tillman Abideichard Letvak, MD  Confirmed with patient if condition begins to worsen call PCP or go to the ER.  Patient was given the office number and encouraged to call back with question or concerns.  : yes

## 2016-02-09 NOTE — Telephone Encounter (Signed)
Transitional care call attempted.  Left message for patient to return call. 

## 2016-02-15 ENCOUNTER — Ambulatory Visit (INDEPENDENT_AMBULATORY_CARE_PROVIDER_SITE_OTHER): Payer: Medicare Other | Admitting: Internal Medicine

## 2016-02-15 ENCOUNTER — Encounter: Payer: Self-pay | Admitting: Internal Medicine

## 2016-02-15 VITALS — BP 130/56 | HR 58 | Temp 98.0°F | Wt 136.0 lb

## 2016-02-15 DIAGNOSIS — M353 Polymyalgia rheumatica: Secondary | ICD-10-CM

## 2016-02-15 DIAGNOSIS — J209 Acute bronchitis, unspecified: Secondary | ICD-10-CM

## 2016-02-15 DIAGNOSIS — J440 Chronic obstructive pulmonary disease with acute lower respiratory infection: Secondary | ICD-10-CM | POA: Diagnosis not present

## 2016-02-15 DIAGNOSIS — E441 Mild protein-calorie malnutrition: Secondary | ICD-10-CM | POA: Diagnosis not present

## 2016-02-15 DIAGNOSIS — IMO0002 Reserved for concepts with insufficient information to code with codable children: Secondary | ICD-10-CM

## 2016-02-15 DIAGNOSIS — E1165 Type 2 diabetes mellitus with hyperglycemia: Secondary | ICD-10-CM

## 2016-02-15 DIAGNOSIS — E1140 Type 2 diabetes mellitus with diabetic neuropathy, unspecified: Secondary | ICD-10-CM | POA: Diagnosis not present

## 2016-02-15 MED ORDER — GLIPIZIDE 5 MG PO TABS: 5.0000 mg | ORAL_TABLET | Freq: Every day | ORAL | Status: DC

## 2016-02-15 NOTE — Assessment & Plan Note (Signed)
Discussed prednisone wean back to his usual 10mg  daily

## 2016-02-15 NOTE — Progress Notes (Signed)
Subjective:    Patient ID: Evan [name], male    DOB: [birth year], 82 y.o.   MRN: [id number]  HPI Here with [name] caregiver For hospital follow up  Reviewed [facility] records Had pneumonia--- probably from the flu (was positive) Given empiric antibiotics as well--done with both of these Still on prednisone--but wasn't sure of dose (since on chronically) Only occasional cough now--feels the mucus is breaking up Breathing is pretty good  Has aide from [place]-- 2 hours per day. Trying to get more  [name] fills in other times  Sugars high Still only using lispro if premeal over 250 No hypoglycemia Legs still numb--not really painful  Current Outpatient Prescriptions on File Prior to Visit  Medication Sig Dispense Refill  . aspirin EC 81 MG tablet Take 81 mg by mouth daily.    . Blood Glucose Monitoring Suppl (ONE TOUCH ULTRA 2) w/Device KIT 1 Device by Does not apply route once. Diagnosis: Diabetes Type 2 DX Code: E11.40 1 each 0  . brimonidine-timolol (COMBIGAN) 0.2-0.5 % ophthalmic solution Place 1 drop into both eyes 2 (two) times daily.    . fluticasone (FLONASE) 50 MCG/ACT nasal spray Place 1 spray into both nostrils daily.    Marland Kitchen gabapentin (NEURONTIN) 100 MG capsule Take 1 capsule (100 mg total) by mouth daily after breakfast. 30 capsule 1  . gabapentin (NEURONTIN) 100 MG capsule Take 2 capsules (200 mg total) by mouth at bedtime. 30 capsule 1  . glipiZIDE (GLUCOTROL XL) 2.5 MG 24 hr tablet Take 1 tablet (2.5 mg total) by mouth daily. 30 tablet 11  . GLUCERNA (GLUCERNA) LIQD Take 237 mLs by mouth 2 (two) times daily between meals.    Marland Kitchen glucose blood (ONE TOUCH TEST STRIPS) test strip Test blood sugar 3 times a day. 100 each 5  . hydrALAZINE (APRESOLINE) 100 MG tablet Take 1 tablet (100 mg total) by mouth 4 (four) times daily. 120 tablet 0  . insulin lispro (HUMALOG) 100 UNIT/ML injection Inject 2 Units into the skin 3 (three) times daily before meals. Pt uses per sliding scale.    Marland Kitchen  lisinopril (PRINIVIL,ZESTRIL) 5 MG tablet TAKE 1 TABLET DAILY. 30 tablet 0  . nitroGLYCERIN (NITROSTAT) 0.4 MG SL tablet Place 0.4 mg under the tongue every 5 (five) minutes as needed for chest pain.     Marland Kitchen omeprazole (PRILOSEC) 40 MG capsule Take 40 mg by mouth daily.    [name] DELICA LANCETS 31V MISC 1 strip by Does not apply route 3 (three) times daily with meals. Diagnosis: Diabetes Type 2 DX Code: E11.40 100 each 5  . polyethylene glycol (MIRALAX / GLYCOLAX) packet Take 17 g by mouth daily. Every other day    . pravastatin (PRAVACHOL) 40 MG tablet Take 40 mg by mouth at bedtime.    . predniSONE (DELTASONE) 10 MG tablet Take 1 tablet (10 mg total) by mouth daily. 100 tablet 1  . tamsulosin (FLOMAX) 0.4 MG CAPS capsule Take 1 capsule (0.4 mg total) by mouth daily. 30 capsule 11  . timolol (TIMOPTIC) 0.5 % ophthalmic solution Place 1 drop into both eyes daily.    . traMADol (ULTRAM) 50 MG tablet TAKE (1) TABLET THREE TIMES DAILY AS NEEDED FOR PAIN. 30 tablet 0  . verapamil (CALAN) 40 MG tablet TAKE 1 TABLET TWICE A DAY 180 tablet 3   No current facility-administered medications on file prior to visit.    Allergies  Allergen Reactions  . Penicillins Rash and Other (See Comments)  Has patient had a PCN reaction causing immediate rash, facial/tongue/throat swelling, SOB or lightheadedness with hypotension: No Has patient had a PCN reaction causing severe rash involving mucus membranes or skin necrosis: No Has patient had a PCN reaction that required hospitalization No Has patient had a PCN reaction occurring within the last 10 years: No If all of the above answers are "NO", then may proceed with Cephalosporin use.     Past Medical History  Diagnosis Date  . CAD (coronary artery disease)   . Diabetes mellitus   . GERD (gastroesophageal reflux disease)   . Hyperlipidemia   . Arthritis   . Hypertension   . BPH (benign prostatic hypertrophy)   . Degenerative disc disease   .  Polymyalgia rheumatica ([place])   . CVA (cerebral vascular accident) ([place])   . Gastroenteritis   . Chronic kidney disease, stage III (moderate)     Past Surgical History  Procedure Laterality Date  . Cataract extraction  1999 1992    glaucoma  . Angioplasty    . Esophageal dilation    . US echocardiography  04/2004    benign  . Esophagogastroduodenoscopy N/A 04/09/2015    Procedure: ESOPHAGOGASTRODUODENOSCOPY (EGD);  Surgeon: Josefine Class, MD;  Location[facility] ENDOSCOPY;  Service: Endoscopy;  Laterality: N/A;  . Savory dilation N/A 04/09/2015    Procedure: SAVORY DILATION;  Surgeon: Josefine Class, MD;  Location: [facility] ENDOSCOPY;  Service: Endoscopy;  Laterality: N/A;    Family History  Problem Relation Age of Onset  . Constipation Mother     Social History   Social History  . Marital Status: Married    Spouse Name: N/A  . Number of Children: N/A  . Years of Education: N/A   Occupational History  . retired    Social History Main Topics  . Smoking status: Former Research scientist (life sciences)  . Smokeless tobacco: Never Used     Comment: quit over 20 years ago  . Alcohol Use: No  . Drug Use: No  . Sexual Activity: Not on file   Other Topics Concern  . Not on file   Social History Narrative   Religion affecting care--involved with Church throughout life (deacon, etc)      Has living will    Requests son [name]  as his health care power of attorney   Requests DNR--- done 10/15/14   No tube feeds if cognitively unaware            Review of Systems Appetite is not too bad Has lost some weight though Sleep is variable--has some bad nights Bowels are okay    Objective:   Physical Exam  Constitutional: He appears well-developed. No distress.  Neck: Normal range of motion. Neck supple. No thyromegaly present.  Cardiovascular: Normal rate, regular rhythm and normal heart sounds.  Exam reveals no gallop.   No murmur heard. Frequent extra beats  Pulmonary/Chest: Effort normal. No  respiratory distress. He has no wheezes. He has no rales.  Slight rhonchi at right base--otherwise clear  Abdominal: Soft. There is no tenderness.  Musculoskeletal: He exhibits no edema.  Lymphadenopathy:    He has no cervical adenopathy.  Psychiatric: He has a normal mood and affect. His behavior is normal.          Assessment & Plan:

## 2016-02-15 NOTE — Patient Instructions (Signed)
Please take prednisone 20mg  daily for the next week, then go back down to 10mg  daily and stay at that dose. Increase the glipizide to 5mg  daily. Please start using the premeal insulin if your sugar is over 200.

## 2016-02-15 NOTE — Assessment & Plan Note (Signed)
Doing okay with low dose gabapentin Will increase glipizide and use lispro before meals sooner

## 2016-02-15 NOTE — Assessment & Plan Note (Signed)
Has lost more weight Working on eating better at this point

## 2016-02-15 NOTE — Assessment & Plan Note (Signed)
Exacerbated by influenza infection Better now Done with antibiotic/antiviral Discussed prednisone wean Trying to get more help at home

## 2016-03-03 ENCOUNTER — Telehealth: Payer: Self-pay

## 2016-03-03 MED ORDER — GABAPENTIN 100 MG PO CAPS: ORAL_CAPSULE | ORAL | Status: DC

## 2016-03-03 NOTE — Telephone Encounter (Signed)
Left message to call the office.

## 2016-03-03 NOTE — Telephone Encounter (Signed)
New rx sent to pharmacy via escript

## 2016-03-03 NOTE — Telephone Encounter (Signed)
Patient returned Shannon's call.  Please call patient back at [phone] or [phone].

## 2016-03-03 NOTE — Telephone Encounter (Signed)
Received a refill request for Gabapentin 100mg  #90. Sig is 1 cap in am after breakfast and 2 caps at bedtime. We did not fill it last and there are 2 listings in his med list for the same dose with the same directions just split up. Do you want to refill? He has an appt 03-17-16. Last OV 10-15-15

## 2016-03-03 NOTE — Telephone Encounter (Signed)
Confirm with him how he is taking it--- 100 AM, 200PM sounds good Can refill for a year after confirming

## 2016-03-03 NOTE — Telephone Encounter (Signed)
Verified with pt that he is taing 1 in the am and 2 in the pm of the 100mg . I will correct the med list and send a new rx for 1 year.

## 2016-03-17 ENCOUNTER — Ambulatory Visit (INDEPENDENT_AMBULATORY_CARE_PROVIDER_SITE_OTHER): Payer: Medicare Other | Admitting: Internal Medicine

## 2016-03-17 ENCOUNTER — Encounter: Payer: Self-pay | Admitting: Internal Medicine

## 2016-03-17 VITALS — BP 128/60 | HR 56 | Temp 97.2°F | Wt 166.0 lb

## 2016-03-17 DIAGNOSIS — E1165 Type 2 diabetes mellitus with hyperglycemia: Secondary | ICD-10-CM | POA: Diagnosis not present

## 2016-03-17 DIAGNOSIS — J440 Chronic obstructive pulmonary disease with acute lower respiratory infection: Secondary | ICD-10-CM

## 2016-03-17 DIAGNOSIS — J209 Acute bronchitis, unspecified: Secondary | ICD-10-CM

## 2016-03-17 DIAGNOSIS — E1140 Type 2 diabetes mellitus with diabetic neuropathy, unspecified: Secondary | ICD-10-CM | POA: Diagnosis not present

## 2016-03-17 DIAGNOSIS — I25119 Atherosclerotic heart disease of native coronary artery with unspecified angina pectoris: Secondary | ICD-10-CM | POA: Diagnosis not present

## 2016-03-17 DIAGNOSIS — IMO0002 Reserved for concepts with insufficient information to code with codable children: Secondary | ICD-10-CM

## 2016-03-17 MED ORDER — NITROGLYCERIN 0.4 MG SL SUBL: 0.4000 mg | SUBLINGUAL_TABLET | SUBLINGUAL | Status: DC | PRN

## 2016-03-17 NOTE — Progress Notes (Signed)
Subjective:    Patient ID: Evan [name], male    DOB: [birth year], 82 y.o.   MRN: [id number]  HPI Here again with Evan [name]--caregiver  Breathing is back to baseline Not coughing Walks in home with walker Does exercises --- program the PT taught him  Will have occasional left chest pain "I don't think it is my heart" Lasts only a few minutes No SOB and usually at rest Goes away on its own--hasn't used nitro No palpitations Stable edema--mostly at end of day. Wears support socks  Has someone in 2 hours every morning Working on PM help Has Medicalert--- but doesn't wear all the time  Sugars are some better 124 this AM Hasn't been using short acting insulin  Current Outpatient Prescriptions on File Prior to Visit  Medication Sig Dispense Refill  . aspirin EC 81 MG tablet Take 81 mg by mouth daily.    . Blood Glucose Monitoring Suppl (ONE TOUCH ULTRA 2) w/Device KIT 1 Device by Does not apply route once. Diagnosis: Diabetes Type 2 DX Code: E11.40 1 each 0  . brimonidine-timolol (COMBIGAN) 0.2-0.5 % ophthalmic solution Place 1 drop into both eyes 2 (two) times daily.    . fluticasone (FLONASE) 50 MCG/ACT nasal spray Place 1 spray into both nostrils daily.    Marland Kitchen gabapentin (NEURONTIN) 100 MG capsule Take 1 capsule in the morning and 2 capsules at bedtime. 90 capsule 11  . glipiZIDE (GLUCOTROL) 5 MG tablet Take 1 tablet (5 mg total) by mouth daily. 30 tablet 11  . GLUCERNA (GLUCERNA) LIQD Take 237 mLs by mouth 2 (two) times daily between meals.    Marland Kitchen glucose blood (ONE TOUCH TEST STRIPS) test strip Test blood sugar 3 times a day. 100 each 5  . hydrALAZINE (APRESOLINE) 100 MG tablet Take 1 tablet (100 mg total) by mouth 4 (four) times daily. 120 tablet 0  . insulin lispro (HUMALOG) 100 UNIT/ML injection Inject 2 Units into the skin 3 (three) times daily before meals. Pt uses per sliding scale.    Marland Kitchen lisinopril (PRINIVIL,ZESTRIL) 5 MG tablet TAKE 1 TABLET DAILY. 30 tablet 0  .  nitroGLYCERIN (NITROSTAT) 0.4 MG SL tablet Place 0.4 mg under the tongue every 5 (five) minutes as needed for chest pain.     Marland Kitchen omeprazole (PRILOSEC) 40 MG capsule Take 40 mg by mouth daily.    [name] DELICA LANCETS 62E MISC 1 strip by Does not apply route 3 (three) times daily with meals. Diagnosis: Diabetes Type 2 DX Code: E11.40 100 each 5  . polyethylene glycol (MIRALAX / GLYCOLAX) packet Take 17 g by mouth daily. Every other day    . pravastatin (PRAVACHOL) 40 MG tablet Take 40 mg by mouth at bedtime.    . predniSONE (DELTASONE) 10 MG tablet Take 1 tablet (10 mg total) by mouth daily. 100 tablet 1  . tamsulosin (FLOMAX) 0.4 MG CAPS capsule Take 1 capsule (0.4 mg total) by mouth daily. 30 capsule 11  . timolol (TIMOPTIC) 0.5 % ophthalmic solution Place 1 drop into both eyes daily.    . traMADol (ULTRAM) 50 MG tablet TAKE (1) TABLET THREE TIMES DAILY AS NEEDED FOR PAIN. 30 tablet 0  . verapamil (CALAN) 40 MG tablet TAKE 1 TABLET TWICE A DAY 180 tablet 3   No current facility-administered medications on file prior to visit.    Allergies  Allergen Reactions  . Penicillins Rash and Other (See Comments)    Has patient had a PCN reaction causing immediate rash,  facial/tongue/throat swelling, SOB or lightheadedness with hypotension: No Has patient had a PCN reaction causing severe rash involving mucus membranes or skin necrosis: No Has patient had a PCN reaction that required hospitalization No Has patient had a PCN reaction occurring within the last 10 years: No If all of the above answers are "NO", then may proceed with Cephalosporin use.     Past Medical History  Diagnosis Date  . CAD (coronary artery disease)   . Diabetes mellitus   . GERD (gastroesophageal reflux disease)   . Hyperlipidemia   . Arthritis   . Hypertension   . BPH (benign prostatic hypertrophy)   . Degenerative disc disease   . Polymyalgia rheumatica ([place])   . CVA (cerebral vascular accident) ([place])   .  Gastroenteritis   . Chronic kidney disease, stage III (moderate)     Past Surgical History  Procedure Laterality Date  . Cataract extraction  1999 1992    glaucoma  . Angioplasty    . Esophageal dilation    . US echocardiography  04/2004    benign  . Esophagogastroduodenoscopy N/A 04/09/2015    Procedure: ESOPHAGOGASTRODUODENOSCOPY (EGD);  Surgeon: Josefine Class, MD;  Location: [facility] ENDOSCOPY;  Service: Endoscopy;  Laterality: N/A;  . Savory dilation N/A 04/09/2015    Procedure: SAVORY DILATION;  Surgeon: Josefine Class, MD;  Location: [facility] ENDOSCOPY;  Service: Endoscopy;  Laterality: N/A;    Family History  Problem Relation Age of Onset  . Constipation Mother     Social History   Social History  . Marital Status: Married    Spouse Name: N/A  . Number of Children: N/A  . Years of Education: N/A   Occupational History  . retired    Social History Main Topics  . Smoking status: Former Research scientist (life sciences)  . Smokeless tobacco: Never Used     Comment: quit over 20 years ago  . Alcohol Use: No  . Drug Use: No  . Sexual Activity: Not on file   Other Topics Concern  . Not on file   Social History Narrative   Religion affecting care--involved with Church throughout life (deacon, etc)      Has living will    Requests son [name]  as his health care power of attorney   Requests DNR--- done 10/15/14   No tube feeds if cognitively unaware            Review of Systems Appetite is good Has gained weight back    Objective:   Physical Exam  Constitutional: No distress.  Neck: No thyromegaly present.  Cardiovascular: Regular rhythm.  Exam reveals no gallop.   No murmur heard. Slow but otherwise normal  Pulmonary/Chest: Effort normal and breath sounds normal. No respiratory distress. He has no wheezes. He has no rales.  Abdominal: Soft. There is no tenderness.  Musculoskeletal: He exhibits no edema or tenderness.  Lymphadenopathy:    He has no cervical adenopathy.           Assessment & Plan:

## 2016-03-17 NOTE — Assessment & Plan Note (Signed)
Some chest pain that is atypical Discussed using the nitro if it persists

## 2016-03-17 NOTE — Assessment & Plan Note (Signed)
This seems better Will continue current regimen

## 2016-03-17 NOTE — Assessment & Plan Note (Signed)
Seems to be some better Will recheck labs at next visit

## 2016-03-17 NOTE — Progress Notes (Signed)
Pre visit review using our clinic review tool, if applicable. No additional management support is needed unless otherwise documented below in the visit note. 

## 2016-03-26 IMAGING — US US EXTREM LOW VENOUS*L*
1 series · 14 of 24 positions shown · non-contrast
Comparison: None.

CLINICAL DATA: Left lower extremity pain and edema.

EXAM:
LEFT LOWER EXTREMITY VENOUS DOPPLER ULTRASOUND
TECHNIQUE: Gray-scale sonography with graded compression, as well as color
Doppler and duplex ultrasound, were performed to evaluate the deep
venous system from the level of the common femoral vein through the
popliteal and proximal calf veins. Spectral Doppler was utilized to
evaluate flow at rest and with distal augmentation maneuvers.

[Series 1: us extrem low venous*left* · 0.09mm/px · 14 of 29 slices shown]
[im 1/29]
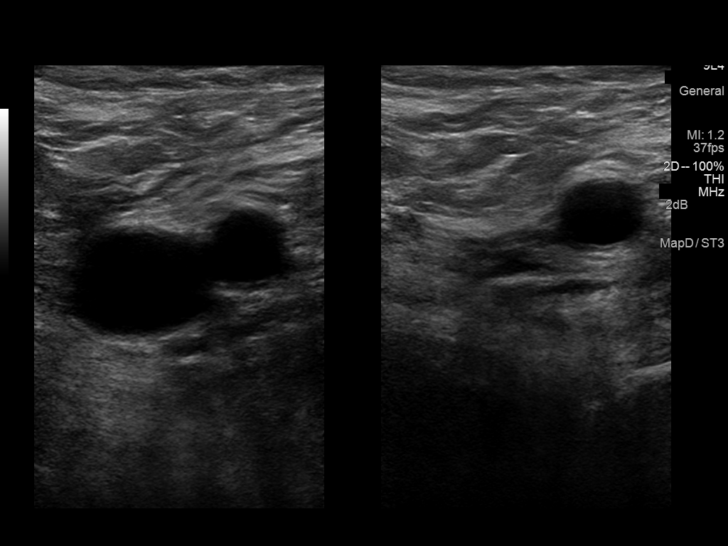
[im 3/29]
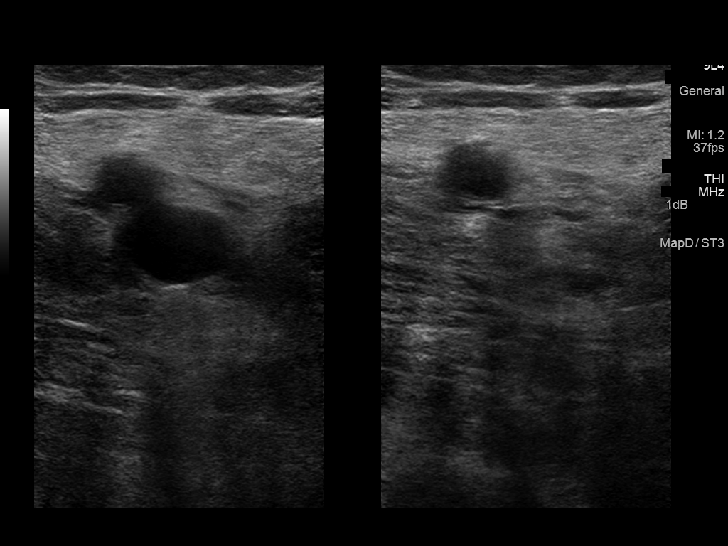
[im 5/29]
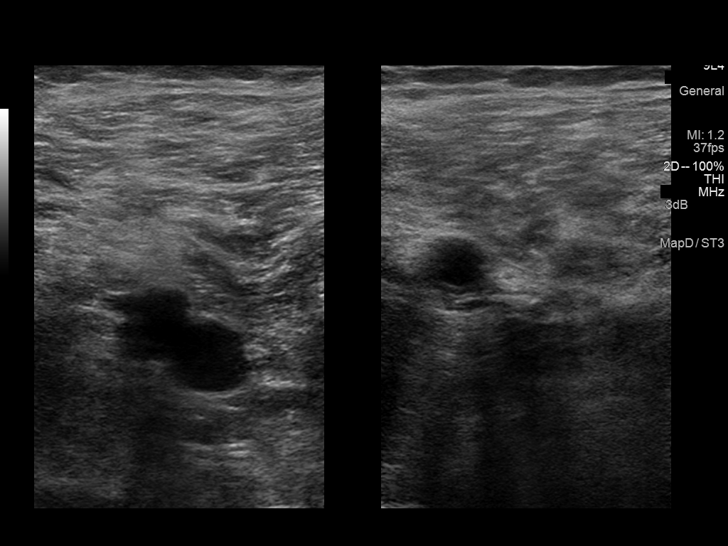
[im 8/29]
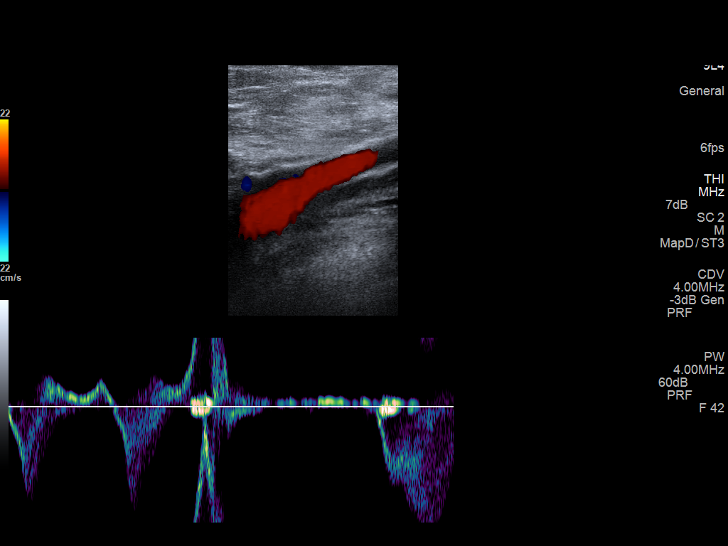
[im 9/29]
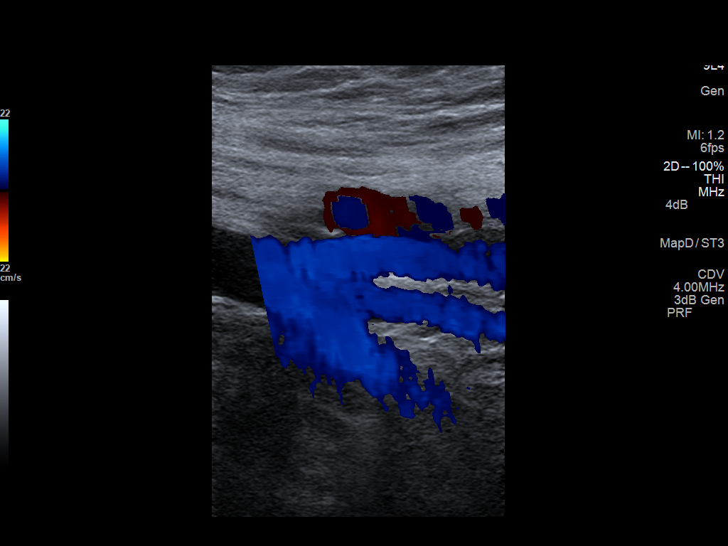
[im 11/29]
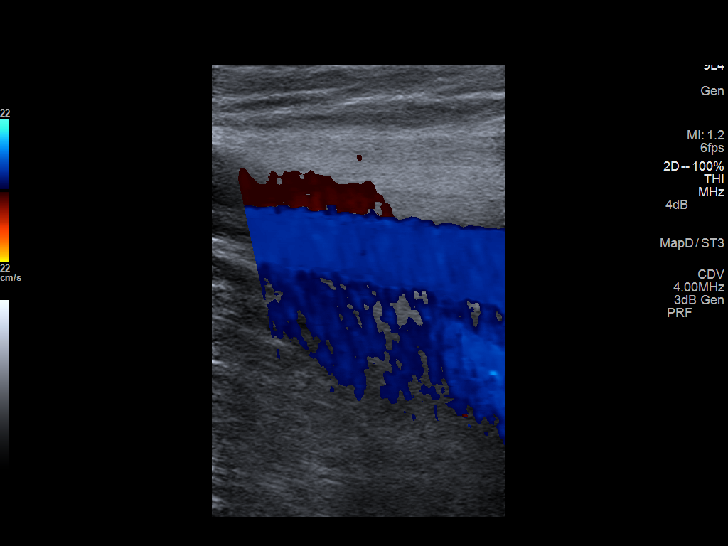
[im 14/29]
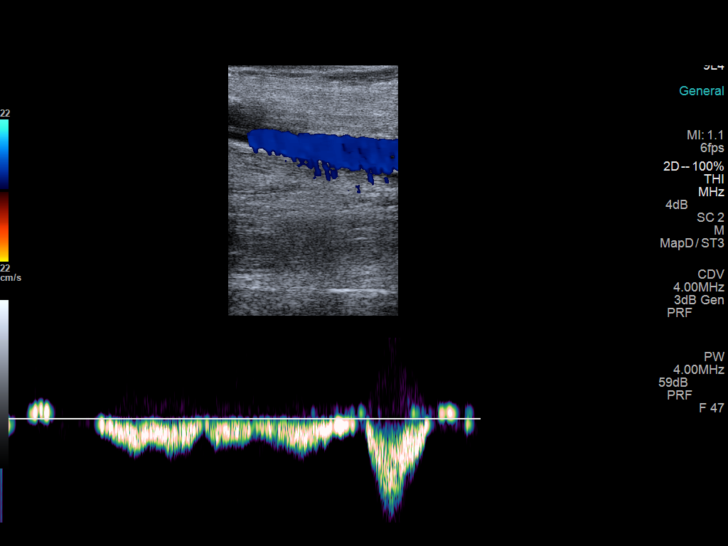
[im 15/29]
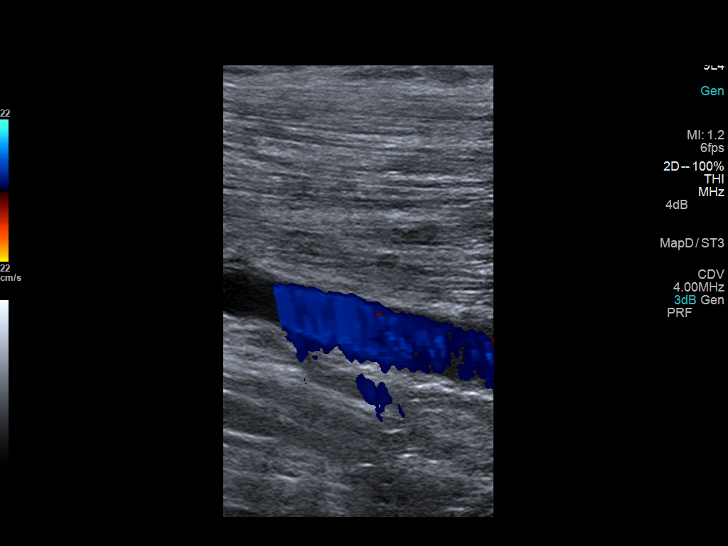
[im 18/29]
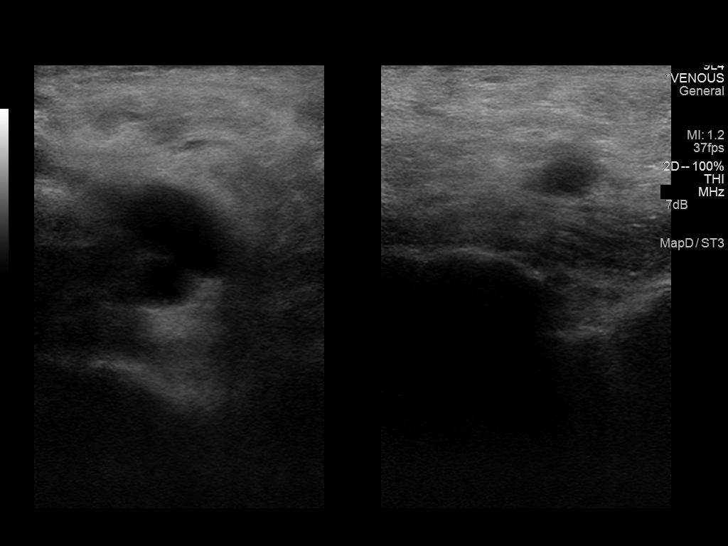
[im 20/29]
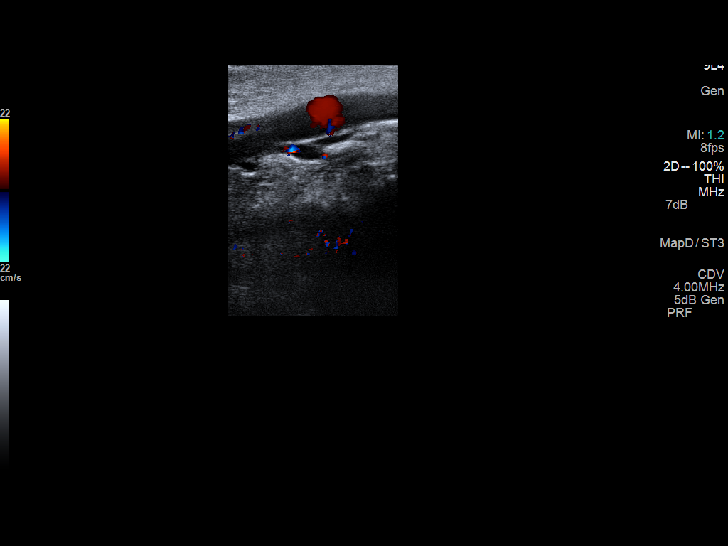
[im 22/29]
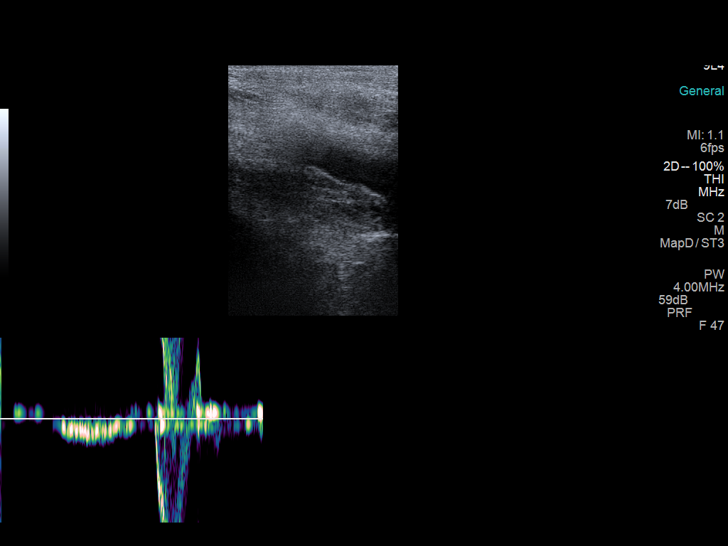
[im 24/29]
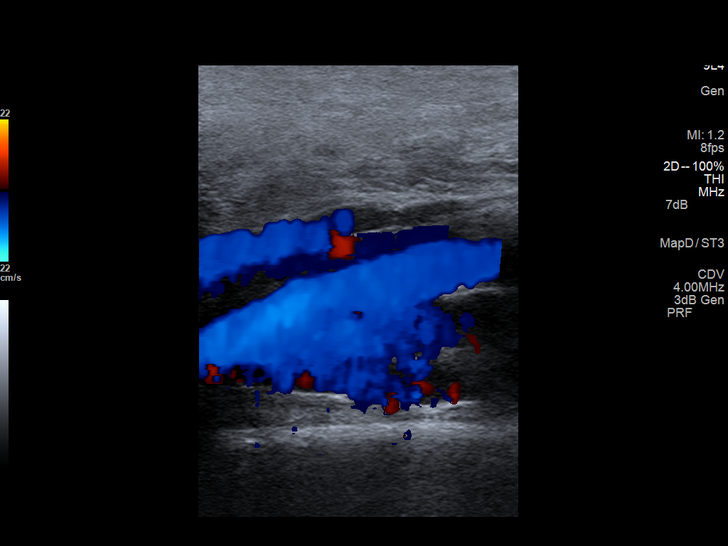
[im 26/29]
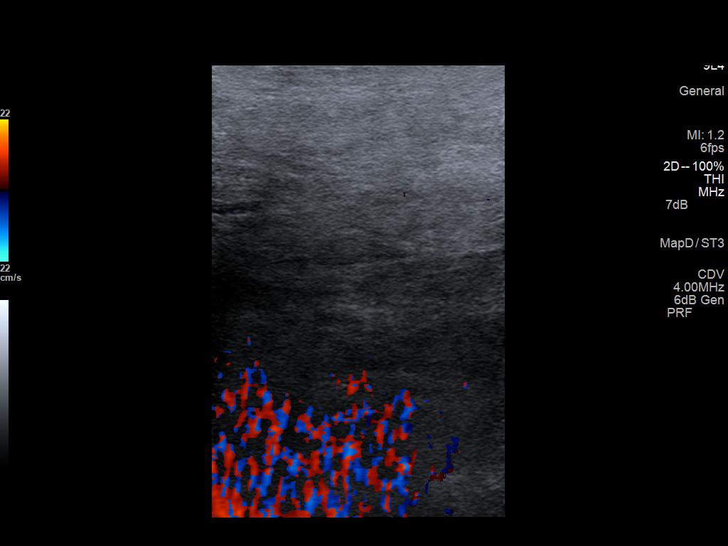
[im 29/29]
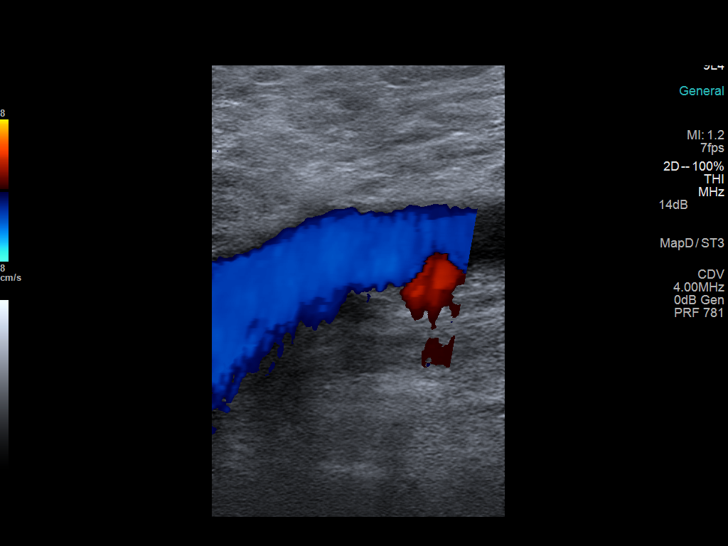

[14 of 24 positions shown; findings below may reference images not displayed]

FINDINGS: Normal compressibility, augmentation and color Doppler flow in the
left common femoral vein, left femoral vein and left popliteal vein.
Visualized deep left calf veins are patent.

The right common femoral vein is compressible and patent.
IMPRESSION: Negative for left lower extremity deep vein thrombosis.

## 2016-04-28 ENCOUNTER — Encounter: Payer: Self-pay | Admitting: Podiatry

## 2016-04-28 ENCOUNTER — Ambulatory Visit (INDEPENDENT_AMBULATORY_CARE_PROVIDER_SITE_OTHER): Payer: Medicare Other | Admitting: Podiatry

## 2016-04-28 DIAGNOSIS — M79675 Pain in left toe(s): Secondary | ICD-10-CM | POA: Diagnosis not present

## 2016-04-28 DIAGNOSIS — M79674 Pain in right toe(s): Secondary | ICD-10-CM | POA: Diagnosis not present

## 2016-04-28 DIAGNOSIS — B351 Tinea unguium: Secondary | ICD-10-CM

## 2016-05-02 ENCOUNTER — Other Ambulatory Visit: Payer: Self-pay | Admitting: Internal Medicine

## 2016-05-02 NOTE — Telephone Encounter (Signed)
Rx sent electronically.  

## 2016-05-02 NOTE — Progress Notes (Signed)
Patient ID: Evan [name], male   DOB: 03/07/1918, 82 y.o.   MRN: [id number]  Subjective: 82 year old help her since the office today for concerns of thick, discolored toenails. He states the nails are painful. Pressure in shoe gear. Denies any redness or drainage or any swelling from the toenails. No recent injury. Denies any systemic complaints such as fevers, chills, nausea, vomiting. No acute changes since last appointment, and no other complaints at this time.   Objective: AAO x3, NAD DP/PT pulses palpable bilaterally, CRT less than 3 seconds Nails are to be hypertrophic, dystrophic, brittle, discolored. The discoloration appears a yellow to brown discoloration and all the toenails. There is no discoloration into the surrounding skin. No surrounding redness or drainage. No areas of pinpoint bony tenderness or pain with vibratory sensation. MMT 5/5, ROM WNL. No edema, erythema, increase in warmth to bilateral lower extremities.  No open lesions or pre-ulcerative lesions.  No pain with calf compression, swelling, warmth, erythema  Assessment: Symptomatic onychomycosis  Plan: -All treatment options discussed with the patient including all alternatives, risks, complications.  -Discussed and the discoloration the nails is most likely from nail fungus. Discussed treatment options for nail fungus as well. He is tried cream I discussed with him formula 3 or fungi nail if you have to pursue treatment. -Nails debrided 10 without complications or bleeding -Discussed daily foot inspection -Follow-up in 3 months or sooner if needed. -Patient encouraged to call the office with any questions, concerns, change in symptoms.   Ovid CurdMatthew Isham Smitherman, DPM

## 2016-05-03 ENCOUNTER — Telehealth: Payer: Self-pay

## 2016-05-03 NOTE — Telephone Encounter (Signed)
We received a fax from [facility] for DM testing supplies. Left message to verify with pt that he wants to use them for his supplies.

## 2016-05-04 NOTE — Telephone Encounter (Signed)
Patient returned Shannon's call. °

## 2016-05-04 NOTE — Telephone Encounter (Signed)
Pt said he gets his supplies from [facility]. Said to disregard the request from [facility]. Thinks it is the same scam like the back brace a few months ago

## 2016-05-20 ENCOUNTER — Other Ambulatory Visit: Payer: Self-pay | Admitting: Internal Medicine

## 2016-05-20 NOTE — Telephone Encounter (Signed)
Last filled 03-15-16 #30 Last OV 03-17-16 Next OV 06-20-16

## 2016-05-20 NOTE — Telephone Encounter (Signed)
Rx sent electronically.  

## 2016-05-20 NOTE — Telephone Encounter (Signed)
Approved: okay x 1 year 

## 2016-05-23 NOTE — Telephone Encounter (Signed)
Verbal refill given to Kent at the pharmacy 

## 2016-05-23 NOTE — Telephone Encounter (Signed)
Last filled 01-28-16 #30 Last OV 03-17-16 Next OV 06-20-16

## 2016-05-23 NOTE — Telephone Encounter (Signed)
Approved: 30 x 0 

## 2016-06-20 ENCOUNTER — Ambulatory Visit (INDEPENDENT_AMBULATORY_CARE_PROVIDER_SITE_OTHER): Payer: Medicare Other | Admitting: Internal Medicine

## 2016-06-20 ENCOUNTER — Encounter: Payer: Self-pay | Admitting: Internal Medicine

## 2016-06-20 VITALS — BP 128/82 | HR 58 | Temp 97.5°F | Wt 160.0 lb

## 2016-06-20 DIAGNOSIS — E441 Mild protein-calorie malnutrition: Secondary | ICD-10-CM | POA: Diagnosis not present

## 2016-06-20 DIAGNOSIS — E1165 Type 2 diabetes mellitus with hyperglycemia: Secondary | ICD-10-CM

## 2016-06-20 DIAGNOSIS — E1140 Type 2 diabetes mellitus with diabetic neuropathy, unspecified: Secondary | ICD-10-CM | POA: Diagnosis not present

## 2016-06-20 DIAGNOSIS — E1122 Type 2 diabetes mellitus with diabetic chronic kidney disease: Secondary | ICD-10-CM

## 2016-06-20 DIAGNOSIS — N183 Chronic kidney disease, stage 3 (moderate): Secondary | ICD-10-CM

## 2016-06-20 DIAGNOSIS — M353 Polymyalgia rheumatica: Secondary | ICD-10-CM

## 2016-06-20 DIAGNOSIS — J439 Emphysema, unspecified: Secondary | ICD-10-CM

## 2016-06-20 DIAGNOSIS — IMO0002 Reserved for concepts with insufficient information to code with codable children: Secondary | ICD-10-CM

## 2016-06-20 DIAGNOSIS — I25119 Atherosclerotic heart disease of native coronary artery with unspecified angina pectoris: Secondary | ICD-10-CM

## 2016-06-20 LAB — HM DIABETES FOOT EXAM

## 2016-06-20 NOTE — Assessment & Plan Note (Signed)
Has lost some weight Will continue the glucerna

## 2016-06-20 NOTE — Progress Notes (Signed)
Pre visit review using our clinic review tool, if applicable. No additional management support is needed unless otherwise documented below in the visit note. 

## 2016-06-20 NOTE — Assessment & Plan Note (Signed)
Using liquid med from chiropractor--?helping hand/foot pain Hopefully sugars better Discussed using humalog before carbs

## 2016-06-20 NOTE — Assessment & Plan Note (Signed)
On ACEI  Will recheck labs 

## 2016-06-20 NOTE — Assessment & Plan Note (Signed)
Seems to be controlled on daily prednisone Will check ESR

## 2016-06-20 NOTE — Assessment & Plan Note (Signed)
Breathing is better No acute infection

## 2016-06-20 NOTE — Progress Notes (Signed)
Subjective:    Patient ID: Evan [name], male    DOB: 1918/01/25, 82 y.o.   MRN: [id number]  HPI Here for follow up of diabetes and other chronic medical conditions With Evan [name]  Seeing Evan [name] for muscle spasms and nerve pain Started him on "nitric balance"--- amino acid and herbal supplement Using a rub for the numbness in his fingers---this helps  Checks sugars daily-fasting (and occasionally later)-- mostly ~140's Rarely will take the humalog (if high-like over 170) Especially goes up with white potatoes, pasta No foot sores. Stable pain Missed eye appt--may go to the [place] for this  PMR pain is not as base Doing okay on prednisone daily  Breathing is good No regular cough--just occasionally No fevers  No recent chest pain No palpitations Gets woozy at times but no syncope or falls  Current Outpatient Prescriptions on File Prior to Visit  Medication Sig Dispense Refill  . aspirin EC 81 MG tablet Take 81 mg by mouth daily.    . Blood Glucose Monitoring Suppl (ONE TOUCH ULTRA 2) w/Device KIT 1 Device by Does not apply route once. Diagnosis: Diabetes Type 2 DX Code: E11.40 1 each 0  . brimonidine-timolol (COMBIGAN) 0.2-0.5 % ophthalmic solution Place 1 drop into both eyes 2 (two) times daily.    Marland Kitchen gabapentin (NEURONTIN) 100 MG capsule Take 1 capsule in the morning and 2 capsules at bedtime. 90 capsule 11  . glipiZIDE (GLUCOTROL) 5 MG tablet Take 1 tablet (5 mg total) by mouth daily. 30 tablet 11  . GLUCERNA (GLUCERNA) LIQD Take 237 mLs by mouth 2 (two) times daily between meals.    Marland Kitchen glucose blood (ONE TOUCH TEST STRIPS) test strip Test blood sugar 3 times a day. 100 each 5  . hydrALAZINE (APRESOLINE) 100 MG tablet Take 1 tablet (100 mg total) by mouth 4 (four) times daily. 120 tablet 0  . insulin lispro (HUMALOG) 100 UNIT/ML injection Inject 2 Units into the skin 3 (three) times daily before meals. Pt uses per sliding scale.    Marland Kitchen lisinopril (PRINIVIL,ZESTRIL)  5 MG tablet TAKE 1 TABLET DAILY. 30 tablet 0  . nitroGLYCERIN (NITROSTAT) 0.4 MG SL tablet Place 1 tablet (0.4 mg total) under the tongue every 5 (five) minutes as needed for chest pain. 25 tablet 2  . omeprazole (PRILOSEC) 40 MG capsule Take 40 mg by mouth daily.    Evan [name] DELICA LANCETS 71Q MISC 1 strip by Does not apply route 3 (three) times daily with meals. Diagnosis: Diabetes Type 2 DX Code: E11.40 100 each 5  . polyethylene glycol (MIRALAX / GLYCOLAX) packet Take 17 g by mouth daily. Every other day    . pravastatin (PRAVACHOL) 40 MG tablet Take 40 mg by mouth at bedtime.    . predniSONE (DELTASONE) 10 MG tablet Take 1 tablet (10 mg total) by mouth daily. 30 tablet 11  . tamsulosin (FLOMAX) 0.4 MG CAPS capsule Take 1 capsule (0.4 mg total) by mouth daily. 30 capsule 11  . timolol (TIMOPTIC) 0.5 % ophthalmic solution Place 1 drop into both eyes daily.    . traMADol (ULTRAM) 50 MG tablet TAKE (1) TABLET THREE TIMES DAILY AS NEEDED FOR PAIN. 30 tablet 0  . verapamil (CALAN) 40 MG tablet TAKE 1 TABLET TWICE A DAY 180 tablet 3  . fluticasone (FLONASE) 50 MCG/ACT nasal spray Place 1 spray into both nostrils daily.     No current facility-administered medications on file prior to visit.  Allergies  Allergen Reactions  . Penicillins Rash and Other (See Comments)    Has patient had a PCN reaction causing immediate rash, facial/tongue/throat swelling, SOB or lightheadedness with hypotension: No Has patient had a PCN reaction causing severe rash involving mucus membranes or skin necrosis: No Has patient had a PCN reaction that required hospitalization No Has patient had a PCN reaction occurring within the last 10 years: No If all of the above answers are "NO", then may proceed with Cephalosporin use.     Past Medical History:  Diagnosis Date  . Arthritis   . BPH (benign prostatic hypertrophy)   . CAD (coronary artery disease)   . Chronic kidney disease, stage III (moderate)   .  CVA (cerebral vascular accident) ([place])   . Degenerative disc disease   . Diabetes mellitus   . Gastroenteritis   . GERD (gastroesophageal reflux disease)   . Hyperlipidemia   . Hypertension   . Polymyalgia rheumatica ([facility])     Past Surgical History:  Procedure Laterality Date  . ANGIOPLASTY    . CATARACT EXTRACTION  1999 1992   glaucoma  . ESOPHAGEAL DILATION    . ESOPHAGOGASTRODUODENOSCOPY N/A 04/09/2015   Procedure: ESOPHAGOGASTRODUODENOSCOPY (EGD);  Surgeon: Evan Class, MD;  Location[facility] END[facility]COPY;  Service: Endoscopy;  Laterality: N/A;  . SAVORY DILATION N/A 04/09/2015   Procedure: SAVORY DILATION;  Surgeon: Evan Class, MD;  Location: [facility] ENDOSCOPY;  Service: Endoscopy;  Laterality: N/A;  . US ECHOCARDIOGRAPHY  04/2004   benign    Family History  Problem Relation Age of Onset  . Constipation Mother     Social History   Social History  . Marital status: Married    Spouse name: N/A  . Number of children: N/A  . Years of education: N/A   Occupational History  . retired    Social History Main Topics  . Smoking status: Former Research scientist (life sciences)  . Smokeless tobacco: Never Used     Comment: quit over 20 years ago  . Alcohol use No  . Drug use: No  . Sexual activity: Not on file   Other Topics Concern  . Not on file   Social History Narrative   Religion affecting care--involved with Church throughout life (deacon, etc)      Has living will    Requests son Evan [name]  as his health care power of attorney   Requests DNR--- done 10/15/14   No tube feeds if cognitively unaware            Review of Systems Voiding okay Appetite is fair Weight is down a few pounds over the past few months Sleeps in chair often--then goes to bed. Sleeps with head flat. No PND No abnormal bruising or bleeding    Objective:   Physical Exam  Constitutional: He appears well-developed and well-nourished. No distress.  Looks better than on his most recent visits  Neck: Normal  range of motion. Neck supple. No thyromegaly present.  Cardiovascular: Normal rate, regular rhythm and normal heart sounds.  Exam reveals no gallop.   No murmur heard. occ skips Faint pedal pulses  Pulmonary/Chest: Effort normal. No respiratory distress. He has no wheezes. He has no rales.  Slightly decreased breath sounds but clear  Abdominal: Soft. There is tenderness.  Musculoskeletal:  1+ ankle edema only  Lymphadenopathy:    He has no cervical adenopathy.  Neurological:  Decreased sensation in feet  Skin: No rash noted. No erythema.  No foot lesions  Assessment & Plan:

## 2016-06-20 NOTE — Assessment & Plan Note (Signed)
No recent chest pain Has the nitro just in case

## 2016-06-21 LAB — RENAL FUNCTION PANEL
Albumin: 3.7 g/dL (ref 3.5–5.2)
BUN: 35 mg/dL — AB (ref 6–23)
CALCIUM: 9.2 mg/dL (ref 8.4–10.5)
CHLORIDE: 102 meq/L (ref 96–112)
CO2: 32 meq/L (ref 19–32)
CREATININE: 1.81 mg/dL — AB (ref 0.40–1.50)
GFR: 44.92 mL/min — ABNORMAL LOW (ref >60.00)
Glucose, Bld: 298 mg/dL — ABNORMAL HIGH (ref 70–99)
PHOSPHORUS: 3.5 mg/dL (ref 2.3–4.6)
Potassium: 4.4 mEq/L (ref 3.5–5.1)
Sodium: 139 mEq/L (ref 135–145)

## 2016-06-21 LAB — LIPID PANEL
CHOL/HDL RATIO: 3
CHOLESTEROL: 148 mg/dL (ref 0–200)
HDL: 47.8 mg/dL (ref >39.00)
LDL CALC: 74 mg/dL (ref 0–99)
NonHDL: 100.61
TRIGLYCERIDES: 133 mg/dL (ref 0.0–149.0)
VLDL: 26.6 mg/dL (ref 0.0–40.0)

## 2016-06-21 LAB — SEDIMENTATION RATE: Sed Rate: 12 mm/hr (ref 0–20)

## 2016-06-21 LAB — HEMOGLOBIN A1C: Hgb A1c MFr Bld: 7.4 % — ABNORMAL HIGH (ref 4.6–6.5)

## 2016-06-22 ENCOUNTER — Telehealth: Payer: Self-pay

## 2016-06-22 NOTE — Telephone Encounter (Signed)
-----   Message from Karie Schwalbeichard I Letvak, MD sent at 06/21/2016  1:52 PM EDT ----- Please send copy Your labs look fine The diabetes control is very good again--I am not sure why it was so high in the hospital. Cholesterol levels are good Kidney function is stable No medication changes are needed. These are reassuring results!

## 2016-06-22 NOTE — Telephone Encounter (Signed)
Called patient. Gave lab results. Patient verbalized understanding.  

## 2016-06-24 ENCOUNTER — Other Ambulatory Visit: Payer: Self-pay | Admitting: Internal Medicine

## 2016-09-05 ENCOUNTER — Other Ambulatory Visit: Payer: Self-pay | Admitting: Internal Medicine

## 2016-09-20 ENCOUNTER — Telehealth: Payer: Self-pay | Admitting: Internal Medicine

## 2016-09-20 DIAGNOSIS — E1140 Type 2 diabetes mellitus with diabetic neuropathy, unspecified: Secondary | ICD-10-CM

## 2016-09-20 DIAGNOSIS — M353 Polymyalgia rheumatica: Secondary | ICD-10-CM

## 2016-09-20 DIAGNOSIS — I2510 Atherosclerotic heart disease of native coronary artery without angina pectoris: Secondary | ICD-10-CM

## 2016-09-20 NOTE — Telephone Encounter (Signed)
[employer] faxed fmla paperwork for pt son Evan [name] fmla paperwork in dr Alphonsus Siasletvak in box For review and signature

## 2016-09-21 NOTE — Telephone Encounter (Signed)
Paperwork faxed to [employer] [employer] Copy for pt Copy for scan Copy for file Copy for billing  Mailed copy to Arta BruceDwight Roat [address] [place] [phone] Per dwights request

## 2016-09-21 NOTE — Telephone Encounter (Signed)
Forms signed $20 Thanks for helping to do these

## 2016-10-13 ENCOUNTER — Telehealth: Payer: Self-pay

## 2016-10-13 NOTE — Telephone Encounter (Signed)
Delice Bisonara at Dr Col[facility]m requesting most recent visit faxed to [phone]. Done.

## 2016-11-09 ENCOUNTER — Other Ambulatory Visit: Payer: Self-pay | Admitting: Internal Medicine

## 2016-11-10 ENCOUNTER — Other Ambulatory Visit: Payer: Self-pay | Admitting: Internal Medicine

## 2016-11-10 NOTE — Telephone Encounter (Signed)
Approved: 30 x 0 

## 2016-11-10 NOTE — Telephone Encounter (Signed)
Last filled 08-13-16 #30 Last OV 06-20-16 Next OV 12-26-16

## 2016-11-10 NOTE — Telephone Encounter (Signed)
Left refill on voice mail at pharmacy  

## 2016-11-14 ENCOUNTER — Other Ambulatory Visit: Payer: Self-pay

## 2016-11-14 MED ORDER — HYDRALAZINE HCL 100 MG PO TABS: 100.0000 mg | ORAL_TABLET | Freq: Four times a day (QID) | ORAL | 0 refills | Status: DC

## 2016-11-14 NOTE — Telephone Encounter (Signed)
Rx sent electronically.  

## 2016-12-13 LAB — HM DIABETES EYE EXAM

## 2016-12-26 ENCOUNTER — Encounter: Payer: Self-pay | Admitting: Internal Medicine

## 2016-12-26 ENCOUNTER — Ambulatory Visit (INDEPENDENT_AMBULATORY_CARE_PROVIDER_SITE_OTHER): Payer: Medicare Other | Admitting: Internal Medicine

## 2016-12-26 VITALS — BP 150/82 | HR 61 | Temp 97.4°F | Ht 75.0 in | Wt 164.0 lb

## 2016-12-26 DIAGNOSIS — N183 Chronic kidney disease, stage 3 unspecified: Secondary | ICD-10-CM

## 2016-12-26 DIAGNOSIS — M353 Polymyalgia rheumatica: Secondary | ICD-10-CM | POA: Diagnosis not present

## 2016-12-26 DIAGNOSIS — E441 Mild protein-calorie malnutrition: Secondary | ICD-10-CM

## 2016-12-26 DIAGNOSIS — D696 Thrombocytopenia, unspecified: Secondary | ICD-10-CM | POA: Diagnosis not present

## 2016-12-26 DIAGNOSIS — E1142 Type 2 diabetes mellitus with diabetic polyneuropathy: Secondary | ICD-10-CM | POA: Diagnosis not present

## 2016-12-26 DIAGNOSIS — Z794 Long term (current) use of insulin: Secondary | ICD-10-CM

## 2016-12-26 DIAGNOSIS — Z7189 Other specified counseling: Secondary | ICD-10-CM | POA: Diagnosis not present

## 2016-12-26 DIAGNOSIS — I25119 Atherosclerotic heart disease of native coronary artery with unspecified angina pectoris: Secondary | ICD-10-CM

## 2016-12-26 DIAGNOSIS — Z23 Encounter for immunization: Secondary | ICD-10-CM

## 2016-12-26 DIAGNOSIS — Z0000 Encounter for general adult medical examination without abnormal findings: Secondary | ICD-10-CM

## 2016-12-26 DIAGNOSIS — E1122 Type 2 diabetes mellitus with diabetic chronic kidney disease: Secondary | ICD-10-CM | POA: Diagnosis not present

## 2016-12-26 DIAGNOSIS — J439 Emphysema, unspecified: Secondary | ICD-10-CM | POA: Diagnosis not present

## 2016-12-26 LAB — HM DIABETES FOOT EXAM

## 2016-12-26 MED ORDER — OMEPRAZOLE 40 MG PO CPDR: 40.0000 mg | DELAYED_RELEASE_CAPSULE | Freq: Every day | ORAL | 3 refills | Status: DC

## 2016-12-26 MED ORDER — LISINOPRIL 10 MG PO TABS: 10.0000 mg | ORAL_TABLET | Freq: Every day | ORAL | 3 refills | Status: DC

## 2016-12-26 NOTE — Assessment & Plan Note (Signed)
No recent exacerbation since hospitalization last year

## 2016-12-26 NOTE — Addendum Note (Signed)
Addended by: Eual FinesBRIDGES, Raha Tennison P on: 12/26/2016 05:23 PM   Modules accepted: Orders

## 2016-12-26 NOTE — Assessment & Plan Note (Signed)
BP Readings from Last 3 Encounters:  12/26/16 (!) 150/82  06/20/16 128/82  03/17/16 128/60   BP up today--generally not Will increase the lisinopril (but watch for orthostatic symptoms) Diabetes has been okay---will recheck A1c (despite high sugars in AM)

## 2016-12-26 NOTE — Progress Notes (Signed)
Subjective:    Patient ID: Evan [name], male    DOB: 1918/07/31, 83 y.o.   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 [facility] 2 hours per day, 7 days per week. Some cooking, help with bathing and cleaning. He can generally get dressed (but trouble with numbness in hands). Independent with bathroom--occasional urine incontinence.  Doing "fair" Notes that the nurse at [facility] is concerned about his blood pressure is too high They request increase in lisinopril (from Dr Hall--nephrologist) Reviewed his numbers Has been high at the [place] and he gets quite elevated numbers-- 146/74 - 202/76 Mostly [id number] systolic No headaches No recent chest pain Gets the same DOE with minor activity--not any worse  [facility] due to [facility] They did want to do the laser but have held off Was there for diabetic exam 2/6-no retinopathy  Sugars have been running high He checks them fasting--- 118- over 300  Average in high 100's No hypoglycemic reactions Feet are not as numb or painful with Rx from Dr Birdie Sons (chiropractic) Walking better now  PMR seems to be quiet No recent myalgias and weakness is better Continues on the daily prednisone  COPD has been quiet Feels it is better since he had procedure on esophagus and can swallow better No regular cough  Current Outpatient Prescriptions on File Prior to Visit  Medication Sig Dispense Refill  . aspirin EC 81 MG tablet Take 81 mg by mouth daily.    . Blood  Glucose Monitoring Suppl (ONE TOUCH ULTRA 2) w/Device KIT 1 Device by Does not apply route once. Diagnosis: Diabetes Type 2 DX Code: E11.40 1 each 0  . brimonidine-timolol (COMBIGAN) 0.2-0.5 % ophthalmic solution Place 1 drop into both eyes 2 (two) times daily.    . fluticasone (FLONASE) 50 MCG/ACT nasal spray Place 1 spray into both nostrils daily.    Marland Kitchen gabapentin (NEURONTIN) 100 MG capsule Take 1 capsule in the morning and 2 capsules at bedtime. 90 capsule 11  . glipiZIDE (GLUCOTROL) 5 MG tablet Take 1 tablet (5 mg total) by mouth daily. 30 tablet 11  . GLUCERNA (GLUCERNA) LIQD Take 237 mLs by mouth 2 (two) times daily between meals.    Marland Kitchen glucose blood (ONE TOUCH TEST STRIPS) test strip Test blood sugar 3 times a day. 100 each 5  . hydrALAZINE (APRESOLINE) 100 MG tablet Take 1 tablet (100 mg total) by mouth 4 (four) times daily. 120 tablet 0  . insulin lispro (HUMALOG) 100 UNIT/ML injection Inject 2 Units into the skin 3 (three) times daily before meals. Pt uses per sliding scale.    Marland Kitchen lisinopril (PRINIVIL,ZESTRIL) 5 MG tablet TAKE 1 TABLET DAILY. 90 tablet 1  . nitroGLYCERIN (NITROSTAT) 0.4 MG SL tablet Place 1 tablet (0.4 mg total) under the tongue every 5 (five) minutes as needed for chest pain. 25 tablet 2  . omeprazole (PRILOSEC) 40 MG capsule Take 40 mg by mouth daily.    [name] DELICA LANCETS 12Y MISC 1 strip by Does not apply route 3 (  three) times daily with meals. Diagnosis: Diabetes Type 2 DX Code: E11.40 100 each 5  . polyethylene glycol (MIRALAX / GLYCOLAX) packet Take 17 g by mouth daily. Every other day    . pravastatin (PRAVACHOL) 40 MG tablet Take 1 tablet (40 mg total) by mouth daily. 30 tablet 1  . predniSONE (DELTASONE) 10 MG tablet Take 1 tablet (10 mg total) by mouth daily. 30 tablet 11  . tamsulosin (FLOMAX) 0.4 MG CAPS capsule Take 1 capsule (0.4 mg total) by mouth daily. 30 capsule 1  . timolol (TIMOPTIC) 0.5 % ophthalmic solution Place 1 drop into both eyes daily.      . traMADol (ULTRAM) 50 MG tablet TAKE  (1)  TABLET  THREE TIMES DAILY AS NEEDED FOR PAIN. 30 tablet 0  . verapamil (CALAN) 40 MG tablet TAKE 1 TABLET TWICE A DAY 180 tablet 1   No current facility-administered medications on file prior to visit.     Allergies  Allergen Reactions  . Penicillins Rash and Other (See Comments)    Has patient had a PCN reaction causing immediate rash, facial/tongue/throat swelling, SOB or lightheadedness with hypotension: No Has patient had a PCN reaction causing severe rash involving mucus membranes or skin necrosis: No Has patient had a PCN reaction that required hospitalization No Has patient had a PCN reaction occurring within the last 10 years: No If all of the above answers are "NO", then may proceed with Cephalosporin use.     Past Medical History:  Diagnosis Date  . Arthritis   . BPH (benign prostatic hypertrophy)   . CAD (coronary artery disease)   . Chronic kidney disease, stage III (moderate)   . CVA (cerebral vascular accident) ([place])   . Degenerative disc disease   . Diabetes mellitus   . Gastroenteritis   . GERD (gastroesophageal reflux disease)   . Hyperlipidemia   . Hypertension   . Polymyalgia rheumatica (HCC)     Past Surgical History:  Procedure Laterality Date  . ANGIOPLASTY    . CATARACT EXTRACTION  1999 1992   glaucoma  . ESOPHAGEAL DILATION    . ESOPHAGOGASTRODUODENOSCOPY N/A 04/09/2015   Procedure: ESOPHAGOGASTRODUODENOSCOPY (EGD);  Surgeon: Josefine Class, MD;  Location: [facility] ENDOSCOPY;  Service: Endoscopy;  Laterality: N/A;  . SAVORY DILATION N/A 04/09/2015   Procedure: SAVORY DILATION;  Surgeon: Josefine Class, MD;  Location: [facility] ENDOSCOPY;  Service: Endoscopy;  Laterality: N/A;  . US ECHOCARDIOGRAPHY  04/2004   benign    Family History  Problem Relation Age of Onset  . Constipation Mother     Social History   Social History  . Marital status: Married    Spouse name: N/A  . Number of children: N/A   . Years of education: N/A   Occupational History  . retired    Social History Main Topics  . Smoking status: Former Research scientist (life sciences)  . Smokeless tobacco: Never Used     Comment: quit over 20 years ago  . Alcohol use No  . Drug use: No  . Sexual activity: Not on file   Other Topics Concern  . Not on file   Social History Narrative   Religion affecting care--involved with Church throughout life (deacon, etc)      Has living will    Requests son [name]  as his health care power of attorney   Requests DNR--- done 10/15/14   No tube feeds if cognitively unaware            Review  of Systems Appetite is good Weight is up a little---still well below his normal. Does use glucerna Bruises very easy--- but not worse. No blood in urine or stool Teeth okay---needs work due to breakage. No bleeding  Bowels are fairly good Some ongoing knee pain--uses biofreeze No rash or suspicious skin lesions No heartburn---just mild dysphagia "right at throat"    Objective:   Physical Exam  Constitutional: He is oriented to person, place, and time. He appears well-nourished. No distress.  HENT:  Mouth/Throat: Oropharynx is clear and moist. No oropharyngeal exudate.  Neck: No thyromegaly present.  Cardiovascular: Normal rate, regular rhythm and normal heart sounds.  Exam reveals no gallop.   No murmur heard. Feet warm without palpable pulses  Pulmonary/Chest: Effort normal. No respiratory distress. He has no wheezes. He has no rales.  Slightly decreased breath sounds but clear  Abdominal: Soft. There is no tenderness.  Musculoskeletal: He exhibits no tenderness.  1+ edema in ankles only  Lymphadenopathy:    He has no cervical adenopathy.  Neurological: He is alert and oriented to person, place, and time.  President-- "Trump, ?" [phone] [id number] Recall 0/3  Very little sensation in feet  Skin: No rash noted.  No foot ulcers  Psychiatric: He has a normal mood and affect. His behavior is  normal.          Assessment & Plan:

## 2016-12-26 NOTE — Assessment & Plan Note (Signed)
Numbness especially a problems in hands but not overly painful

## 2016-12-26 NOTE — Assessment & Plan Note (Signed)
I have personally reviewed the Medicare Annual Wellness questionnaire and have noted 1. The patient's medical and social history 2. Their use of alcohol, tobacco or illicit drugs 3. Their current medications and supplements 4. The patient's functional ability including ADL's, fall risks, home safety risks and hearing or visual             impairment. 5. Diet and physical activities 6. Evidence for depression or mood disorders  The patients weight, height, BMI and visual acuity have been recorded in the chart I have made referrals, counseling and provided education to the patient based review of the above and I have provided the pt with a written personalized care plan for preventive services.  I have provided you with a copy of your personalized plan for preventive services. Please take the time to review along with your updated medication list.  Will give flu vaccine and update pneumovax No cancer screening due to age

## 2016-12-26 NOTE — Assessment & Plan Note (Signed)
As DNR

## 2016-12-26 NOTE — Progress Notes (Signed)
Pre visit review using our clinic review tool, if applicable. No additional management support is needed unless otherwise documented below in the visit note. 

## 2016-12-26 NOTE — Assessment & Plan Note (Signed)
Still with stable DOE--- anginal equivalent

## 2016-12-26 NOTE — Assessment & Plan Note (Signed)
Weight up sllightly Takes the glucerna

## 2016-12-26 NOTE — Assessment & Plan Note (Signed)
Seems to be quiet on prednisone Will recheck PMR

## 2016-12-26 NOTE — Assessment & Plan Note (Signed)
Bruises easily but no other pathologic bleeding Will recheck labs today

## 2016-12-26 NOTE — Assessment & Plan Note (Signed)
Will increase lisinopril

## 2016-12-27 ENCOUNTER — Other Ambulatory Visit: Payer: Self-pay | Admitting: Internal Medicine

## 2016-12-27 DIAGNOSIS — E1122 Type 2 diabetes mellitus with diabetic chronic kidney disease: Secondary | ICD-10-CM

## 2016-12-27 DIAGNOSIS — N183 Chronic kidney disease, stage 3 (moderate): Principal | ICD-10-CM

## 2016-12-27 LAB — CBC WITH DIFFERENTIAL/PLATELET
BASOS ABS: 0.2 10*3/uL — AB (ref 0.0–0.1)
Basophils Relative: 1.5 % (ref 0.0–3.0)
Eosinophils Absolute: 0.1 10*3/uL (ref 0.0–0.7)
Eosinophils Relative: 1.3 % (ref 0.0–5.0)
HCT: 36.5 % — ABNORMAL LOW (ref 39.0–52.0)
HEMOGLOBIN: 11.8 g/dL — AB (ref 13.0–17.0)
Lymphocytes Relative: 19.5 % (ref 12.0–46.0)
Lymphs Abs: 1.9 10*3/uL (ref 0.7–4.0)
MCHC: 32.3 g/dL (ref 30.0–36.0)
MCV: 89.6 fl (ref 78.0–100.0)
MONO ABS: 0.9 10*3/uL (ref 0.1–1.0)
Monocytes Relative: 9 % (ref 3.0–12.0)
Neutro Abs: 6.8 10*3/uL (ref 1.4–7.7)
Neutrophils Relative %: 68.7 % (ref 43.0–77.0)
Platelets: 160 10*3/uL (ref 150.0–400.0)
RBC: 4.08 Mil/uL — AB (ref 4.22–5.81)
RDW: 12.5 % (ref 11.5–15.5)
WBC: 9.9 10*3/uL (ref 4.0–10.5)

## 2016-12-27 LAB — COMPREHENSIVE METABOLIC PANEL
ALBUMIN: 3.5 g/dL (ref 3.5–5.2)
ALK PHOS: 48 U/L (ref 39–117)
ALT: 9 U/L (ref 0–53)
AST: 16 U/L (ref 0–37)
BILIRUBIN TOTAL: 0.6 mg/dL (ref 0.2–1.2)
BUN: 46 mg/dL — AB (ref 6–23)
CO2: 31 mEq/L (ref 19–32)
CREATININE: 2.26 mg/dL — AB (ref 0.40–1.50)
Calcium: 9.1 mg/dL (ref 8.4–10.5)
Chloride: 107 mEq/L (ref 96–112)
GFR: 34.73 mL/min — ABNORMAL LOW (ref >60.00)
Glucose, Bld: 133 mg/dL — ABNORMAL HIGH (ref 70–99)
Potassium: 4.8 mEq/L (ref 3.5–5.1)
SODIUM: 144 meq/L (ref 135–145)
TOTAL PROTEIN: 6.9 g/dL (ref 6.0–8.3)

## 2016-12-27 LAB — LIPID PANEL
CHOLESTEROL: 130 mg/dL (ref 0–200)
HDL: 48.5 mg/dL (ref >39.00)
LDL Cholesterol: 63 mg/dL (ref 0–99)
NonHDL: 81.46
Total CHOL/HDL Ratio: 3
Triglycerides: 93 mg/dL (ref 0.0–149.0)
VLDL: 18.6 mg/dL (ref 0.0–40.0)

## 2016-12-27 LAB — T4, FREE: Free T4: 0.84 ng/dL (ref 0.60–1.60)

## 2016-12-27 LAB — HEMOGLOBIN A1C: Hgb A1c MFr Bld: 7.8 % — ABNORMAL HIGH (ref 4.6–6.5)

## 2016-12-27 LAB — SEDIMENTATION RATE: Sed Rate: 11 mm/hr (ref 0–20)

## 2017-01-03 ENCOUNTER — Encounter: Payer: Self-pay | Admitting: *Deleted

## 2017-01-10 ENCOUNTER — Telehealth: Payer: Self-pay

## 2017-01-10 NOTE — Telephone Encounter (Signed)
Pt needs appt for re-eval to consider further increase in meds. Okay to await Dr. Karle StarchLetvak's return unless becomes symptomatic other than weakness.

## 2017-01-10 NOTE — Telephone Encounter (Signed)
Dwight pts son (no DPR for son) said that pt was seen 12/26/16 and lisinopril was increased but pts son said that is not helping BP. Pt has been taking lisinopril 10 mg; on 01/02/17 BP 175/70;  01/09/17 BP 186/84: and 01/10/17 BP 194/77. Pt only complaint is feeling weak. No H/A, dizziness, CP or SOB. [facility].Dwight request cb.

## 2017-01-11 NOTE — Telephone Encounter (Signed)
Spoke to pt and advised. F/u appt scheduled.  

## 2017-01-20 ENCOUNTER — Encounter: Payer: Self-pay | Admitting: Internal Medicine

## 2017-01-20 ENCOUNTER — Ambulatory Visit (INDEPENDENT_AMBULATORY_CARE_PROVIDER_SITE_OTHER): Payer: Medicare Other | Admitting: Internal Medicine

## 2017-01-20 VITALS — BP 118/58 | HR 61 | Temp 97.5°F | Wt 164.2 lb

## 2017-01-20 DIAGNOSIS — I10 Essential (primary) hypertension: Secondary | ICD-10-CM

## 2017-01-20 DIAGNOSIS — N183 Chronic kidney disease, stage 3 (moderate): Secondary | ICD-10-CM | POA: Diagnosis not present

## 2017-01-20 DIAGNOSIS — E1122 Type 2 diabetes mellitus with diabetic chronic kidney disease: Secondary | ICD-10-CM | POA: Diagnosis not present

## 2017-01-20 LAB — RENAL FUNCTION PANEL
ALBUMIN: 3.1 g/dL — AB (ref 3.5–5.2)
BUN: 60 mg/dL — AB (ref 6–23)
CHLORIDE: 103 meq/L (ref 96–112)
CO2: 28 meq/L (ref 19–32)
CREATININE: 2.41 mg/dL — AB (ref 0.40–1.50)
Calcium: 8.8 mg/dL (ref 8.4–10.5)
GFR: 32.24 mL/min — ABNORMAL LOW (ref >60.00)
Glucose, Bld: 289 mg/dL — ABNORMAL HIGH (ref 70–99)
Phosphorus: 3.7 mg/dL (ref 2.3–4.6)
Potassium: 4.3 mEq/L (ref 3.5–5.1)
Sodium: 136 mEq/L (ref 135–145)

## 2017-01-20 NOTE — Progress Notes (Signed)
Subjective:    Patient ID: Evan [name], male    DOB: October 03, 1918, 83 y.o.   MRN: [id number]  HPI Here due to concerns about his blood pressure Had been high at times Seems better now BP 130/66 (by home health) Had been up for a while so son wanted him checked  No recent med changes Slight chest pain and SOB--- often at rest No real change Some headache yesterday--better now  Current Outpatient Prescriptions on File Prior to Visit  Medication Sig Dispense Refill  . aspirin EC 81 MG tablet Take 81 mg by mouth daily.    . Blood Glucose Monitoring Suppl (ONE TOUCH ULTRA 2) w/Device KIT 1 Device by Does not apply route once. Diagnosis: Diabetes Type 2 DX Code: E11.40 1 each 0  . brimonidine-timolol (COMBIGAN) 0.2-0.5 % ophthalmic solution Place 1 drop into both eyes 2 (two) times daily.    . fluticasone (FLONASE) 50 MCG/ACT nasal spray Place 1 spray into both nostrils daily.    Marland Kitchen gabapentin (NEURONTIN) 100 MG capsule Take 1 capsule in the morning and 2 capsules at bedtime. 90 capsule 11  . glipiZIDE (GLUCOTROL) 5 MG tablet Take 1 tablet (5 mg total) by mouth daily. 30 tablet 11  . GLUCERNA (GLUCERNA) LIQD Take 237 mLs by mouth 2 (two) times daily between meals.    Marland Kitchen glucose blood (ONE TOUCH TEST STRIPS) test strip Test blood sugar 3 times a day. 100 each 5  . hydrALAZINE (APRESOLINE) 100 MG tablet Take 1 tablet (100 mg total) by mouth 4 (four) times daily. 120 tablet 0  . insulin lispro (HUMALOG) 100 UNIT/ML injection Inject 2 Units into the skin 3 (three) times daily before meals. Pt uses per sliding scale.    Marland Kitchen lisinopril (PRINIVIL,ZESTRIL) 10 MG tablet Take 1 tablet (10 mg total) by mouth daily. 90 tablet 3  . nitroGLYCERIN (NITROSTAT) 0.4 MG SL tablet Place 1 tablet (0.4 mg total) under the tongue every 5 (five) minutes as needed for chest pain. 25 tablet 2  . omeprazole (PRILOSEC) 40 MG capsule Take 1 capsule (40 mg total) by mouth daily. 90 capsule 3  . ONETOUCH DELICA LANCETS  19F MISC 1 strip by Does not apply route 3 (three) times daily with meals. Diagnosis: Diabetes Type 2 DX Code: E11.40 100 each 5  . polyethylene glycol (MIRALAX / GLYCOLAX) packet Take 17 g by mouth daily. Every other day    . pravastatin (PRAVACHOL) 40 MG tablet Take 1 tablet (40 mg total) by mouth daily. 30 tablet 1  . predniSONE (DELTASONE) 10 MG tablet Take 1 tablet (10 mg total) by mouth daily. 30 tablet 11  . tamsulosin (FLOMAX) 0.4 MG CAPS capsule Take 1 capsule (0.4 mg total) by mouth daily. 30 capsule 1  . timolol (TIMOPTIC) 0.5 % ophthalmic solution Place 1 drop into both eyes daily.    . traMADol (ULTRAM) 50 MG tablet TAKE  (1)  TABLET  THREE TIMES DAILY AS NEEDED FOR PAIN. 30 tablet 0  . verapamil (CALAN) 40 MG tablet TAKE 1 TABLET TWICE A DAY 180 tablet 1   No current facility-administered medications on file prior to visit.     Allergies  Allergen Reactions  . Penicillins Rash and Other (See Comments)    Has patient had a PCN reaction causing immediate rash, facial/tongue/throat swelling, SOB or lightheadedness with hypotension: No Has patient had a PCN reaction causing severe rash involving mucus membranes or skin necrosis: No Has patient had a PCN reaction that  required hospitalization No Has patient had a PCN reaction occurring within the last 10 years: No If all of the above answers are "NO", then may proceed with Cephalosporin use.     Past Medical History:  Diagnosis Date  . Arthritis   . BPH (benign prostatic hypertrophy)   . CAD (coronary artery disease)   . Chronic kidney disease, stage III (moderate)   . CVA (cerebral vascular accident) ([place])   . Degenerative disc disease   . Diabetes mellitus   . Gastroenteritis   . GERD (gastroesophageal reflux disease)   . Hyperlipidemia   . Hypertension   . Polymyalgia rheumatica (HCC)     Past Surgical History:  Procedure Laterality Date  . ANGIOPLASTY    . CATARACT EXTRACTION  1999 1992   glaucoma  .  ESOPHAGEAL DILATION    . ESOPHAGOGASTRODUODENOSCOPY N/A 04/09/2015   Procedure: ESOPHAGOGASTRODUODENOSCOPY (EGD);  Surgeon: Josefine Class, MD;  Location: [facility] ENDOSCOPY;  Service: Endoscopy;  Laterality: N/A;  . SAVORY DILATION N/A 04/09/2015   Procedure: SAVORY DILATION;  Surgeon: Josefine Class, MD;  Location: [facility] ENDOSCOPY;  Service: Endoscopy;  Laterality: N/A;  . US ECHOCARDIOGRAPHY  04/2004   benign    Family History  Problem Relation Age of Onset  . Constipation Mother     Social History   Social History  . Marital status: Married    Spouse name: N/A  . Number of children: N/A  . Years of education: N/A   Occupational History  . retired    Social History Main Topics  . Smoking status: Former Research scientist (life sciences)  . Smokeless tobacco: Never Used     Comment: quit over 20 years ago  . Alcohol use No  . Drug use: No  . Sexual activity: Not on file   Other Topics Concern  . Not on file   Social History Narrative   Religion affecting care--involved with Church throughout life (deacon, etc)      Has living will    Requests son Evan [name]  as his health care power of attorney   Requests DNR--- done 10/15/14   No tube feeds if cognitively unaware            Review of [facility] got [facility] and he went down. Numbness resolved and now just some buttock soreness Appetite is always good    Objective:   Physical Exam  Constitutional: He appears well-nourished. No distress.  Cardiovascular: Normal rate, regular rhythm and normal heart sounds.  Exam reveals no gallop.   No murmur heard. Pulmonary/Chest: Effort normal and breath sounds normal. No respiratory distress. He has no wheezes. He has no rales.  Musculoskeletal: He exhibits no edema.          Assessment & Plan:

## 2017-01-20 NOTE — Assessment & Plan Note (Signed)
BP Readings from Last 3 Encounters:  01/20/17 (!) 118/58  12/26/16 (!) 150/82  06/20/16 128/82   Highly variable 110/52 on right now Would not change meds

## 2017-01-20 NOTE — Assessment & Plan Note (Signed)
Creatinine up some last time---will recheck (only accept mild change due to increased ACEI)

## 2017-01-20 NOTE — Progress Notes (Signed)
Pre visit review using our clinic review tool, if applicable. No additional management support is needed unless otherwise documented below in the visit note. 

## 2017-01-23 ENCOUNTER — Encounter: Payer: Self-pay | Admitting: *Deleted

## 2017-01-31 ENCOUNTER — Other Ambulatory Visit: Payer: Self-pay

## 2017-01-31 ENCOUNTER — Other Ambulatory Visit: Payer: Medicare Other

## 2017-01-31 MED ORDER — LISINOPRIL 10 MG PO TABS: 10.0000 mg | ORAL_TABLET | Freq: Every day | ORAL | 3 refills | Status: DC

## 2017-01-31 MED ORDER — GLIPIZIDE 5 MG PO TABS: 5.0000 mg | ORAL_TABLET | Freq: Every day | ORAL | 3 refills | Status: AC

## 2017-01-31 MED ORDER — GABAPENTIN 100 MG PO CAPS: ORAL_CAPSULE | ORAL | 3 refills | Status: DC

## 2017-01-31 MED ORDER — PRAVASTATIN SODIUM 40 MG PO TABS: 40.0000 mg | ORAL_TABLET | Freq: Every day | ORAL | 3 refills | Status: AC

## 2017-01-31 MED ORDER — TAMSULOSIN HCL 0.4 MG PO CAPS: 0.4000 mg | ORAL_CAPSULE | Freq: Every day | ORAL | 3 refills | Status: AC

## 2017-01-31 NOTE — Telephone Encounter (Signed)
Rxs sent electronically.  

## 2017-02-17 ENCOUNTER — Encounter: Payer: Self-pay | Admitting: Internal Medicine

## 2017-02-17 ENCOUNTER — Emergency Department: Payer: Medicare Other

## 2017-02-17 ENCOUNTER — Emergency Department
Admission: EM | Admit: 2017-02-17 | Discharge: 2017-02-17 | Disposition: A | Discharge status: Home / Self Care | Payer: Medicare Other | Attending: Emergency Medicine | Admitting: Emergency Medicine

## 2017-02-17 DIAGNOSIS — Z79899 Other long term (current) drug therapy: Secondary | ICD-10-CM | POA: Insufficient documentation

## 2017-02-17 DIAGNOSIS — Z87891 Personal history of nicotine dependence: Secondary | ICD-10-CM | POA: Diagnosis not present

## 2017-02-17 DIAGNOSIS — N183 Chronic kidney disease, stage 3 (moderate): Secondary | ICD-10-CM | POA: Insufficient documentation

## 2017-02-17 DIAGNOSIS — Z794 Long term (current) use of insulin: Secondary | ICD-10-CM | POA: Diagnosis not present

## 2017-02-17 DIAGNOSIS — E1122 Type 2 diabetes mellitus with diabetic chronic kidney disease: Secondary | ICD-10-CM | POA: Insufficient documentation

## 2017-02-17 DIAGNOSIS — I2510 Atherosclerotic heart disease of native coronary artery without angina pectoris: Secondary | ICD-10-CM | POA: Diagnosis not present

## 2017-02-17 DIAGNOSIS — I700 Atherosclerosis of aorta: Secondary | ICD-10-CM | POA: Insufficient documentation

## 2017-02-17 DIAGNOSIS — I129 Hypertensive chronic kidney disease with stage 1 through stage 4 chronic kidney disease, or unspecified chronic kidney disease: Secondary | ICD-10-CM | POA: Insufficient documentation

## 2017-02-17 DIAGNOSIS — J449 Chronic obstructive pulmonary disease, unspecified: Secondary | ICD-10-CM | POA: Insufficient documentation

## 2017-02-17 DIAGNOSIS — R109 Unspecified abdominal pain: Secondary | ICD-10-CM | POA: Diagnosis not present

## 2017-02-17 LAB — PROTIME-INR
INR: 1.06
PROTHROMBIN TIME: 13.8 s (ref 11.4–15.2)

## 2017-02-17 LAB — BASIC METABOLIC PANEL
Anion gap: 6 (ref 5–15)
BUN: 43 mg/dL — AB (ref 6–20)
CO2: 28 mmol/L (ref 22–32)
Calcium: 9 mg/dL (ref 8.9–10.3)
Chloride: 107 mmol/L (ref 101–111)
Creatinine, Ser: 2.34 mg/dL — ABNORMAL HIGH (ref 0.61–1.24)
GFR calc Af Amer: 25 mL/min — ABNORMAL LOW (ref >60)
GFR, EST NON AFRICAN AMERICAN: 22 mL/min — AB (ref >60)
GLUCOSE: 114 mg/dL — AB (ref 65–99)
POTASSIUM: 4.4 mmol/L (ref 3.5–5.1)
Sodium: 141 mmol/L (ref 135–145)

## 2017-02-17 LAB — CBC
HEMATOCRIT: 37.3 % — AB (ref 40.0–52.0)
Hemoglobin: 12 g/dL — ABNORMAL LOW (ref 13.0–18.0)
MCH: 28.8 pg (ref 26.0–34.0)
MCHC: 32.2 g/dL (ref 32.0–36.0)
MCV: 89.4 fL (ref 80.0–100.0)
Platelets: 133 10*3/uL — ABNORMAL LOW (ref 150–440)
RBC: 4.18 MIL/uL — ABNORMAL LOW (ref 4.40–5.90)
RDW: 12.5 % (ref 11.5–14.5)
WBC: 10.8 10*3/uL — ABNORMAL HIGH (ref 3.8–10.6)

## 2017-02-17 LAB — LIPASE, BLOOD: Lipase: 21 U/L (ref 11–51)

## 2017-02-17 LAB — HEPATIC FUNCTION PANEL
ALK PHOS: 53 U/L (ref 38–126)
ALT: 10 U/L — ABNORMAL LOW (ref 17–63)
AST: 17 U/L (ref 15–41)
Albumin: 3 g/dL — ABNORMAL LOW (ref 3.5–5.0)
BILIRUBIN TOTAL: 1.1 mg/dL (ref 0.3–1.2)
Total Protein: 6.3 g/dL — ABNORMAL LOW (ref 6.5–8.1)

## 2017-02-17 LAB — URINALYSIS, COMPLETE (UACMP) WITH MICROSCOPIC
BACTERIA UA: NONE SEEN
Bilirubin Urine: NEGATIVE
Glucose, UA: NEGATIVE mg/dL
Hgb urine dipstick: NEGATIVE
Ketones, ur: NEGATIVE mg/dL
Leukocytes, UA: NEGATIVE
NITRITE: NEGATIVE
PROTEIN: 100 mg/dL — AB
Specific Gravity, Urine: 1.012 (ref 1.005–1.030)
pH: 5 (ref 5.0–8.0)

## 2017-02-17 LAB — TROPONIN I: TROPONIN I: 0.03 ng/mL — AB (ref <0.03)

## 2017-02-17 LAB — GLUCOSE, CAPILLARY: GLUCOSE-CAPILLARY: 89 mg/dL (ref 65–99)

## 2017-02-17 LAB — LACTIC ACID, PLASMA: Lactic Acid, Venous: 1.4 mmol/L (ref 0.5–1.9)

## 2017-02-17 MED ORDER — IOPAMIDOL (ISOVUE-300) INJECTION 61%
30.0000 mL | Freq: Once | INTRAVENOUS | Status: AC | PRN
  Administered 2017-02-17: 30 mL via ORAL

## 2017-02-17 MED ORDER — ONDANSETRON HCL 4 MG/2ML IJ SOLN
4.0000 mg | Freq: Once | INTRAMUSCULAR | Status: AC
  Administered 2017-02-17: 4 mg via INTRAVENOUS
  Filled 2017-02-17: qty 2

## 2017-02-17 MED ORDER — MORPHINE SULFATE (PF) 2 MG/ML IV SOLN
2.0000 mg | Freq: Once | INTRAVENOUS | Status: AC
  Administered 2017-02-17: 2 mg via INTRAVENOUS
  Filled 2017-02-17: qty 1

## 2017-02-17 NOTE — ED Provider Notes (Addendum)
[facility] Emergency Department Provider Note  ____________________________________________   I have reviewed the triage vital signs and the nursing notes.   HISTORY  Chief Complaint Weakness    HPI Evan [name] is a 83 y.o. male presents today stating he had an abdominal pain this morning. He denies any chest pain shows breath nausea or vomiting. He states he did not have an appetite yesterday. He denies any dysuria or urinary frequency. He states that the pain is "across here and indicates his upper abdomen. It is not exertional is not positional he has had no shortness of breath or pleuritic pain. He is very vaguely described discomfort. He states he feels a little bit weak. Not spoke with a however. He denies any headache or stiff neck. He denies any cough or shortness of breath.   Its overall he feels actually pretty good.  Past Medical History:  Diagnosis Date  . Arthritis   . BPH (benign prostatic hypertrophy)   . CAD (coronary artery disease)   . Chronic kidney disease, stage III (moderate)   . CVA (cerebral vascular accident) ([place])   . Degenerative disc disease   . Diabetes mellitus   . Gastroenteritis   . GERD (gastroesophageal reflux disease)   . Hyperlipidemia   . Hypertension   . Polymyalgia rheumatica [facility])     Patient Active Problem List   Diagnosis Date Noted  . Aortic atherosclerosis ([place]) 02/17/2017  . Advance directive discussed with patient 12/26/2016  . COPD (chronic obstructive pulmonary disease) ([place]) 02/05/2016  . Thrombocytopenia ([place]) 02/05/2016  . Dysphagia 10/15/2015  . Mild malnutrition ([place]) 05/01/2015  . Nocturnal hypoxemia 06/12/2014  . Diabetes, polyneuropathy ([place]) 10/22/2013  . CKD stage 3 due to type 2 diabetes mellitus ([place]) 10/22/2013  . Type 2 diabetes mellitus with renal manifestations, controlled 10/22/2013  . Routine general medical examination at a health care facility 06/05/2013  . Chronic  constipation 07/31/2012  . Polymyalgia rheumatica ([place]) 01/25/2010  . Essential hypertension, benign 08/28/2008  . [place] DISEASE, LUMBAR SPINE 08/28/2008  . OSTEOARTHRITIS 05/07/2007  . ATHEROSCLEROTIC CARDIOVASCULAR DISEASE 04/27/2007  . BENIGN PROSTATIC HYPERTROPHY 04/27/2007  . HYPERLIPIDEMIA 04/20/2007  . Atherosclerotic heart disease of native coronary artery with angina pectoris ([place]) 04/20/2007  . GERD 04/20/2007    Past Surgical History:  Procedure Laterality Date  . ANGIOPLASTY    . CATARACT EXTRACTION  1999 1992   glaucoma  . ESOPHAGEAL DILATION    . ESOPHAGOGASTRODUODENOSCOPY N/A 04/09/2015   Procedure: ESOPHAGOGASTRODUODENOSCOPY (EGD);  Surgeon: Josefine Class, MD;  Location: [facility] ENDOSCOPY;  Service: Endoscopy;  Laterality: N/A;  . SAVORY DILATION N/A 04/09/2015   Procedure: SAVORY DILATION;  Surgeon: Josefine Class, MD;  Location: [facility] ENDOSCOPY;  Service: Endoscopy;  Laterality: N/A;  . US ECHOCARDIOGRAPHY  04/2004   benign    Prior to Admission medications   Medication Sig Start Date End Date Taking? Authorizing Provider  aspirin EC 81 MG tablet Take 81 mg by mouth daily.   Yes Historical Provider, MD  fluticasone (FLONASE) 50 MCG/ACT nasal spray Place 1 spray into both nostrils daily.   Yes Historical Provider, MD  gabapentin (NEURONTIN) 100 MG capsule Take 1 capsule in the morning and 2 capsules at bedtime. 01/31/17  Yes Venia Carbon, MD  glipiZIDE (GLUCOTROL) 5 MG tablet Take 1 tablet (5 mg total) by mouth daily. 01/31/17  Yes Venia Carbon, MD  hydrALAZINE (APRESOLINE) 100 MG tablet Take 1 tablet (100 mg total) by mouth 4 (  four) times daily. 11/14/16  Yes Venia Carbon, MD  insulin lispro (HUMALOG) 100 UNIT/ML injection Inject 2 Units into the skin 3 (three) times daily before meals. Pt uses per sliding scale.   Yes Historical Provider, MD  lisinopril (PRINIVIL,ZESTRIL) 10 MG tablet Take 1 tablet (10 mg total) by mouth daily. 01/31/17  Yes  Venia Carbon, MD  omeprazole (PRILOSEC) 40 MG capsule Take 1 capsule (40 mg total) by mouth daily. 12/26/16  Yes Venia Carbon, MD  pravastatin (PRAVACHOL) 40 MG tablet Take 1 tablet (40 mg total) by mouth daily. 01/31/17  Yes Venia Carbon, MD  predniSONE (DELTASONE) 10 MG tablet Take 1 tablet (10 mg total) by mouth daily. 05/20/16  Yes Venia Carbon, MD  tamsulosin (FLOMAX) 0.4 MG CAPS capsule Take 1 capsule (0.4 mg total) by mouth daily. 01/31/17  Yes Venia Carbon, MD  timolol (TIMOPTIC) 0.5 % ophthalmic solution Place 1 drop into both eyes daily.   Yes Historical Provider, MD  verapamil (CALAN) 40 MG tablet TAKE 1 TABLET TWICE A DAY 09/05/16  Yes Venia Carbon, MD  Blood Glucose Monitoring Suppl (ONE TOUCH ULTRA 2) w/Device KIT 1 Device by Does not apply route once. Diagnosis: Diabetes Type 2 DX Code: E11.40 01/14/16   Venia Carbon, MD  GLUCERNA [facility]) LIQD Take 237 mLs by mouth 2 (two) times daily between meals.    Historical Provider, MD  glucose blood (ONE TOUCH TEST STRIPS) test strip Test blood sugar 3 times a day. 01/14/16   Venia Carbon, MD  nitroGLYCERIN (NITROSTAT) 0.4 MG SL tablet Place 1 tablet (0.4 mg total) under the tongue every 5 (five) minutes as needed for chest pain. 03/17/16   Venia Carbon, MD  [facility] DELICA LANCETS 85I MISC 1 strip by Does not apply route 3 (three) times daily with meals. Diagnosis: Diabetes Type 2 DX Code: E11.40 01/14/16   Venia Carbon, MD  polyethylene glycol (MIRALAX / GLYCOLAX) packet Take 17 g by mouth daily. Every other day    Historical Provider, MD  traMADol (ULTRAM) 50 MG tablet TAKE  (1)  TABLET  THREE TIMES DAILY AS NEEDED FOR PAIN. 11/10/16   Venia Carbon, MD    Allergies Penicillins  Family History  Problem Relation Age of Onset  . Constipation Mother     Social History Social History  Substance Use Topics  . Smoking status: Former Research scientist (life sciences)  . Smokeless tobacco: Never Used     Comment: quit over 20  years ago  . Alcohol use No    Review of Systems Constitutional: No fever/chills Eyes: No visual changes. ENT: No sore throat. No stiff neck no neck pain Cardiovascular: Denies chest pain. Respiratory: Denies shortness of breath. Gastrointestinal:   no vomiting.  No diarrhea.  No constipation. Genitourinary: Negative for dysuria. Musculoskeletal: Negative lower extremity swelling Skin: Negative for rash. Neurological: Negative for severe headaches, focal weakness or numbness. 10-point ROS otherwise negative.  ____________________________________________   PHYSICAL EXAM:  VITAL SIGNS: ED Triage Vitals  Enc Vitals Group     BP 02/17/17 0850 (!) 173/67     Pulse Rate 02/17/17 0850 62     Resp 02/17/17 0850 16     Temp 02/17/17 0850 97.6 F (36.4 C)     Temp Source 02/17/17 0850 Oral     SpO2 02/17/17 0850 98 %     Weight 02/17/17 0843 154 lb (69.9 kg)     Height 02/17/17 0843 '6\' 3"'$  (1.905  m)     Head Circumference --      Peak Flow --      Pain Score 02/17/17 1109 4     Pain Loc --      Pain Edu? --      Excl. in [place]? --     Constitutional: Alert and oriented. Well appearing and in no acute distress. Eyes: Conjunctivae are normal. PERRL. EOMI. Head: Atraumatic. Nose: No congestion/rhinnorhea. Mouth/Throat: Mucous membranes are moist.  Oropharynx non-erythematous. Neck: No stridor.   Nontender with no meningismus Cardiovascular: Normal rate, regular rhythm. Grossly normal heart sounds.  Good peripheral circulation. Respiratory: Normal respiratory effort.  No retractions. Lungs CTAB. Abdominal: Soft and nontender. No distention. No guarding no rebound Back:  There is no focal tenderness or step off.  there is no midline tenderness there are no lesions noted. there is no CVA tenderness Musculoskeletal: No lower extremity tenderness, no upper extremity tenderness. No joint effusions, no DVT signs strong distal pulses no edema Neurologic:  Normal speech and language. No  gross focal neurologic deficits are appreciated.  Skin:  Skin is warm, dry and intact. No rash noted. Psychiatric: Mood and affect are normal. Speech and behavior are normal.  ____________________________________________   LABS (all labs ordered are listed, but only abnormal results are displayed)  Labs Reviewed  BASIC METABOLIC PANEL - Abnormal; Notable for the following:       Result Value   Glucose, Bld 114 (*)    BUN 43 (*)    Creatinine, Ser 2.34 (*)    GFR calc non Af Amer 22 (*)    GFR calc Af Amer 25 (*)    All other components within normal limits  CBC - Abnormal; Notable for the following:    WBC 10.8 (*)    RBC 4.18 (*)    Hemoglobin 12.0 (*)    HCT 37.3 (*)    Platelets 133 (*)    All other components within normal limits  URINALYSIS, COMPLETE (UACMP) WITH MICROSCOPIC - Abnormal; Notable for the following:    Color, Urine YELLOW (*)    APPearance CLEAR (*)    Protein, ur 100 (*)    Squamous Epithelial / LPF 0-5 (*)    All other components within normal limits  HEPATIC FUNCTION PANEL - Abnormal; Notable for the following:    Total Protein 6.3 (*)    Albumin 3.0 (*)    ALT 10 (*)    Bilirubin, Direct <0.1 (*)    All other components within normal limits  TROPONIN I - Abnormal; Notable for the following:    Troponin I 0.03 (*)    All other components within normal limits  GLUCOSE, CAPILLARY  PROTIME-INR  LACTIC ACID, PLASMA  LIPASE, BLOOD  LACTIC ACID, PLASMA  TROPONIN I  CBG MONITORING, ED   ____________________________________________  EKG  I personally interpreted any EKGs ordered by me or triage Sinus rhythm rate 60 bpm no acute ST elevation or acute ST depression ossific ST changes normal axis. ____________________________________________  [id number]  I reviewed any imaging ordered by me or triage that were performed during my shift and, if possible, patient and/or family made aware of any abnormal  findings. ____________________________________________   PROCEDURES  Procedure(s) performed: None  Procedures  Critical Care performed: None  ____________________________________________   INITIAL IMPRESSION / ASSESSMENT AND PLAN / ED COURSE  Pertinent labs & imaging results that were available during my care of the patient were reviewed by me and considered in my medical  decision making (see chart for details).  Patient here with abdominal discomfort. He does not have a surgical abdomen. I don't believe he has "pain out of proportion to exam". He is however he is eating. Vital signs and blood pressure are reassuring. Patient is 85 however so we did do a pre-broad workup. Patient does not appear to have a urinary tract infection with no evidence of pancreatitis, glucose is reassuring, patient is at his baseline terms of his BUN and creatinine, white count is reassuring he is not anemic, her function tests are reassuring. CT scan is reassuring. Ultrasound is reassuring. I did do a headache enzyme which is 0.03 which is essentially negative and consistent with multiple prior but we will do a repeat troponin as an insurance however very low suspicion that this represents ACS. This is negative I think patient may be only go home.   ----------------------------------------- 2:32 PM on 02/17/2017 -----------------------------------------   He is awake alert he has no discomfort any is eating and drinking at this time to suggest ischemic gut and nothing to suggest pain is "out of proportion to exam". He is not having any pain at this time. He is 83 years old for her to go home. He is requesting discharge she is eating and drinking. Nothing to suggest acute pathology today I discussed with Linford Arnold, his family member who states that she is very covered with this plan and would much prefer to have him go home. I Event that there are obviously some things we cannot rule out and that an  observational stay might give [place] more information but they patient and family are very much interested in having him go home which we will do. Return precautions and follow given and understood.      ____________________________________________   FINAL CLINICAL IMPRESSION(S) / ED DIAGNOSES  Final diagnoses:  Abdominal pain      This chart was dictated using voice recognition software.  Despite best efforts to proofread,  errors can occur which can change meaning.      Schuyler Amor, MD 02/17/17 [place], MD 02/17/17 [id number]

## 2017-02-17 NOTE — ED Notes (Signed)
Pt given orange juice.

## 2017-02-17 NOTE — ED Triage Notes (Signed)
Pt came to ED via EMS from home. Called out for low blood sugar. On arrival, pt cbg was 119. Pt c/o generalized weakness and lower abdominal pain. Reports nausea this morning, denies currently. No vomitting.

## 2017-02-17 NOTE — ED Notes (Signed)
Patient given food tray and water. Will continue to monitor for nausea and abdominal pain.

## 2017-02-17 NOTE — ED Notes (Signed)
Electronic signature pad not working at this time. Patient verbalizes understanding of discharge instructions. No further questions.

## 2017-02-24 ENCOUNTER — Encounter: Payer: Self-pay | Admitting: Internal Medicine

## 2017-02-24 ENCOUNTER — Ambulatory Visit (INDEPENDENT_AMBULATORY_CARE_PROVIDER_SITE_OTHER): Payer: Medicare Other | Admitting: Internal Medicine

## 2017-02-24 VITALS — BP 122/60 | HR 65 | Temp 97.3°F | Wt 166.8 lb

## 2017-02-24 DIAGNOSIS — R1084 Generalized abdominal pain: Secondary | ICD-10-CM | POA: Diagnosis not present

## 2017-02-24 NOTE — Progress Notes (Signed)
Subjective:    Patient ID: Evan [name], male    DOB: 08-31-1918, 83 y.o.   MRN: [id number]  HPI Here for ER follow up of abdominal pain Was there 1 week ago  Awoke that morning Couldn't walk and having vision problems Passed out a bit Aide called 911----to ER No reported issues with vital signs Glucose in ER 290---so probably not hypoglycemia By the time he got to the hospital---vision had returned Noticed generalized abdominal pain when in ER and more aware Got some morphine---- CT/ultrasound benign Improved some there--- was able to walk a little when he got home  Using rollator Appetite is pretty good  Current Outpatient Prescriptions on File Prior to Visit  Medication Sig Dispense Refill  . aspirin EC 81 MG tablet Take 81 mg by mouth daily.    . Blood Glucose Monitoring Suppl (ONE TOUCH ULTRA 2) w/Device KIT 1 Device by Does not apply route once. Diagnosis: Diabetes Type 2 DX Code: E11.40 1 each 0  . fluticasone (FLONASE) 50 MCG/ACT nasal spray Place 1 spray into both nostrils daily.    Marland Kitchen gabapentin (NEURONTIN) 100 MG capsule Take 1 capsule in the morning and 2 capsules at bedtime. 270 capsule 3  . glipiZIDE (GLUCOTROL) 5 MG tablet Take 1 tablet (5 mg total) by mouth daily. 90 tablet 3  . GLUCERNA (GLUCERNA) LIQD Take 237 mLs by mouth 2 (two) times daily between meals.    Marland Kitchen glucose blood (ONE TOUCH TEST STRIPS) test strip Test blood sugar 3 times a day. 100 each 5  . hydrALAZINE (APRESOLINE) 100 MG tablet Take 1 tablet (100 mg total) by mouth 4 (four) times daily. 120 tablet 0  . insulin lispro (HUMALOG) 100 UNIT/ML injection Inject 2 Units into the skin 3 (three) times daily before meals. Pt uses per sliding scale.    Marland Kitchen lisinopril (PRINIVIL,ZESTRIL) 10 MG tablet Take 1 tablet (10 mg total) by mouth daily. 90 tablet 3  . nitroGLYCERIN (NITROSTAT) 0.4 MG SL tablet Place 1 tablet (0.4 mg total) under the tongue every 5 (five) minutes as needed for chest pain. 25 tablet 2  .  omeprazole (PRILOSEC) 40 MG capsule Take 1 capsule (40 mg total) by mouth daily. 90 capsule 3  . ONETOUCH DELICA LANCETS 16S MISC 1 strip by Does not apply route 3 (three) times daily with meals. Diagnosis: Diabetes Type 2 DX Code: E11.40 100 each 5  . polyethylene glycol (MIRALAX / GLYCOLAX) packet Take 17 g by mouth daily. Every other day    . pravastatin (PRAVACHOL) 40 MG tablet Take 1 tablet (40 mg total) by mouth daily. 90 tablet 3  . predniSONE (DELTASONE) 10 MG tablet Take 1 tablet (10 mg total) by mouth daily. 30 tablet 11  . tamsulosin (FLOMAX) 0.4 MG CAPS capsule Take 1 capsule (0.4 mg total) by mouth daily. 90 capsule 3  . timolol (TIMOPTIC) 0.5 % ophthalmic solution Place 1 drop into both eyes daily.    . traMADol (ULTRAM) 50 MG tablet TAKE  (1)  TABLET  THREE TIMES DAILY AS NEEDED FOR PAIN. 30 tablet 0  . verapamil (CALAN) 40 MG tablet TAKE 1 TABLET TWICE A DAY 180 tablet 1   No current facility-administered medications on file prior to visit.     Allergies  Allergen Reactions  . Penicillins Rash and Other (See Comments)    Has patient had a PCN reaction causing immediate rash, facial/tongue/throat swelling, SOB or lightheadedness with hypotension: No Has patient had a PCN reaction  causing severe rash involving mucus membranes or skin necrosis: No Has patient had a PCN reaction that required hospitalization No Has patient had a PCN reaction occurring within the last 10 years: No If all of the above answers are "NO", then may proceed with Cephalosporin use.     Past Medical History:  Diagnosis Date  . Arthritis   . BPH (benign prostatic hypertrophy)   . CAD (coronary artery disease)   . Chronic kidney disease, stage III (moderate)   . CVA (cerebral vascular accident) ([place])   . Degenerative disc disease   . Diabetes mellitus   . Gastroenteritis   . GERD (gastroesophageal reflux disease)   . Hyperlipidemia   . Hypertension   . Polymyalgia rheumatica ([facility])     Past  Surgical History:  Procedure Laterality Date  . ANGIOPLASTY    . CATARACT EXTRACTION  1999 1992   glaucoma  . ESOPHAGEAL DILATION    . ESOPHAGOGASTRODUODENOSCOPY N/A 04/09/2015   Procedure: ESOPHAGOGASTRODUODENOSCOPY (EGD);  Surgeon: Josefine Class, MD;  Location[facility] ENDOSCOPY;  Service: Endoscopy;  Laterality: N/A;  . SAVORY DILATION N/A 04/09/2015   Procedure: SAVORY DILATION;  Surgeon: Josefine Class, MD;  Location: [facility] ENDOSCOPY;  Service: Endoscopy;  Laterality: N/A;  . US ECHOCARDIOGRAPHY  04/2004   benign    Family History  Problem Relation Age of Onset  . Constipation Mother     Social History   Social History  . Marital status: Married    Spouse name: N/A  . Number of children: N/A  . Years of education: N/A   Occupational History  . retired    Social History Main Topics  . Smoking status: Former Research scientist (life sciences)  . Smokeless tobacco: Never Used     Comment: quit over 20 years ago  . Alcohol use No  . Drug use: No  . Sexual activity: Not on file   Other Topics Concern  . Not on file   Social History Narrative   Religion affecting care--involved with Church throughout life (deacon, etc)      Has living will    Requests son [name]  as his health care power of attorney   Requests DNR--- done 10/15/14   No tube feeds if cognitively unaware            Review of Systems No N/V Bowels remain slow---still takes the miralax Voiding okay    Objective:   Physical Exam  Constitutional: No distress.  Neck: No thyromegaly present.  Cardiovascular: Normal rate, regular rhythm and normal heart sounds.  Exam reveals no gallop.   No murmur heard. Pulmonary/Chest: Effort normal and breath sounds normal. No respiratory distress. He has no wheezes. He has no rales.  Musculoskeletal: He exhibits no edema.  Lymphadenopathy:    He has no cervical adenopathy.  Psychiatric: He has a normal mood and affect. His behavior is normal.          Assessment & Plan:

## 2017-02-24 NOTE — Progress Notes (Signed)
Pre visit review using our clinic review tool, if applicable. No additional management support is needed unless otherwise documented below in the visit note. 

## 2017-02-24 NOTE — Assessment & Plan Note (Signed)
Had spell of decreased responsiveness and later abdominal pain ER work up negative--reviewed all studies and labs (renal function slightly increased) Sugar was high so doubt hypoglycemia Could have been hypotensive--but no clear reason why BP generally okay Discussed increasing aide time---only 2 hours per day in morning. [facility] not [facility]

## 2017-03-08 LAB — HM DIABETES EYE EXAM

## 2017-03-13 ENCOUNTER — Encounter: Payer: Self-pay | Admitting: Emergency Medicine

## 2017-03-13 ENCOUNTER — Observation Stay
Admit: 2017-03-13 | Discharge: 2017-03-13 | Disposition: A | Discharge status: Home / Self Care | Payer: Medicare Other | Attending: Internal Medicine | Admitting: Internal Medicine

## 2017-03-13 ENCOUNTER — Observation Stay
Admission: EM | Admit: 2017-03-13 | Discharge: 2017-03-15 | Disposition: A | Discharge status: Home / Self Care | Payer: Medicare Other | Attending: Internal Medicine | Admitting: Internal Medicine

## 2017-03-13 ENCOUNTER — Emergency Department: Payer: Medicare Other

## 2017-03-13 DIAGNOSIS — I129 Hypertensive chronic kidney disease with stage 1 through stage 4 chronic kidney disease, or unspecified chronic kidney disease: Secondary | ICD-10-CM | POA: Diagnosis not present

## 2017-03-13 DIAGNOSIS — R531 Weakness: Secondary | ICD-10-CM | POA: Insufficient documentation

## 2017-03-13 DIAGNOSIS — I252 Old myocardial infarction: Secondary | ICD-10-CM | POA: Diagnosis not present

## 2017-03-13 DIAGNOSIS — Z7982 Long term (current) use of aspirin: Secondary | ICD-10-CM | POA: Diagnosis not present

## 2017-03-13 DIAGNOSIS — E785 Hyperlipidemia, unspecified: Secondary | ICD-10-CM | POA: Insufficient documentation

## 2017-03-13 DIAGNOSIS — Z880 Allergy status to penicillin: Secondary | ICD-10-CM | POA: Insufficient documentation

## 2017-03-13 DIAGNOSIS — M353 Polymyalgia rheumatica: Secondary | ICD-10-CM | POA: Diagnosis not present

## 2017-03-13 DIAGNOSIS — Z79899 Other long term (current) drug therapy: Secondary | ICD-10-CM | POA: Insufficient documentation

## 2017-03-13 DIAGNOSIS — K219 Gastro-esophageal reflux disease without esophagitis: Secondary | ICD-10-CM | POA: Diagnosis not present

## 2017-03-13 DIAGNOSIS — K8020 Calculus of gallbladder without cholecystitis without obstruction: Secondary | ICD-10-CM | POA: Insufficient documentation

## 2017-03-13 DIAGNOSIS — E1142 Type 2 diabetes mellitus with diabetic polyneuropathy: Secondary | ICD-10-CM | POA: Diagnosis not present

## 2017-03-13 DIAGNOSIS — Z66 Do not resuscitate: Secondary | ICD-10-CM | POA: Insufficient documentation

## 2017-03-13 DIAGNOSIS — Z8379 Family history of other diseases of the digestive system: Secondary | ICD-10-CM | POA: Insufficient documentation

## 2017-03-13 DIAGNOSIS — M5136 Other intervertebral disc degeneration, lumbar region: Secondary | ICD-10-CM | POA: Insufficient documentation

## 2017-03-13 DIAGNOSIS — Z9889 Other specified postprocedural states: Secondary | ICD-10-CM | POA: Insufficient documentation

## 2017-03-13 DIAGNOSIS — R079 Chest pain, unspecified: Secondary | ICD-10-CM | POA: Diagnosis present

## 2017-03-13 DIAGNOSIS — N183 Chronic kidney disease, stage 3 (moderate): Secondary | ICD-10-CM | POA: Diagnosis not present

## 2017-03-13 DIAGNOSIS — I2510 Atherosclerotic heart disease of native coronary artery without angina pectoris: Secondary | ICD-10-CM | POA: Insufficient documentation

## 2017-03-13 DIAGNOSIS — K5730 Diverticulosis of large intestine without perforation or abscess without bleeding: Secondary | ICD-10-CM | POA: Insufficient documentation

## 2017-03-13 DIAGNOSIS — N400 Enlarged prostate without lower urinary tract symptoms: Secondary | ICD-10-CM | POA: Diagnosis not present

## 2017-03-13 DIAGNOSIS — M1990 Unspecified osteoarthritis, unspecified site: Secondary | ICD-10-CM | POA: Diagnosis not present

## 2017-03-13 DIAGNOSIS — Z8673 Personal history of transient ischemic attack (TIA), and cerebral infarction without residual deficits: Secondary | ICD-10-CM | POA: Insufficient documentation

## 2017-03-13 DIAGNOSIS — Z794 Long term (current) use of insulin: Secondary | ICD-10-CM | POA: Insufficient documentation

## 2017-03-13 DIAGNOSIS — I700 Atherosclerosis of aorta: Secondary | ICD-10-CM | POA: Insufficient documentation

## 2017-03-13 DIAGNOSIS — E1122 Type 2 diabetes mellitus with diabetic chronic kidney disease: Secondary | ICD-10-CM | POA: Insufficient documentation

## 2017-03-13 DIAGNOSIS — Z791 Long term (current) use of non-steroidal anti-inflammatories (NSAID): Secondary | ICD-10-CM | POA: Insufficient documentation

## 2017-03-13 DIAGNOSIS — Z833 Family history of diabetes mellitus: Secondary | ICD-10-CM | POA: Insufficient documentation

## 2017-03-13 DIAGNOSIS — J449 Chronic obstructive pulmonary disease, unspecified: Secondary | ICD-10-CM | POA: Diagnosis not present

## 2017-03-13 DIAGNOSIS — Z87891 Personal history of nicotine dependence: Secondary | ICD-10-CM | POA: Insufficient documentation

## 2017-03-13 DIAGNOSIS — Z9861 Coronary angioplasty status: Secondary | ICD-10-CM | POA: Insufficient documentation

## 2017-03-13 LAB — URINALYSIS, COMPLETE (UACMP) WITH MICROSCOPIC
Bacteria, UA: NONE SEEN
Bilirubin Urine: NEGATIVE
GLUCOSE, UA: 50 mg/dL — AB
HGB URINE DIPSTICK: NEGATIVE
Ketones, ur: NEGATIVE mg/dL
Leukocytes, UA: NEGATIVE
NITRITE: NEGATIVE
Protein, ur: 30 mg/dL — AB
SPECIFIC GRAVITY, URINE: 1.011 (ref 1.005–1.030)
Squamous Epithelial / LPF: NONE SEEN
pH: 6 (ref 5.0–8.0)

## 2017-03-13 LAB — CBC
HCT: 30.9 % — ABNORMAL LOW (ref 40.0–52.0)
Hemoglobin: 10.2 g/dL — ABNORMAL LOW (ref 13.0–18.0)
MCH: 29.3 pg (ref 26.0–34.0)
MCHC: 33.1 g/dL (ref 32.0–36.0)
MCV: 88.7 fL (ref 80.0–100.0)
PLATELETS: 137 10*3/uL — AB (ref 150–440)
RBC: 3.49 MIL/uL — ABNORMAL LOW (ref 4.40–5.90)
RDW: 12.2 % (ref 11.5–14.5)
WBC: 7.6 10*3/uL (ref 3.8–10.6)

## 2017-03-13 LAB — LIPID PANEL
Cholesterol: 91 mg/dL (ref 0–200)
HDL: 31 mg/dL — ABNORMAL LOW (ref >40)
LDL CALC: 46 mg/dL (ref 0–99)
Total CHOL/HDL Ratio: 2.9 RATIO
Triglycerides: 69 mg/dL (ref <150)
VLDL: 14 mg/dL (ref 0–40)

## 2017-03-13 LAB — BASIC METABOLIC PANEL
Anion gap: 7 (ref 5–15)
BUN: 40 mg/dL — ABNORMAL HIGH (ref 6–20)
CALCIUM: 8.4 mg/dL — AB (ref 8.9–10.3)
CO2: 26 mmol/L (ref 22–32)
CREATININE: 2.23 mg/dL — AB (ref 0.61–1.24)
Chloride: 108 mmol/L (ref 101–111)
GFR calc Af Amer: 27 mL/min — ABNORMAL LOW (ref >60)
GFR, EST NON AFRICAN AMERICAN: 23 mL/min — AB (ref >60)
Glucose, Bld: 143 mg/dL — ABNORMAL HIGH (ref 65–99)
Potassium: 4.1 mmol/L (ref 3.5–5.1)
Sodium: 141 mmol/L (ref 135–145)

## 2017-03-13 LAB — GLUCOSE, CAPILLARY
GLUCOSE-CAPILLARY: 186 mg/dL — AB (ref 65–99)
GLUCOSE-CAPILLARY: 223 mg/dL — AB (ref 65–99)
Glucose-Capillary: 131 mg/dL — ABNORMAL HIGH (ref 65–99)

## 2017-03-13 LAB — TROPONIN I
TROPONIN I: 0.04 ng/mL — AB (ref <0.03)
TROPONIN I: 0.03 ng/mL — AB (ref <0.03)
Troponin I: 0.03 ng/mL (ref <0.03)

## 2017-03-13 MED ORDER — FLUTICASONE PROPIONATE 50 MCG/ACT NA SUSP
1.0000 | Freq: Every day | NASAL | Status: DC | PRN
  Filled 2017-03-13: qty 16

## 2017-03-13 MED ORDER — GABAPENTIN 100 MG PO CAPS
200.0000 mg | ORAL_CAPSULE | Freq: Every day | ORAL | Status: DC
  Administered 2017-03-13 – 2017-03-14 (×2): 200 mg via ORAL
  Filled 2017-03-13 (×2): qty 2

## 2017-03-13 MED ORDER — LATANOPROST 0.005 % OP SOLN
1.0000 [drp] | Freq: Every day | OPHTHALMIC | Status: DC
  Administered 2017-03-13 – 2017-03-14 (×2): 1 [drp] via OPHTHALMIC
  Filled 2017-03-13: qty 2.5

## 2017-03-13 MED ORDER — INSULIN ASPART 100 UNIT/ML ~~LOC~~ SOLN
0.0000 [IU] | Freq: Three times a day (TID) | SUBCUTANEOUS | Status: DC
  Administered 2017-03-13: 2 [IU] via SUBCUTANEOUS
  Administered 2017-03-14: 3 [IU] via SUBCUTANEOUS
  Administered 2017-03-14: 1 [IU] via SUBCUTANEOUS
  Filled 2017-03-13: qty 3
  Filled 2017-03-13: qty 1
  Filled 2017-03-13: qty 2

## 2017-03-13 MED ORDER — ISOSORBIDE MONONITRATE ER 30 MG PO TB24
30.0000 mg | ORAL_TABLET | Freq: Every day | ORAL | Status: DC
  Administered 2017-03-13 – 2017-03-15 (×3): 30 mg via ORAL
  Filled 2017-03-13 (×3): qty 1

## 2017-03-13 MED ORDER — PREDNISONE 10 MG PO TABS
10.0000 mg | ORAL_TABLET | Freq: Every day | ORAL | Status: DC
  Administered 2017-03-14 – 2017-03-15 (×2): 10 mg via ORAL
  Filled 2017-03-13 (×2): qty 1

## 2017-03-13 MED ORDER — NITROGLYCERIN 0.4 MG SL SUBL
0.4000 mg | SUBLINGUAL_TABLET | SUBLINGUAL | Status: DC | PRN
Start: 2017-03-13 — End: 2017-03-15

## 2017-03-13 MED ORDER — GABAPENTIN 100 MG PO CAPS
100.0000 mg | ORAL_CAPSULE | Freq: Every day | ORAL | Status: DC
  Administered 2017-03-14 – 2017-03-15 (×2): 100 mg via ORAL
  Filled 2017-03-13 (×2): qty 1

## 2017-03-13 MED ORDER — LISINOPRIL 10 MG PO TABS: 10.0000 mg | ORAL_TABLET | Freq: Every day | ORAL | Status: DC

## 2017-03-13 MED ORDER — TRAMADOL HCL 50 MG PO TABS: 50.0000 mg | ORAL_TABLET | Freq: Two times a day (BID) | ORAL | Status: DC | PRN

## 2017-03-13 MED ORDER — VERAPAMIL HCL 40 MG PO TABS
40.0000 mg | ORAL_TABLET | Freq: Two times a day (BID) | ORAL | Status: DC
  Administered 2017-03-13 – 2017-03-15 (×5): 40 mg via ORAL
  Filled 2017-03-13 (×2): qty 0.5
  Filled 2017-03-13 (×5): qty 1

## 2017-03-13 MED ORDER — GLIPIZIDE 5 MG PO TABS
5.0000 mg | ORAL_TABLET | Freq: Every day | ORAL | Status: DC
  Administered 2017-03-13 – 2017-03-15 (×3): 5 mg via ORAL
  Filled 2017-03-13 (×3): qty 1

## 2017-03-13 MED ORDER — PRAVASTATIN SODIUM 40 MG PO TABS
40.0000 mg | ORAL_TABLET | Freq: Every day | ORAL | Status: DC
  Administered 2017-03-13 – 2017-03-14 (×2): 40 mg via ORAL
  Filled 2017-03-13 (×2): qty 1

## 2017-03-13 MED ORDER — HYDRALAZINE HCL 100 MG PO TABS: 100.0000 mg | ORAL_TABLET | Freq: Four times a day (QID) | ORAL | Status: DC

## 2017-03-13 MED ORDER — TIMOLOL MALEATE 0.5 % OP SOLN
1.0000 [drp] | Freq: Every day | OPHTHALMIC | Status: DC
Start: 2017-03-13 — End: 2017-03-15
  Administered 2017-03-13 – 2017-03-15 (×3): 1 [drp] via OPHTHALMIC
  Filled 2017-03-13 (×2): qty 5

## 2017-03-13 MED ORDER — PANTOPRAZOLE SODIUM 40 MG PO TBEC
40.0000 mg | DELAYED_RELEASE_TABLET | Freq: Every day | ORAL | Status: DC
  Administered 2017-03-13 – 2017-03-15 (×3): 40 mg via ORAL
  Filled 2017-03-13 (×3): qty 1

## 2017-03-13 MED ORDER — HEPARIN SODIUM (PORCINE) 5000 UNIT/ML IJ SOLN
5000.0000 [IU] | Freq: Three times a day (TID) | INTRAMUSCULAR | Status: DC
  Administered 2017-03-13 – 2017-03-14 (×3): 5000 [IU] via SUBCUTANEOUS
  Filled 2017-03-13 (×3): qty 1

## 2017-03-13 MED ORDER — DOCUSATE SODIUM 100 MG PO CAPS: 100.0000 mg | ORAL_CAPSULE | Freq: Two times a day (BID) | ORAL | Status: DC | PRN

## 2017-03-13 MED ORDER — TAMSULOSIN HCL 0.4 MG PO CAPS
0.4000 mg | ORAL_CAPSULE | Freq: Every day | ORAL | Status: DC
  Administered 2017-03-13 – 2017-03-15 (×3): 0.4 mg via ORAL
  Filled 2017-03-13 (×3): qty 1

## 2017-03-13 MED ORDER — ASPIRIN EC 81 MG PO TBEC
81.0000 mg | DELAYED_RELEASE_TABLET | Freq: Every day | ORAL | Status: DC
  Administered 2017-03-13 – 2017-03-15 (×3): 81 mg via ORAL
  Filled 2017-03-13 (×3): qty 1

## 2017-03-13 MED ORDER — METOPROLOL TARTRATE 25 MG PO TABS
25.0000 mg | ORAL_TABLET | Freq: Two times a day (BID) | ORAL | Status: DC
  Administered 2017-03-13 – 2017-03-15 (×5): 25 mg via ORAL
  Filled 2017-03-13 (×5): qty 1

## 2017-03-13 NOTE — ED Triage Notes (Signed)
Pt via ems from home with new onset weakness since last night approx 2030. Pt states he had some chest pain yesterday ("tenderness") that has since resolved. Pt alert & oriented with NAD noted.

## 2017-03-13 NOTE — ED Notes (Signed)
Report given to floor 2A Alvan DameAshley R.N.

## 2017-03-13 NOTE — ED Provider Notes (Signed)
[facility] Emergency Department Provider Note       Time seen: ----------------------------------------- 8:45 AM on 03/13/2017 -----------------------------------------     I have reviewed the triage vital signs and the nursing notes.   HISTORY   Chief Complaint Weakness    HPI RONTRELL MOQUIN is a 83 y.o. male who presents to the ED for weakness that started last night around 2030. Patient states he had some chest pain since yesterday. He describes left chest wall soreness. He denies fevers, chills, cough, vomiting or diarrhea. Patient describes soreness in his chest that is 5 out of 10.   Past Medical History:  Diagnosis Date  . Arthritis   . BPH (benign prostatic hypertrophy)   . CAD (coronary artery disease)   . Chronic kidney disease, stage III (moderate)   . CVA (cerebral vascular accident) (HCC)   . Degenerative disc disease   . Diabetes mellitus   . Gastroenteritis   . GERD (gastroesophageal reflux disease)   . Hyperlipidemia   . Hypertension   . Polymyalgia rheumatica [facility])     Patient Active Problem List   Diagnosis Date Noted  . Aortic atherosclerosis (HCC) 02/17/2017  . Advance directive discussed with patient 12/26/2016  . COPD (chronic obstructive pulmonary disease) (HCC) 02/05/2016  . Thrombocytopenia (HCC) 02/05/2016  . Dysphagia 10/15/2015  . Mild malnutrition (HCC) 05/01/2015  . Abdominal pain 03/18/2015  . Nocturnal hypoxemia 06/12/2014  . Diabetes, polyneuropathy (HCC) 10/22/2013  . CKD stage 3 due to type 2 diabetes mellitus (HCC) 10/22/2013  . Type 2 diabetes mellitus with renal manifestations, controlled 10/22/2013  . Routine general medical examination at a health care facility 06/05/2013  . Chronic constipation 07/31/2012  . Polymyalgia rheumatica (HCC) 01/25/2010  . Essential hypertension, benign 08/28/2008  . DEGENERATIVE DISC DISEASE, LUMBAR SPINE 08/28/2008  . OSTEOARTHRITIS 05/07/2007  .  ATHEROSCLEROTIC CARDIOVASCULAR DISEASE 04/27/2007  . BENIGN PROSTATIC HYPERTROPHY 04/27/2007  . HYPERLIPIDEMIA 04/20/2007  . Atherosclerotic heart disease of native coronary artery with angina pectoris (HCC) 04/20/2007  . GERD 04/20/2007    Past Surgical History:  Procedure Laterality Date  . ANGIOPLASTY    . CATARACT EXTRACTION  1999 1992   glaucoma  . ESOPHAGEAL DILATION    . ESOPHAGOGASTRODUODENOSCOPY N/A 04/09/2015   Procedure: ESOPHAGOGASTRODUODENOSCOPY (EGD);  Surgeon: Elnita Maxwell, MD;  Location: [facility] ENDOSCOPY;  Service: Endoscopy;  Laterality: N/A;  . SAVORY DILATION N/A 04/09/2015   Procedure: SAVORY DILATION;  Surgeon: Elnita Maxwell, MD;  Location: [facility] ENDOSCOPY;  Service: Endoscopy;  Laterality: N/A;  . US ECHOCARDIOGRAPHY  04/2004   benign    Allergies Penicillins  Social History Social History  Substance Use Topics  . Smoking status: Former Games developer  . Smokeless tobacco: Never Used     Comment: quit over 20 years ago  . Alcohol use No    Review of Systems Constitutional: Negative for fever. Eyes: Negative for vision changes ENT:  Negative for congestion, sore throat Cardiovascular: Positive for chest pain Respiratory: Negative for shortness of breath. Gastrointestinal: Negative for abdominal pain, vomiting and diarrhea. Genitourinary: Negative for dysuria. Musculoskeletal: Negative for back pain. Skin: Negative for rash. Neurological: Positive for generalized weakness  All systems negative/normal/unremarkable except as stated in the HPI  ____________________________________________   PHYSICAL EXAM:  VITAL SIGNS: ED Triage Vitals  Enc Vitals Group     BP 03/13/17 0834 (!) 175/71     Pulse Rate 03/13/17 0834 67     Resp 03/13/17 0834 13     Temp  03/13/17 0828 98.2 F (36.8 C)     Temp Source 03/13/17 0828 Oral     SpO2 03/13/17 0834 99 %     Weight 03/13/17 0828 165 lb (74.8 kg)     Height 03/13/17 0828 6\' 3"  (1.905 m)     Head  Circumference --      Peak Flow --      Pain Score 03/13/17 0834 0     Pain Loc --      Pain Edu? --      Excl. in GC? --     Constitutional: Alert and oriented. Well appearing and in no distress. Eyes: Conjunctivae are normal. PERRL. Normal extraocular movements. ENT   Head: Normocephalic and atraumatic.   Nose: No congestion/rhinnorhea.   Mouth/Throat: Mucous membranes are moist.   Neck: No stridor. Cardiovascular: Normal rate, regular rhythm. No murmurs, rubs, or gallops. Respiratory: Normal respiratory effort without tachypnea nor retractions. Breath sounds are clear and equal bilaterally. No wheezes/rales/rhonchi. Gastrointestinal: Soft and nontender. Normal bowel sounds Rectal: Nontender, heme-negative stool Musculoskeletal: Nontender with normal range of motion in extremities. No lower extremity tenderness nor edema. Neurologic:  Normal speech and language. No gross focal neurologic deficits are appreciated.  Skin:  Skin is warm, dry and intact. No rash noted. Psychiatric: Mood and affect are normal. Speech and behavior are normal.  ____________________________________________  EKG: Interpreted by me. Sinus rhythm rate of 69 bpm, normal PR interval, probable LVH with repolarization abnormality, normal QT. ST depressions are noted inferiorly and laterally  ____________________________________________  ED COURSE:  Pertinent labs & imaging results that were available during my care of the patient were reviewed by me and considered in my medical decision making (see chart for details). Patient presents for weakness and chest pain, we will assess with labs and imaging as indicated.   Procedures ____________________________________________   LABS (pertinent positives/negatives)  Labs Reviewed  BASIC METABOLIC PANEL - Abnormal; Notable for the following:       Result Value   Glucose, Bld 143 (*)    BUN 40 (*)    Creatinine, Ser 2.23 (*)    Calcium 8.4 (*)     GFR calc non Af Amer 23 (*)    GFR calc Af Amer 27 (*)    All other components within normal limits  CBC - Abnormal; Notable for the following:    RBC 3.49 (*)    Hemoglobin 10.2 (*)    HCT 30.9 (*)    Platelets 137 (*)    All other components within normal limits  URINALYSIS, COMPLETE (UACMP) WITH MICROSCOPIC  CBG MONITORING, ED    RADIOLOGY Images were viewed by me  Chest x-ray Is unremarkable ____________________________________________  FINAL ASSESSMENT AND PLAN  Chest pain, weakness  Plan: Patient's labs and imaging were dictated above. Patient had presented for chest pain and weakness. Patient is more anemic than normal but is heme negative and this is of uncertain etiology. We will discuss with the hospitalist for observation.   Emily FilbertWilliams, Avish Torry E, MD   Note: This note was generated in part or whole with voice recognition software. Voice recognition is usually quite accurate but there are transcription errors that can and very often do occur. I apologize for any typographical errors that were not detected and corrected.     Emily FilbertWilliams, Lorelle Macaluso E, MD 03/13/17 [phone]

## 2017-03-13 NOTE — Consult Note (Signed)
[facility] Cardiology  CARDIOLOGY CONSULT NOTE  Patient ID: Evan [name] MRN: [id number] DOB/AGE: 12/28/1917 83 y.o.  Admit date: 03/13/2017 Referring Physician Anselm Jungling Primary Physician [facility] Primary Cardiologist [facility] Reason for Consultation Chest pain   HPI: 83 year old gentleman referred for chest pain. Patient has a history of CAD status post MI and 2 stents in 1997 in [place], hypertension, hyperlipidemia, CVA, GERD, and type II diabetes. Patient reports acute onset of central to left side chest pressure last night upon lying down without associated radiation, diaphoresis, nausea, or shortness of breath. He did not take nitroglycerin as he was out of his prescription. He denies known aggravating factors. The pain persisted throughout the night and prevented him from resting, and gradually eased off early this morning. He states that pain was similar to when he had the MI. Admission labs notable for borderline elevated initial troponin of 0.04, creatinine 2.23, BUN 40. ECG revealed normal sinus rhythm with ST-T wave depressions in inferolateral leads, consistent with prior ECG. Currently, the patient denies chest pain. He has mild exertional shortness of breath, which is unchanged. He has mild peripheral edema. He denies palpitations.   Review of systems complete and found to be negative unless listed above     Past Medical History:  Diagnosis Date  . Arthritis   . BPH (benign prostatic hypertrophy)   . CAD (coronary artery disease)   . Chronic kidney disease, stage III (moderate)   . CVA (cerebral vascular accident) ([place])   . Degenerative disc disease   . Diabetes mellitus   . Gastroenteritis   . GERD (gastroesophageal reflux disease)   . Hyperlipidemia   . Hypertension   . Polymyalgia rheumatica (HCC)     Past Surgical History:  Procedure Laterality Date  . ANGIOPLASTY    . CATARACT EXTRACTION  1999 1992   glaucoma  . ESOPHAGEAL DILATION    . ESOPHAGOGASTRODUODENOSCOPY  N/A 04/09/2015   Procedure: ESOPHAGOGASTRODUODENOSCOPY (EGD);  Surgeon: Josefine Class, MD;  Location: [facility] ENDOSCOPY;  Service: Endoscopy;  Laterality: N/A;  . SAVORY DILATION N/A 04/09/2015   Procedure: SAVORY DILATION;  Surgeon: Josefine Class, MD;  Location: [facility] ENDOSCOPY;  Service: Endoscopy;  Laterality: N/A;  . US ECHOCARDIOGRAPHY  04/2004   benign    Prescriptions Prior to Admission  Medication Sig Dispense Refill Last Dose  . aspirin EC 81 MG tablet Take 81 mg by mouth daily.   03/12/2017 at 0800  . bimatoprost (LUMIGAN) 0.01 % SOLN Place 1 drop into both eyes at bedtime.   03/12/2017 at 200  . fluticasone (FLONASE) 50 MCG/ACT nasal spray Place 1 spray into both nostrils daily.   PRN at PRN  . gabapentin (NEURONTIN) 100 MG capsule Take 1 capsule in the morning and 2 capsules at bedtime. 270 capsule 3 03/12/2017 at 2000  . glipiZIDE (GLUCOTROL) 5 MG tablet Take 1 tablet (5 mg total) by mouth daily. 90 tablet 3 03/12/2017 at 0800  . hydrALAZINE (APRESOLINE) 100 MG tablet Take 1 tablet (100 mg total) by mouth 4 (four) times daily. 120 tablet 0 03/12/2017 at 2000  . insulin lispro (HUMALOG) 100 UNIT/ML injection Inject 2 Units into the skin 3 (three) times daily before meals. Pt uses per sliding scale.   03/12/2017 at 0800  . lisinopril (PRINIVIL,ZESTRIL) 10 MG tablet Take 1 tablet (10 mg total) by mouth daily. 90 tablet 3 03/12/2017 at 0800  . omeprazole (PRILOSEC) 40 MG capsule Take 1 capsule (40 mg total) by mouth daily. 90 capsule 3 03/12/2017 at  0800  . polyethylene glycol (MIRALAX / GLYCOLAX) packet Take 17 g by mouth daily. Every other day   03/12/2017 at Unknown time  . pravastatin (PRAVACHOL) 40 MG tablet Take 1 tablet (40 mg total) by mouth daily. 90 tablet 3 03/12/2017 at 2000  . predniSONE (DELTASONE) 10 MG tablet Take 1 tablet (10 mg total) by mouth daily. 30 tablet 11 Past Week at Unknown time  . tamsulosin (FLOMAX) 0.4 MG CAPS capsule Take 1 capsule (0.4 mg total) by mouth daily. 90  capsule 3 03/12/2017 at 0800  . timolol (TIMOPTIC) 0.5 % ophthalmic solution Place 1 drop into both eyes daily.   03/12/2017 at Unknown time  . traMADol (ULTRAM) 50 MG tablet TAKE  (1)  TABLET  THREE TIMES DAILY AS NEEDED FOR PAIN. 30 tablet 0 PRN at PRN  . verapamil (CALAN) 40 MG tablet TAKE 1 TABLET TWICE A DAY 180 tablet 1 03/12/2017 at 2000  . Blood Glucose Monitoring Suppl (ONE TOUCH ULTRA 2) w/Device KIT 1 Device by Does not apply route once. Diagnosis: Diabetes Type 2 DX Code: E11.40 1 each 0 Taking  . GLUCERNA (GLUCERNA) LIQD Take 237 mLs by mouth 2 (two) times daily between meals.   Taking  . glucose blood (ONE TOUCH TEST STRIPS) test strip Test blood sugar 3 times a day. 100 each 5 Taking  . nitroGLYCERIN (NITROSTAT) 0.4 MG SL tablet Place 1 tablet (0.4 mg total) under the tongue every 5 (five) minutes as needed for chest pain. (Patient not taking: Reported on 03/13/2017) 25 tablet 2 Not Taking at Unknown time  . ONETOUCH DELICA LANCETS 50N MISC 1 strip by Does not apply route 3 (three) times daily with meals. Diagnosis: Diabetes Type 2 DX Code: E11.40 100 each 5 Taking   Social History   Social History  . Marital status: Widowed    Spouse name: N/A  . Number of children: N/A  . Years of education: N/A   Occupational History  . retired    Social History Main Topics  . Smoking status: Former Research scientist (life sciences)  . Smokeless tobacco: Never Used     Comment: quit over 20 years ago  . Alcohol use No  . Drug use: No  . Sexual activity: Not on file   Other Topics Concern  . Not on file   Social History Narrative   Religion affecting care--involved with Church throughout life (deacon, etc)      Has living will    Requests son [name]  as his health care power of attorney   Requests DNR--- done 10/15/14   No tube feeds if cognitively unaware             Family History  Problem Relation Age of Onset  . Constipation Mother   . Diabetes Maternal Uncle       Review of systems complete and  found to be negative unless listed above      PHYSICAL EXAM  General: Well developed, well nourished, in no acute distress HEENT:  Normocephalic and atramatic Neck:  No JVD.  Lungs: Clear bilaterally to auscultation Heart: HRRR . Normal S1 and S2 without gallops or murmurs.  Abdomen: Bowel sounds are positive, abdomen soft and non-tender  Msk:  Lying in bed, able to sit upright. Extremities: Pedal edema bilaterally, R>L. Negative Homan's sign Neuro: Alert and oriented X 3. Psych:  Good affect, responds appropriately  Labs:   Lab Results  Component Value Date   WBC 7.6 03/13/2017   HGB 10.2 (L)  03/13/2017   HCT 30.9 (L) 03/13/2017   MCV 88.7 03/13/2017   PLT 137 (L) 03/13/2017    Recent Labs Lab 03/13/17 0833  NA 141  K 4.1  CL 108  CO2 26  BUN 40*  CREATININE 2.23*  CALCIUM 8.4*  GLUCOSE 143*   Lab Results  Component Value Date   CKTOTAL 74 07/26/2012   CKMB 2.1 07/26/2012   TROPONINI 0.04 (HH) 03/13/2017    Lab Results  Component Value Date   CHOL 130 12/26/2016   CHOL 148 06/20/2016   CHOL 114 05/01/2015   Lab Results  Component Value Date   HDL 48.50 12/26/2016   HDL 47.80 06/20/2016   HDL 36.00 (L) 05/01/2015   Lab Results  Component Value Date   LDLCALC 63 12/26/2016   LDLCALC 74 06/20/2016   LDLCALC 65 05/01/2015   Lab Results  Component Value Date   TRIG 93.0 12/26/2016   TRIG 133.0 06/20/2016   TRIG 67.0 05/01/2015   Lab Results  Component Value Date   CHOLHDL 3 12/26/2016   CHOLHDL 3 06/20/2016   CHOLHDL 3 05/01/2015   No results found for: LDLDIRECT    Radiology: Ct Abdomen Pelvis Wo Contrast  Result Date: 02/17/2017 CLINICAL DATA:  Lower abdominal pain and generalized weakness. Nausea. EXAM: CT ABDOMEN AND PELVIS WITHOUT CONTRAST TECHNIQUE: Multidetector CT imaging of the abdomen and pelvis was performed following the standard protocol without IV contrast. COMPARISON:  03/08/2015 FINDINGS: Lower chest: Similar appearance of  subpleural reticulation in both lung bases compatible with chronic interstitial lung disease. No pleural effusion. Right coronary artery calcified atherosclerosis. Hepatobiliary: Calcified granulomas in the liver. Unchanged tiny stone in the gallbladder without evidence of acute cholecystitis. No biliary dilatation. Pancreas: Unchanged pancreatic atrophy. Mild ductal dilatation at the level of the pancreatic tail is grossly unchanged but better demonstrated on the prior contrast-enhanced study. No acute inflammatory changes. Spleen: Unchanged 2.5 cm low-density lesion in the spleen compatible with a cyst. Adrenals/Urinary Tract: Unremarkable adrenal glands. Unchanged left renal atrophy. Low-density right renal lesions measure up to 3.5 cm, unchanged and compatible with cysts. An 8 mm hyperattenuating lesion in the upper pole of the right kidney likely represents a proteinaceous or hemorrhagic cyst. No renal calculi or hydronephrosis. Unremarkable bladder. Stomach/Bowel: The stomach is within normal limits. Oral contrast is present in multiple nondilated loops of small bowel. There is no evidence of bowel obstruction. There is extensive sigmoid colon diverticulosis without evidence of acute diverticulitis. The appendix is unremarkable. Vascular/Lymphatic: Moderate abdominal aortic atherosclerosis without aneurysm. No enlarged lymph nodes. Reproductive: Mild prostatic enlargement. Unchanged mild enlargement of the right seminal vesicle. Other: No intraperitoneal free fluid. No abdominal wall mass or hernia. Musculoskeletal: Mild lumbar facet arthrosis. No acute osseous abnormality or suspicious osseous lesion. IMPRESSION: 1. No acute abnormality identified in the abdomen or pelvis. 2. Cholelithiasis. 3. Colonic diverticulosis. 4. Aortic atherosclerosis. Electronically Signed   By: Sebastian Ache M.D.   On: 02/17/2017 11:16   Dg Chest 1 View  Result Date: 03/13/2017 CLINICAL DATA:  Chest pain and weakness. EXAM: CHEST  1 VIEW COMPARISON:  02/17/2017 FINDINGS: The cardiac silhouette is upper limits of normal in size. Tortuosity and atherosclerotic calcification are again noted of the thoracic aorta. The lungs are slightly hyperinflated with similar appearance of mild basilar predominant reticular opacities which may reflect underlying interstitial lung disease. No confluent airspace opacity, overt pulmonary edema, sizable pleural effusion, or pneumothorax is identified. Soft tissue calcification in the right axilla is unchanged. No  acute osseous abnormality is seen. IMPRESSION: Chronic lung changes without evidence of acute abnormality. Electronically Signed   By: Logan Bores M.D.   On: 03/13/2017 10:00   Dg Chest 2 View  Result Date: 02/17/2017 CLINICAL DATA:  Lower abdominal pain and generalized weakness. Nausea. EXAM: CHEST  2 VIEW COMPARISON:  02/04/2016 FINDINGS: The cardiac silhouette is normal in size. Mild tortuosity and atherosclerotic calcification are again seen involving the thoracic aorta. Mild reticular opacities in both lung bases are unchanged and may reflect interstitial lung disease/ fibrosis. No confluent airspace opacity, overt pulmonary edema, pleural effusion, or pneumothorax is identified. No acute osseous abnormality is seen. IMPRESSION: 1. No active cardiopulmonary disease. 2. Aortic atherosclerosis. Electronically Signed   By: Logan Bores M.D.   On: 02/17/2017 10:32   US Abdomen Limited Ruq  Result Date: 02/17/2017 CLINICAL DATA:  Abdominal pain EXAM: US ABDOMEN LIMITED - RIGHT UPPER QUADRANT COMPARISON:  CT scan 02/17/2017. FINDINGS: Gallbladder: 6 mm non mobile polypoid lesion along the gallbladder wall is most consistent with a cholesterol polyp. No gallbladder wall thickening. No pericholecystic fluid. The sonographer reports no sonographic Murphy sign. Common bile duct: Diameter: 2 mm Liver: No focal abnormality. IMPRESSION: Tiny cholesterol polyp in the gallbladder. Electronically Signed    By: Misty Stanley M.D.   On: 02/17/2017 12:06    EKG: Sinus bradycardia, 57 bpm  ASSESSMENT AND PLAN:  1. Chest pain, with known CAD, with borderline elevated troponin of 0.04 with inferolateral ST depression, which appears to be chronic, currently without chest pain. 2. Hypertension    Recommendations: 1. Agree with current therapy. 2. Review echocardiogram 3. Continue to cycle cardiac enzymes 4. Continue heparin for now 5. Recommend initial conservative therapy given patient's advanced age.    Signed: Clabe Seal, PA-C 03/13/2017, 1:38 PM

## 2017-03-13 NOTE — Progress Notes (Signed)
Patient is a [age] years who looks younger compare to his appearance. Patient lost his wife in 2008 after 5966 years of marriage. Patient said that his children are supportive and they have been visiting him while he was at home.   03/13/17 1434  Clinical Encounter Type  Visited With Patient  Visit Type Initial  Spiritual Encounters  Spiritual Needs Emotional  Advance Directives (For Healthcare)  Does Patient Have a Medical Advance Directive? Yes  Does patient want to make changes to medical advance directive? No - Patient declined  Type of Estate agentAdvance Directive Healthcare Power of [place]ai[place]orney;Living will  Copy of Healthcare Power of Attorney in Chart? No - copy requested  Copy of Living Will in Chart? No - copy requested

## 2017-03-13 NOTE — H&P (Signed)
[place] [facility] [place] NAME: Evan [name]    MR#:  [id number]  DATE OF BIRTH:  Mar 25, 1918  DATE OF ADMISSION:  03/13/2017  PRIMARY CARE PHYSICIAN: Venia Carbon, MD   REQUESTING/REFERRING PHYSICIAN: McShane  CHIEF COMPLAINT:   Chief Complaint  Patient presents with  . Weakness    HISTORY OF PRESENT ILLNESS: Evan [name]  is a 83 y.o. male with a known history of Arthritis, BPH, coronary artery disease, chronic kidney disease, cerebrovascular accident, degenerative disc disease, diabetes, gastroesophageal reflux disease, hypertension, hyperlipidemia- lives alone and walks with a walker and independent in his day-to-day activities. Came to emergency room today early morning because last night when he was asleep he woke up with pain in his central chest which was sharp and constant, nonradiating, no exacerbating or relieving factors, associated with some shortness of breath. Up to early morning he just waited in the bed and the pain slowly subsided so finally decided to come to emergency room to have evaluation for this. He claims this pain was somewhat similar as he had with his heart attack in 1997. He had mild chest pains intermittently in past but nothing was so severe like this.  PAST MEDICAL HISTORY:   Past Medical History:  Diagnosis Date  . Arthritis   . BPH (benign prostatic hypertrophy)   . CAD (coronary artery disease)   . Chronic kidney disease, stage III (moderate)   . CVA (cerebral vascular accident) ([place])   . Degenerative disc disease   . Diabetes mellitus   . Gastroenteritis   . GERD (gastroesophageal reflux disease)   . Hyperlipidemia   . Hypertension   . Polymyalgia rheumatica ([place])     PAST SURGICAL HISTORY: Past Surgical History:  Procedure Laterality Date  . ANGIOPLASTY    . CATARACT EXTRACTION  1999 1992   glaucoma  . ESOPHAGEAL DILATION    . ESOPHAGOGASTRODUODENOSCOPY N/A 04/09/2015   Procedure:  ESOPHAGOGASTRODUODENOSCOPY (EGD);  Surgeon: Josefine Class, MD;  Location: [facility] ENDOSCOPY;  Service: Endoscopy;  Laterality: N/A;  . SAVORY DILATION N/A 04/09/2015   Procedure: SAVORY DILATION;  Surgeon: Josefine Class, MD;  Location: [facility] ENDOSCOPY;  Service: Endoscopy;  Laterality: N/A;  . US ECHOCARDIOGRAPHY  04/2004   benign    SOCIAL HISTORY:  Social History  Substance Use Topics  . Smoking status: Former Research scientist (life sciences)  . Smokeless tobacco: Never Used     Comment: quit over 20 years ago  . Alcohol use No    FAMILY HISTORY:  Family History  Problem Relation Age of Onset  . Constipation Mother   . Diabetes Maternal Uncle     DRUG ALLERGIES:  Allergies  Allergen Reactions  . Penicillins Rash and Other (See Comments)    Has patient had a PCN reaction causing immediate rash, facial/tongue/throat swelling, SOB or lightheadedness with hypotension: No Has patient had a PCN reaction causing severe rash involving mucus membranes or skin necrosis: No Has patient had a PCN reaction that required hospitalization No Has patient had a PCN reaction occurring within the last 10 years: No If all of the above answers are "NO", then may proceed with Cephalosporin use.     REVIEW OF SYSTEMS:   CONSTITUTIONAL: No fever, fatigue or weakness.  EYES: No blurred or double vision.  EARS, NOSE, AND THROAT: No tinnitus or ear pain.  RESPIRATORY: No cough, shortness of breath, wheezing or hemoptysis.  CARDIOVASCULAR: positive for chest pain,no orthopnea, edema.  GASTROINTESTINAL: No nausea, vomiting,  diarrhea or abdominal pain.  GENITOURINARY: No dysuria, hematuria.  ENDOCRINE: No polyuria, nocturia,  HEMATOLOGY: No anemia, easy bruising or bleeding SKIN: No rash or lesion. MUSCULOSKELETAL: No joint pain or arthritis.   NEUROLOGIC: No tingling, numbness, weakness.  PSYCHIATRY: No anxiety or depression.   MEDICATIONS AT HOME:  Prior to Admission medications   Medication Sig Start Date End  Date Taking? Authorizing Provider  aspirin EC 81 MG tablet Take 81 mg by mouth daily.   Yes [provider]  bimatoprost (LUMIGAN) 0.01 % SOLN Place 1 drop into both eyes at bedtime.   Yes [provider]  fluticasone (FLONASE) 50 MCG/ACT nasal spray Place 1 spray into both nostrils daily.   Yes [provider]  gabapentin (NEURONTIN) 100 MG capsule Take 1 capsule in the morning and 2 capsules at bedtime. 01/31/17  Yes Venia Carbon, MD  glipiZIDE (GLUCOTROL) 5 MG tablet Take 1 tablet (5 mg total) by mouth daily. 01/31/17  Yes Venia Carbon, MD  hydrALAZINE (APRESOLINE) 100 MG tablet Take 1 tablet (100 mg total) by mouth 4 (four) times daily. 11/14/16  Yes Venia Carbon, MD  insulin lispro (HUMALOG) 100 UNIT/ML injection Inject 2 Units into the skin 3 (three) times daily before meals. Pt uses per sliding scale.   Yes [provider]  lisinopril (PRINIVIL,ZESTRIL) 10 MG tablet Take 1 tablet (10 mg total) by mouth daily. 01/31/17  Yes Venia Carbon, MD  omeprazole (PRILOSEC) 40 MG capsule Take 1 capsule (40 mg total) by mouth daily. 12/26/16  Yes Venia Carbon, MD  polyethylene glycol (MIRALAX / GLYCOLAX) packet Take 17 g by mouth daily. Every other day   Yes [provider]  pravastatin (PRAVACHOL) 40 MG tablet Take 1 tablet (40 mg total) by mouth daily. 01/31/17  Yes Venia Carbon, MD  predniSONE (DELTASONE) 10 MG tablet Take 1 tablet (10 mg total) by mouth daily. 05/20/16  Yes Venia Carbon, MD  tamsulosin (FLOMAX) 0.4 MG CAPS capsule Take 1 capsule (0.4 mg total) by mouth daily. 01/31/17  Yes Venia Carbon, MD  timolol (TIMOPTIC) 0.5 % ophthalmic solution Place 1 drop into both eyes daily.   Yes [provider]  traMADol (ULTRAM) 50 MG tablet TAKE  (1)  TABLET  THREE TIMES DAILY AS NEEDED FOR PAIN. 11/10/16  Yes Venia Carbon, MD  verapamil (CALAN) 40 MG tablet TAKE 1 TABLET TWICE A DAY 09/05/16  Yes Venia Carbon,  MD  Blood Glucose Monitoring Suppl (ONE TOUCH ULTRA 2) w/Device KIT 1 Device by Does not apply route once. Diagnosis: Diabetes Type 2 DX Code: E11.40 01/14/16   Venia Carbon, MD  GLUCERNA (GLUCERNA) LIQD Take 237 mLs by mouth 2 (two) times daily between meals.    [provider]  glucose blood (ONE TOUCH TEST STRIPS) test strip Test blood sugar 3 times a day. 01/14/16   Venia Carbon, MD  nitroGLYCERIN (NITROSTAT) 0.4 MG SL tablet Place 1 tablet (0.4 mg total) under the tongue every 5 (five) minutes as needed for chest pain. Patient not taking: Reported on 03/13/2017 03/17/16   Venia Carbon, MD  [facility] DELICA LANCETS 24M MISC 1 strip by Does not apply route 3 (three) times daily with meals. Diagnosis: Diabetes Type 2 DX Code: E11.40 01/14/16   Viviana Simpler I, MD      PHYSICAL EXAMINATION:   VITAL SIGNS: Blood pressure (!) 187/91, pulse 66, temperature 98.2 F (36.8 C), temperature source Oral, resp.  rate 14, height _0  (1.905 m), weight 74.8 kg (165 lb), SpO2 93 %.  GENERAL:  83 y.o.-year-old patient lying in the bed with no acute distress.  EYES: Pupils equal, round, reactive to light and accommodation. No scleral icterus. Extraocular muscles intact.  HEENT: Head atraumatic, normocephalic. Oropharynx and nasopharynx clear.  NECK:  Supple, no jugular venous distention. No thyroid enlargement, no tenderness.  LUNGS: Normal breath sounds bilaterally, no wheezing, rales,rhonchi or crepitation. No use of accessory muscles of respiration.  CARDIOVASCULAR: S1, S2 normal. No murmurs, rubs, or gallops.  ABDOMEN: Soft, nontender, nondistended. Bowel sounds present. No organomegaly or mass.  EXTREMITIES: No pedal edema, cyanosis, or clubbing.  NEUROLOGIC: Cranial nerves II through XII are intact. Muscle strength 5/5 in all extremities. Sensation intact. Gait not checked.  PSYCHIATRIC: The patient is alert and oriented x 3.  SKIN: No obvious rash, lesion, or ulcer.   LABORATORY  PANEL:   CBC  Recent Labs Lab 03/13/17 0833  WBC 7.6  HGB 10.2*  HCT 30.9*  PLT 137*  MCV 88.7  MCH 29.3  MCHC 33.1  RDW 12.2   ------------------------------------------------------------------------------------------------------------------  Chemistries   Recent Labs Lab 03/13/17 0833  NA 141  K 4.1  CL 108  CO2 26  GLUCOSE 143*  BUN 40*  CREATININE 2.23*  CALCIUM 8.4*   ------------------------------------------------------------------------------------------------------------------ estimated creatinine clearance is 20.5 mL/min (A) (by C-G formula based on SCr of 2.23 mg/dL (H)). ------------------------------------------------------------------------------------------------------------------ No results for input(s): TSH, T4TOTAL, T3FREE, THYROIDAB in the last 72 hours.  Invalid input(s): FREET3   Coagulation profile No results for input(s): INR, PROTIME in the last 168 hours. ------------------------------------------------------------------------------------------------------------------- No results for input(s): DDIMER in the last 72 hours. -------------------------------------------------------------------------------------------------------------------  Cardiac Enzymes No results for input(s): CKMB, TROPONINI, MYOGLOBIN in the last 168 hours.  Invalid input(s): CK ------------------------------------------------------------------------------------------------------------------ Invalid input(s): POCBNP  ---------------------------------------------------------------------------------------------------------------  Urinalysis    Component Value Date/Time   COLORURINE YELLOW (A) 03/13/2017 1033   APPEARANCEUR CLEAR (A) 03/13/2017 1033   APPEARANCEUR Cloudy 05/31/2014 1505   LABSPEC 1.011 03/13/2017 1033   LABSPEC 1.014 05/31/2014 1505   PHURINE 6.0 03/13/2017 1033   GLUCOSEU 50 (A) 03/13/2017 1033   GLUCOSEU Negative 05/31/2014 1505   HGBUR  NEGATIVE 03/13/2017 1033   HGBUR trace-lysed 07/28/2008 [place] 03/13/2017 1033   BILIRUBINUR Negative 05/31/2014 1505   KETONESUR NEGATIVE 03/13/2017 1033   PROTEINUR 30 (A) 03/13/2017 1033   UROBILINOGEN 1.0 06/26/2010 1600   NITRITE NEGATIVE 03/13/2017 1033   LEUKOCYTESUR NEGATIVE 03/13/2017 1033   LEUKOCYTESUR 3+ 05/31/2014 1505     RADIOLOGY: Dg Chest 1 View  Result Date: 03/13/2017 CLINICAL DATA:  Chest pain and weakness. EXAM: CHEST 1 VIEW COMPARISON:  02/17/2017 FINDINGS: The cardiac silhouette is upper limits of normal in size. Tortuosity and atherosclerotic calcification are again noted of the thoracic aorta. The lungs are slightly hyperinflated with similar appearance of mild basilar predominant reticular opacities which may reflect underlying interstitial lung disease. No confluent airspace opacity, overt pulmonary edema, sizable pleural effusion, or pneumothorax is identified. Soft tissue calcification in the right axilla is unchanged. No acute osseous abnormality is seen. IMPRESSION: Chronic lung changes without evidence of acute abnormality. Electronically Signed   By: Logan Bores M.D.   On: 03/13/2017 10:00    EKG: Orders placed or performed during the hospital encounter of 03/13/17  . ED EKG  . ED EKG  . EKG 12-Lead  . EKG 12-Lead     EKG revealed have some ST-T changes  involving [place] to depression and T-wave inversions in inferior and lateral chest leads but this appears to be chronic as compared to EKG 1 year ago.  IMPRESSION AND PLAN:  * Chest pain   We'll monitor on telemetry, follow serial troponin, get echocardiogram.   Will check lipid panel.   Patient is already taking aspirin, but he is not on beta blockers or isosorbide.   We may have to add that at the time of discharge, I will call cardiologist to come and follow in the hospital.   Because of very old age he may not be a candidate for more invasive workup, I discussed the case with  cardiologist.  * History of coronary artery disease   Continue aspirin, check lipid panel, may have to add beta blockers  * Hypertension   He takes hydralazine, ACE inhibitor.   I will stop both of those as he has chronic renal failure and creatinine is high   I will start him on metoprolol and isosorbide instead.  * Diabetes   Taking glipizide, will keep on insulin sliding scale coverage.  * Hyperlipidemia   Continue pravastatin, check lipid panel.     All the records are reviewed and case discussed with ED provider. Management plans discussed with the patient, family and they are in agreement.  CODE STATUS:Limited code, he confirms his son is power of attorney, on further questioning he denies using intubation or ventilator support but he would like to have CPR, cardioversion, noninvasive ventilator and medications if he is cardiologist.  Code Status History    Date Active Date Inactive Code Status Order ID Comments User Context   02/05/2016  5:41 AM 02/06/2016  5:07 PM Full Code [id number]  Sylvan Cheese, MD Inpatient   09/02/2015  6:58 PM 09/05/2015  3:42 PM Full Code [id number]  Epifanio Lesches, MD ED       TOTAL TIME TAKING CARE OF THIS PATIENT: 40  minutes.    [name] M.D on 03/13/2017   Between 7am to 6pm - Pager - [phone]  After 6pm go to [email or web address] - password [email or web address] [facility]  Office  [phone]  CC: Primary care physician; Venia Carbon, MD   Note: This dictation was prepared with Dragon dictation along with smaller phrase technology. Any transcriptional errors that result from this process are unintentional.

## 2017-03-13 NOTE — ED Notes (Signed)
Pt states he is not having any pain, just "soreness" in chest "from where it hurt all night." 5/10

## 2017-03-13 NOTE — Progress Notes (Signed)
Family Meeting Note  Advance Directive:yes  Today a meeting took place with the Patient.  The following clinical team members were present during this meeting:MD  The following were discussed:Patient's diagnosis: Chest pain, CAD, DM, Htn, Patient's progosis: Unable to determine and Goals for treatment: Continue present management   pt will not want intubation or ventilator support, but he will like to have CPR, cardioversion and meds- if he have cardiac arrest. Additional follow-up to be provided: cardiologist consult.  Time spent during discussion:20 minutes  Jennife Zaucha, Heath GoldVAIBHAVKUMAR, MD

## 2017-03-13 NOTE — ED Notes (Signed)
Date and time results received: 03/13/17 1251 (use smartphrase ".now" to insert current time)  Test: Troponin Critical Value: 0.04  Name of Provider Notified: willams  Orders Received? Or Actions Taken?:

## 2017-03-14 ENCOUNTER — Encounter: Payer: Self-pay | Admitting: *Deleted

## 2017-03-14 LAB — BASIC METABOLIC PANEL
Anion gap: 5 (ref 5–15)
BUN: 40 mg/dL — ABNORMAL HIGH (ref 6–20)
CHLORIDE: 111 mmol/L (ref 101–111)
CO2: 25 mmol/L (ref 22–32)
CREATININE: 2.14 mg/dL — AB (ref 0.61–1.24)
Calcium: 8.1 mg/dL — ABNORMAL LOW (ref 8.9–10.3)
GFR calc Af Amer: 28 mL/min — ABNORMAL LOW (ref >60)
GFR calc non Af Amer: 24 mL/min — ABNORMAL LOW (ref >60)
GLUCOSE: 118 mg/dL — AB (ref 65–99)
Potassium: 4.1 mmol/L (ref 3.5–5.1)
Sodium: 141 mmol/L (ref 135–145)

## 2017-03-14 LAB — CBC
HCT: 28.6 % — ABNORMAL LOW (ref 40.0–52.0)
HEMOGLOBIN: 9.3 g/dL — AB (ref 13.0–18.0)
MCH: 28.9 pg (ref 26.0–34.0)
MCHC: 32.7 g/dL (ref 32.0–36.0)
MCV: 88.5 fL (ref 80.0–100.0)
PLATELETS: 131 10*3/uL — AB (ref 150–440)
RBC: 3.23 MIL/uL — ABNORMAL LOW (ref 4.40–5.90)
RDW: 12.1 % (ref 11.5–14.5)
WBC: 7 10*3/uL (ref 3.8–10.6)

## 2017-03-14 LAB — ECHOCARDIOGRAM COMPLETE
E decel time: 236 msec
E/e' ratio: 11.24
FS: 25 % — AB (ref 28–44)
HEIGHTINCHES: 75 in
IV/PV OW: 1.1
LA diam end sys: 35 mm
LA diam index: 1.77 cm/m2
LA vol A4C: 49.9 ml
LASIZE: 35 mm
LAVOL: 67.2 mL
LAVOLIN: 33.9 mL/m2
LV E/e' medial: 11.24
LV TDI E'LATERAL: 5.87
LV e' LATERAL: 5.87 cm/s
LVEEAVG: 11.24
Lateral S' vel: 9.68 cm/s
MV Dec: 236
MV pk A vel: 97.3 m/s
MV pk E vel: 66 m/s
MVAP: 3.19 cm2
MVSPHT: 69 ms
P 1/2 time: 609 ms
PW: 13.2 mm — AB (ref 0.6–1.1)
TAPSE: 21.2 mm
TDI e' medial: 4.13
WEIGHTICAEL: 2640 [oz_av]

## 2017-03-14 LAB — GLUCOSE, CAPILLARY
Glucose-Capillary: 98 mg/dL (ref 65–99)
Glucose-Capillary: 136 mg/dL — ABNORMAL HIGH (ref 65–99)
Glucose-Capillary: 206 mg/dL — ABNORMAL HIGH (ref 65–99)

## 2017-03-14 NOTE — Evaluation (Signed)
Physical Therapy Evaluation Patient Details Name: Evan [name] MRN: [id number] DOB: 05/03/1918 Today's Date: 03/14/2017   History of Present Illness  83 y.o. male with a known history of Arthritis, BPH, coronary artery disease, chronic kidney disease, cerebrovascular accident, degenerative disc disease, diabetes, gastroesophageal reflux disease, hypertension, hyperlipidemia- lives alone and walks with a walker and independent in his day-to-day activities.  Clinical Impression  Pt showed good confidence and safety with ambulation.  He did have some R toe drag with increased distance (reports he rarely walks more than what we did today, ~125 ft w/ stairs).  He is not quite to his baseline, but generally did very well and had age appropriate mobility and safety t/o the PT session.  Pt's O2 and HR remained stable the entire time and ultimately pt did very well and should be able to safely return home with HHPT and current support system.     Follow Up Recommendations Home health PT    Equipment Recommendations       Recommendations for Other Services       Precautions / Restrictions Precautions Precautions: Fall Restrictions Weight Bearing Restrictions: No      Mobility  Bed Mobility Overal bed mobility: Independent             General bed mobility comments: Pt able to get up to sitting w/o assist  Transfers Overall transfer level: Independent Equipment used: Rolling walker (2 wheeled)             General transfer comment: Pt able to rise w/o assist, showed good confidence and safety  Ambulation/Gait Ambulation/Gait assistance: Supervision Ambulation Distance (Feet): 125 Feet Assistive device: Rolling walker (2 wheeled)       General Gait Details: Pt with good confidence with ambualtion, did have some R foot dragging as time walking pregressed.  He did not have excessive fatigue but reports he does not generally walk much more than that at a time.  HR stays below  75 and O2 above 95% the entire time.  Stairs Stairs: Yes Stairs assistance: Modified independent (Device/Increase time) Stair Management: One rail Left Number of Stairs: 3 General stair comments: Pt able to negotiate steps safely and with good confidence.  Wheelchair Mobility    Modified Rankin (Stroke Patients Only)       Balance Overall balance assessment: Modified Independent                                           Pertinent Vitals/Pain Pain Assessment: No/denies pain    Home Living Family/patient expects to be discharged to:: Private residence Living Arrangements: Alone Available Help at Discharge: Personal care attendant;Friend(s) (has 3 hr/day 6d/wk)   Home Access: Stairs to enter Entrance Stairs-Rails: Left Entrance Stairs-Number of Steps: 2 Home Layout:  (1 step down to den) Home Equipment: Environmental consultantWalker - 4 wheels      Prior Function Level of Independence: Independent with assistive device(s)         Comments: Pt reports he does not drive, but regularly goes out with friends, able to be active     Hand Dominance        Extremity/Trunk Assessment   Upper Extremity Assessment Upper Extremity Assessment: Generalized weakness;Overall WFL for tasks assessed (age appropriate defecits)    Lower Extremity Assessment Lower Extremity Assessment: Generalized weakness;Overall WFL for tasks assessed (age appropriate defecits (R lower leg neuropathy))  Cervical / Trunk Assessment Cervical / Trunk Assessment: Kyphotic  Communication   Communication: No difficulties  Cognition Arousal/Alertness: Awake/alert Behavior During Therapy: WFL for tasks assessed/performed Overall Cognitive Status: Within Functional Limits for tasks assessed                                        General Comments      Exercises     Assessment/Plan    PT Assessment Patient needs continued PT services  PT Problem List Decreased  strength;Decreased range of motion;Decreased activity tolerance;Decreased mobility;Decreased balance;Decreased coordination;Decreased safety awareness;Decreased knowledge of use of DME       PT Treatment Interventions DME instruction;Gait training;Stair training;Functional mobility training;Therapeutic activities;Therapeutic exercise;Balance training;Neuromuscular re-education;Patient/family education    PT Goals (Current goals can be found in the Care Plan section)  Acute Rehab PT Goals Patient Stated Goal: gp home PT Goal Formulation: With patient Time For Goal Achievement: 03/28/17 Potential to Achieve Goals: Good    Frequency Min 2X/week   Barriers to discharge        Co-evaluation               AM-PAC PT "6 Clicks" Daily Activity  Outcome Measure Difficulty turning over in bed (including adjusting bedclothes, sheets and blankets)?: None Difficulty moving from lying on back to sitting on the side of the bed? : A Little Difficulty sitting down on and standing up from a chair with arms (e.g., wheelchair, bedside commode, etc,.)?: A Little Help needed moving to and from a bed to chair (including a wheelchair)?: None Help needed walking in hospital room?: None Help needed climbing 3-5 steps with a railing? : A Little 6 Click Score: 21    End of Session Equipment Utilized During Treatment: Gait belt Activity Tolerance: Patient tolerated treatment well Patient left: with chair alarm set;with call bell/phone within reach;with family/visitor present Nurse Communication: Mobility status PT Visit Diagnosis: Muscle weakness (generalized) (M62.81);Difficulty in walking, not elsewhere classified (R26.2)    Time: 0865-7846 PT Time Calculation (min) (ACUTE ONLY): 26 min   Charges:   PT Evaluation $PT Eval Low Complexity: 1 Procedure     PT G Codes:   PT G-Codes **NOT FOR INPATIENT CLASS** Functional Assessment Tool Used: AM-PAC 6 Clicks Basic Mobility Functional  Limitation: Mobility: Walking and moving around Mobility: Walking and Moving Around Current Status (N6295): At least 20 percent but less than 40 percent impaired, limited or restricted Mobility: Walking and Moving Around Goal Status [phone]): At least 1 percent but less than 20 percent impaired, limited or restricted    Malachi Pro, DPT 03/14/2017, 1:49 PM

## 2017-03-14 NOTE — Progress Notes (Signed)
[facility] Cardiology  SUBJECTIVE: Patient denies chest pain or shortness of breath.    Vitals:   03/13/17 1336 03/13/17 1955 03/14/17 0322 03/14/17 0724  BP: (!) 151/74 (!) 179/95 (!) 146/63 (!) 119/49  Pulse: (!) 59 66 63 64  Resp: 18 16 16 17   Temp: 97.7 F (36.5 C) 98.3 F (36.8 C) 97.8 F (36.6 C) 98.1 F (36.7 C)  TempSrc: Oral Oral Oral Oral  SpO2: 93% 96% 94% 96%  Weight:      Height:         Intake/Output Summary (Last 24 hours) at 03/14/17 0816 Last data filed at 03/14/17 1610  Gross per 24 hour  Intake              360 ml  Output             1350 ml  Net             -990 ml      PHYSICAL EXAM  General: Well developed, well nourished, in no acute distress HEENT:  Normocephalic and atramatic Neck:  No JVD.  Lungs: Normal effort of breathing Heart: HRRR . Normal S1 and S2 without gallops or murmurs.  Abdomen: Bowel sounds are positive, abdomen soft and non-tender  Msk:  Lying in bed Extremities: 1+ bilateral pedal edema Neuro: Alert and oriented X 3. Psych:  Good affect, responds appropriately   LABS: Basic Metabolic Panel:  Recent Labs  96/04/54 0833 03/14/17 0430  NA 141 141  K 4.1 4.1  CL 108 111  CO2 26 25  GLUCOSE 143* 118*  BUN 40* 40*  CREATININE 2.23* 2.14*  CALCIUM 8.4* 8.1*   Liver Function Tests: No results for input(s): AST, ALT, ALKPHOS, BILITOT, PROT, ALBUMIN in the last 72 hours. No results for input(s): LIPASE, AMYLASE in the last 72 hours. CBC:  Recent Labs  03/13/17 0833 03/14/17 0430  WBC 7.6 7.0  HGB 10.2* 9.3*  HCT 30.9* 28.6*  MCV 88.7 88.5  PLT 137* 131*   Cardiac Enzymes:  Recent Labs  03/13/17 1208 03/13/17 1508 03/13/17 1724  TROPONINI 0.04* 0.03* 0.03*   BNP: Invalid input(s): POCBNP D-Dimer: No results for input(s): DDIMER in the last 72 hours. Hemoglobin A1C: No results for input(s): HGBA1C in the last 72 hours. Fasting Lipid Panel:  Recent Labs  03/13/17 1508  CHOL 91  HDL 31*  LDLCALC 46   TRIG 69  CHOLHDL 2.9   Thyroid Function Tests: No results for input(s): TSH, T4TOTAL, T3FREE, THYROIDAB in the last 72 hours.  Invalid input(s): FREET3 Anemia Panel: No results for input(s): VITAMINB12, FOLATE, FERRITIN, TIBC, IRON, RETICCTPCT in the last 72 hours.  Dg Chest 1 View  Result Date: 03/13/2017 CLINICAL DATA:  Chest pain and weakness. EXAM: CHEST 1 VIEW COMPARISON:  02/17/2017 FINDINGS: The cardiac silhouette is upper limits of normal in size. Tortuosity and atherosclerotic calcification are again noted of the thoracic aorta. The lungs are slightly hyperinflated with similar appearance of mild basilar predominant reticular opacities which may reflect underlying interstitial lung disease. No confluent airspace opacity, overt pulmonary edema, sizable pleural effusion, or pneumothorax is identified. Soft tissue calcification in the right axilla is unchanged. No acute osseous abnormality is seen. IMPRESSION: Chronic lung changes without evidence of acute abnormality. Electronically Signed   By: Sebastian Ache M.D.   On: 03/13/2017 10:00     Echo: Pending  TELEMETRY: Normal sinus rhythm, rate 71 bpm    ASSESSMENT AND PLAN:  Principal Problem:  Chest pain    1. Chest pain, with known CAD, with borderline elevated troponin, trended down to 0.03. Patient denies recurrence of chest pain.  2. Hypertension  Recommendations: 1. Agree with current therapy. 2. Discontinue heparin 3. Continue metoprolol and Imdur 4. Defer Plavix for now in light of patient's baseline anemia and advanced age 325. Increase activity and continue to watch today with possible discharge tomorrow   Leanora Ivanoffnna Ragan Duhon, PA-C 03/14/2017 8:16 AM

## 2017-03-14 NOTE — Plan of Care (Signed)
Problem: Safety: Goal: Ability to remain free from injury will improve Outcome: Progressing Fall precautions in place, non skid socks when oob  Problem: Pain Managment: Goal: General experience of comfort will improve Outcome: Progressing Prn medications  Problem: Activity: Goal: Risk for activity intolerance will decrease Outcome: Progressing Heparin SQ  Problem: Activity: Goal: Ability to tolerate increased activity will improve Outcome: Progressing Cardiology consult, 2decho pending

## 2017-03-14 NOTE — Progress Notes (Signed)
[facility] - [place] at [facility]   PATIENT NAME: Evan [name]    MR#:  [id number]  DATE OF BIRTH:  Dec 21, 1917  SUBJECTIVE:  CHIEF COMPLAINT:   Chief Complaint  Patient presents with  . Weakness   Fatigue. REVIEW OF SYSTEMS:  Review of Systems  Constitutional: Positive for malaise/fatigue. Negative for chills and fever.  HENT: Negative for congestion and hearing loss.   Eyes: Negative for blurred vision and double vision.  Respiratory: Negative for cough, shortness of breath, wheezing and stridor.   Cardiovascular: Negative for chest pain, palpitations and leg swelling.  Gastrointestinal: Negative for abdominal pain, blood in stool, constipation, diarrhea, melena, nausea and vomiting.  Genitourinary: Negative for dysuria and hematuria.  Musculoskeletal: Negative for back pain and myalgias.  Neurological: Negative for dizziness, focal weakness and weakness.  Psychiatric/Behavioral: The patient is not nervous/anxious.     DRUG ALLERGIES:   Allergies  Allergen Reactions  . Penicillins Rash and Other (See Comments)    Has patient had a PCN reaction causing immediate rash, facial/tongue/throat swelling, SOB or lightheadedness with hypotension: No Has patient had a PCN reaction causing severe rash involving mucus membranes or skin necrosis: No Has patient had a PCN reaction that required hospitalization No Has patient had a PCN reaction occurring within the last 10 years: No If all of the above answers are "NO", then may proceed with Cephalosporin use.    VITALS:  Blood pressure (!) 139/59, pulse 62, temperature 98.8 F (37.1 C), resp. rate 20, height 6\' 3"  (1.905 m), weight 165 lb (74.8 kg), SpO2 95 %. PHYSICAL EXAMINATION:  Physical Exam  Constitutional: He is oriented to person, place, and time and well-developed, well-nourished, and in no distress.  HENT:  Head: Normocephalic.  Mouth/Throat: Oropharynx is clear and moist.  Eyes: Conjunctivae and  EOM are normal. No scleral icterus.  Neck: Normal range of motion. Neck supple. No JVD present. No tracheal deviation present.  Cardiovascular: Normal rate, regular rhythm and normal heart sounds.  Exam reveals no gallop.   No murmur heard. Pulmonary/Chest: Effort normal and breath sounds normal. No respiratory distress. He has no wheezes. He has no rales.  Abdominal: Soft. Bowel sounds are normal. He exhibits no distension. There is no tenderness.  Musculoskeletal: Normal range of motion. He exhibits no edema or tenderness.  Neurological: He is alert and oriented to person, place, and time. No cranial nerve deficit.  Skin: No rash noted. No erythema.  Psychiatric: Affect normal.   LABORATORY PANEL:  Male CBC  Recent Labs Lab 03/14/17 0430  WBC 7.0  HGB 9.3*  HCT 28.6*  PLT 131*   ------------------------------------------------------------------------------------------------------------------ Chemistries   Recent Labs Lab 03/14/17 0430  NA 141  K 4.1  CL 111  CO2 25  GLUCOSE 118*  BUN 40*  CREATININE 2.14*  CALCIUM 8.1*   RADIOLOGY:  No results found. ASSESSMENT AND PLAN:   * Chest pain Stable serial troponin, unremarkable echocardiogram. normal lipid panel. Discontinue heparin continue aspirin, Continue metoprolol and Imdur, Defer Plavix for now in light of patient's baseline anemia and advanced age, Increase activity and continue to watch today with possible discharge tomorrow per cardiology consult.  * History of coronary artery disease continue aspirin, Continue metoprolol and Imdur,  * Hypertension  hydralazine, ACE inhibitor were stopped due to chronic renal failure and creatinine is high Started him on metoprolol and isosorbide instead.  * Diabetes   Taking glipizide, on insulin sliding scale coverage.  * Hyperlipidemia   Continue  pravastatin. normal lipid panel.  PT evaluation suggested home health with PT. All the records are reviewed and  case discussed with Care Management/Social Worker. Management plans discussed with the patient, family and they are in agreement.  CODE STATUS: Partial Code  TOTAL TIME TAKING CARE OF THIS PATIENT: 26 minutes.   More than 50% of the time was spent in counseling/coordination of care: YES  POSSIBLE D/C IN 1 DAYS, DEPENDING ON CLINICAL CONDITION.   Evan Pollackhen, Evan [name] M.D on 03/14/2017 at 5:33 PM  Between 7am to 6pm - Pager - [phone]  After 6pm go to [email or web address] - Social research officer, governmentpassword [email or web address]  [facility] [place] [facility]  Office  [phone]  CC: Primary care physician; Karie SchwalbeLetvak, Evan I, MD  Note: This dictation was prepared with Dragon dictation along with smaller phrase technology. Any transcriptional errors that result from this process are unintentional.

## 2017-03-14 NOTE — Plan of Care (Signed)
Problem: Pain Managment: Goal: General experience of comfort will improve Outcome: Progressing Pt with no complaints of pain, will continue to monitor.  Problem: Tissue Perfusion: Goal: Risk factors for ineffective tissue perfusion will decrease Outcome: Completed/Met Date Met: 03/14/17 Heparin subcutaneously for VTE.

## 2017-03-14 NOTE — Care Management (Signed)
Patient placed in observation for chest pain.  Lives at home alone.  Has many friends and family.  [facility] seven days a [facility]. Looking to have the hours increased.  Patient's friends and family transport him to appointments.  Has a walker at home.  Agreeable to home health physical therapy.  No agency preference.  [facility]erral called to [facility].

## 2017-03-15 LAB — GLUCOSE, CAPILLARY
GLUCOSE-CAPILLARY: 175 mg/dL — AB (ref 65–99)
Glucose-Capillary: 163 mg/dL — ABNORMAL HIGH (ref 65–99)
Glucose-Capillary: 105 mg/dL — ABNORMAL HIGH (ref 65–99)

## 2017-03-15 MED ORDER — ISOSORBIDE MONONITRATE ER 30 MG PO TB24: 30.0000 mg | ORAL_TABLET | Freq: Every day | ORAL | 2 refills | Status: DC

## 2017-03-15 MED ORDER — METOPROLOL TARTRATE 25 MG PO TABS: 25.0000 mg | ORAL_TABLET | Freq: Two times a day (BID) | ORAL | 2 refills | Status: DC

## 2017-03-15 NOTE — Plan of Care (Signed)
Problem: Pain Managment: Goal: General experience of comfort will improve Outcome: Completed/Met Date Met: 03/15/17 Pt with no complaints of pain, will continue to monitor.

## 2017-03-15 NOTE — Discharge Summary (Addendum)
[place] at [place] NAME: Evan [name]    MR#:  [id number]  [place] OF BIRTH:  04-Feb-1918  DATE OF ADMISSION:  03/13/2017   ADMITTING PHYSICIAN: [name], MD  DATE OF DISCHARGE:  03/15/2017 PRIMARY CARE PHYSICIAN: Venia Carbon, MD   ADMISSION DIAGNOSIS:  Weakness [R53.1] Chest pain, unspecified type [R07.9] DISCHARGE DIAGNOSIS:  Principal Problem:   Chest pain  SECONDARY DIAGNOSIS:   Past Medical History:  Diagnosis Date  . Arthritis   . BPH (benign prostatic hypertrophy)   . CAD (coronary artery disease)   . Chronic kidney disease, stage III (moderate)   . CVA (cerebral vascular accident) ([place])   . Degenerative disc disease   . Diabetes mellitus   . Gastroenteritis   . GERD (gastroesophageal reflux disease)   . Hyperlipidemia   . Hypertension   . Polymyalgia rheumatica ([place])    HOSPITAL COURSE:  * Chest pain, atypical. Stable serial troponin, unremarkable echocardiogram. normal lipid panel. Discontinue heparin continue aspirin, Continue metoprolol and Imdur, Defer Plavix for now in light of patient's baseline anemia and advanced age per cardiology consult.  * History of coronary artery disease continue aspirin, Continue metoprolol and Imdur,  * Hypertension Hydralazine and  ACE inhibitor were stopped due to chronic renal failure and creatinine is high Started him on metoprolol and isosorbide instead.  * Diabetes Taking glipizide, on insulin sliding scale coverage.  * Hyperlipidemia Continue pravastatin. normal lipid panel.  Weakness. PT evaluation suggested home health with PT.  DISCHARGE CONDITIONS:  Stable, discharge to home with HHPT. CONSULTS OBTAINED:  Treatment Team:  Isaias Cowman, MD DRUG ALLERGIES:   Allergies  Allergen Reactions  . Penicillins Rash and Other (See Comments)    Has patient had a PCN reaction causing immediate rash, facial/tongue/throat swelling, SOB  or lightheadedness with hypotension: No Has patient had a PCN reaction causing severe rash involving mucus membranes or skin necrosis: No Has patient had a PCN reaction that required hospitalization No Has patient had a PCN reaction occurring within the last 10 years: No If all of the above answers are "NO", then may proceed with Cephalosporin use.    DISCHARGE MEDICATIONS:   Allergies as of 03/15/2017      Reactions   Penicillins Rash, Other (See Comments)   Has patient had a PCN reaction causing immediate rash, facial/tongue/throat swelling, SOB or lightheadedness with hypotension: No Has patient had a PCN reaction causing severe rash involving mucus membranes or skin necrosis: No Has patient had a PCN reaction that required hospitalization No Has patient had a PCN reaction occurring within the last 10 years: No If all of the above answers are "NO", then may proceed with Cephalosporin use.      Medication List    STOP taking these medications   hydrALAZINE 100 MG tablet Commonly known as:  APRESOLINE   lisinopril 10 MG tablet Commonly known as:  PRINIVIL,ZESTRIL     TAKE these medications   aspirin EC 81 MG tablet Take 81 mg by mouth daily.   bimatoprost 0.01 % Soln Commonly known as:  LUMIGAN Place 1 drop into both eyes at bedtime.   fluticasone 50 MCG/ACT nasal spray Commonly known as:  FLONASE Place 1 spray into both nostrils daily.   gabapentin 100 MG capsule Commonly known as:  NEURONTIN Take 1 capsule in the morning and 2 capsules at bedtime.   glipiZIDE 5 MG tablet Commonly known as:  GLUCOTROL Take 1 tablet (5 mg  total) by mouth daily.   GLUCERNA Liqd Take 237 mLs by mouth 2 (two) times daily between meals.   glucose blood test strip Commonly known as:  ONE TOUCH TEST STRIPS Test blood sugar 3 times a day.   insulin lispro 100 UNIT/ML injection Commonly known as:  HUMALOG Inject 2 Units into the skin 3 (three) times daily before meals. Pt uses per  sliding scale.   isosorbide mononitrate 30 MG 24 hr tablet Commonly known as:  IMDUR Take 1 tablet (30 mg total) by mouth daily.   metoprolol tartrate 25 MG tablet Commonly known as:  LOPRESSOR Take 1 tablet (25 mg total) by mouth 2 (two) times daily.   nitroGLYCERIN 0.4 MG SL tablet Commonly known as:  NITROSTAT Place 1 tablet (0.4 mg total) under the tongue every 5 (five) minutes as needed for chest pain.   omeprazole 40 MG capsule Commonly known as:  PRILOSEC Take 1 capsule (40 mg total) by mouth daily.   ONE TOUCH ULTRA 2 w/Device Kit 1 Device by Does not apply route once. Diagnosis: Diabetes Type 2 DX Code: W96.75   ONETOUCH DELICA LANCETS 91M Misc 1 strip by Does not apply route 3 (three) times daily with meals. Diagnosis: Diabetes Type 2 DX Code: E11.40   polyethylene glycol packet Commonly known as:  MIRALAX / GLYCOLAX Take 17 g by mouth daily. Every other day   pravastatin 40 MG tablet Commonly known as:  PRAVACHOL Take 1 tablet (40 mg total) by mouth daily.   predniSONE 10 MG tablet Commonly known as:  DELTASONE Take 1 tablet (10 mg total) by mouth daily.   tamsulosin 0.4 MG Caps capsule Commonly known as:  FLOMAX Take 1 capsule (0.4 mg total) by mouth daily.   timolol 0.5 % ophthalmic solution Commonly known as:  TIMOPTIC Place 1 drop into both eyes daily.   traMADol 50 MG tablet Commonly known as:  ULTRAM TAKE  (1)  TABLET  THREE TIMES DAILY AS NEEDED FOR PAIN.   verapamil 40 MG tablet Commonly known as:  CALAN TAKE 1 TABLET TWICE A DAY        DISCHARGE INSTRUCTIONS:  See AVS. If you experience worsening of your admission symptoms, develop shortness of breath, life threatening emergency, suicidal or homicidal thoughts you must seek medical attention immediately by calling 911 or calling your MD immediately  if symptoms less severe.  You Must read complete instructions/literature along with all the possible adverse reactions/side effects for all  the Medicines you take and that have been prescribed to you. Take any new Medicines after you have completely understood and accpet all the possible adverse reactions/side effects.   Please note  You were cared for by a hospitalist during your hospital stay. If you have any questions about your discharge medications or the care you received while you were in the hospital after you are discharged, you can call the unit and asked to speak with the hospitalist on call if the hospitalist that took care of you is not available. Once you are discharged, your primary care physician will handle any further medical issues. Please note that NO REFILLS for any discharge medications will be authorized once you are discharged, as it is imperative that you return to your primary care physician (or establish a relationship with a primary care physician if you do not have one) for your aftercare needs so that they can reassess your need for medications and monitor your lab values.    On the day  of Discharge:  VITAL SIGNS:  Blood pressure (!) 153/58, pulse 60, temperature 98.7 F (37.1 C), temperature source Oral, resp. rate 18, height '6\' 3"'$  (1.905 m), weight 165 lb (74.8 kg), SpO2 96 %. PHYSICAL EXAMINATION:  GENERAL:  83 y.o.-year-old patient lying in the bed with no acute distress.  EYES: Pupils equal, round, reactive to light and accommodation. No scleral icterus. Extraocular muscles intact.  HEENT: Head atraumatic, normocephalic. Oropharynx and nasopharynx clear.  NECK:  Supple, no jugular venous distention. No thyroid enlargement, no tenderness.  LUNGS: Normal breath sounds bilaterally, no wheezing, rales,rhonchi or crepitation. No use of accessory muscles of respiration.  CARDIOVASCULAR: S1, S2 normal. No murmurs, rubs, or gallops.  ABDOMEN: Soft, non-tender, non-distended. Bowel sounds present. No organomegaly or mass.  EXTREMITIES: No pedal edema, cyanosis, or clubbing.  NEUROLOGIC: Cranial nerves II  through XII are intact. Muscle strength 5/5 in all extremities. Sensation intact. Gait not checked.  PSYCHIATRIC: The patient is alert and oriented x 3.  SKIN: No obvious rash, lesion, or ulcer.  DATA REVIEW:   CBC  Recent Labs Lab 03/14/17 0430  WBC 7.0  HGB 9.3*  HCT 28.6*  PLT 131*    Chemistries   Recent Labs Lab 03/14/17 0430  NA 141  K 4.1  CL 111  CO2 25  GLUCOSE 118*  BUN 40*  CREATININE 2.14*  CALCIUM 8.1*     Microbiology Results  Results for orders placed or performed during the hospital encounter of 09/02/15  Urine culture     Status: None   Collection Time: 09/02/15  5:11 PM  Result Value Ref Range Status   Specimen Description URINE, CLEAN CATCH  Final   Special Requests NONE  Final   Culture INSIGNIFICANT GROWTH  Final   Report Status 09/04/2015 FINAL  Final    RADIOLOGY:  No results found.   Management plans discussed with the patient, family and they are in agreement.  CODE STATUS: Partial Code   TOTAL TIME TAKING CARE OF THIS PATIENT: 26 minutes.    Demetrios Loll M.D on 03/15/2017 at 7:54 AM  Between 7am to 6pm - Pager - [phone]  After 6pm go to [email or web address] - Proofreader  [facility] [place] [facility]  Office  [phone]  CC: Primary care physician; Venia Carbon, MD   Note: This dictation was prepared with Dragon dictation along with smaller phrase technology. Any transcriptional errors that result from this process are unintentional.

## 2017-03-15 NOTE — Discharge Instructions (Signed)
Heart healthy and ADA diet. °HHPT °Fall precaution. °

## 2017-03-15 NOTE — Progress Notes (Signed)
[facility]  SUBJECTIVE: Patient slept well last night. He has mild exertional shortness of breath. Patient states he was active yesterday with physical therapy, and denies experiencing chest pain.    Vitals:   03/14/17 0724 03/14/17 1135 03/14/17 1928 03/15/17 0623  BP: (!) 119/49 (!) 139/59 (!) 143/71 (!) 153/58  Pulse: 64 62 (!) 59 60  Resp: 17 20 18 18   Temp: 98.1 F (36.7 C) 98.8 F (37.1 C) 98.1 F (36.7 C) 98.7 F (37.1 C)  TempSrc: Oral  Oral Oral  SpO2: 96% 95% 95% 96%  Weight:      Height:         Intake/Output Summary (Last 24 hours) at 03/15/17 1610 Last data filed at 03/15/17 9604  Gross per 24 hour  Intake              600 ml  Output             1025 ml  Net             -425 ml      PHYSICAL EXAM  General: Well developed, well nourished, in no acute distress HEENT:  Normocephalic and atramatic Neck:  No JVD.  Lungs: Clear bilaterally to auscultation and percussion. Heart: HRRR . Normal S1 and S2 without gallops or murmurs.  Abdomen: Bowel sounds are positive, abdomen soft and non-tender  Msk:  Back normal, normal gait. Normal strength and tone for age. Extremities: Trace pedal edema bilaterally   Neuro: Alert and oriented X 3. Psych:  Good affect, responds appropriately   LABS: Basic Metabolic Panel:  Recent Labs  54/09/81 0833 03/14/17 0430  NA 141 141  K 4.1 4.1  CL 108 111  CO2 26 25  GLUCOSE 143* 118*  BUN 40* 40*  CREATININE 2.23* 2.14*  CALCIUM 8.4* 8.1*   Liver Function Tests: No results for input(s): AST, ALT, ALKPHOS, BILITOT, PROT, ALBUMIN in the last 72 hours. No results for input(s): LIPASE, AMYLASE in the last 72 hours. CBC:  Recent Labs  03/13/17 0833 03/14/17 0430  WBC 7.6 7.0  HGB 10.2* 9.3*  HCT 30.9* 28.6*  MCV 88.7 88.5  PLT 137* 131*   Cardiac Enzymes:  Recent Labs  03/13/17 1208 03/13/17 1508 03/13/17 1724  TROPONINI 0.04* 0.03* 0.03*   BNP: Invalid input(s): POCBNP D-Dimer: No results for  input(s): DDIMER in the last 72 hours. Hemoglobin A1C: No results for input(s): HGBA1C in the last 72 hours. Fasting Lipid Panel:  Recent Labs  03/13/17 1508  CHOL 91  HDL 31*  LDLCALC 46  TRIG 69  CHOLHDL 2.9   Thyroid Function Tests: No results for input(s): TSH, T4TOTAL, T3FREE, THYROIDAB in the last 72 hours.  Invalid input(s): FREET3 Anemia Panel: No results for input(s): VITAMINB12, FOLATE, FERRITIN, TIBC, IRON, RETICCTPCT in the last 72 hours.  Dg Chest 1 View  Result Date: 03/13/2017 CLINICAL DATA:  Chest pain and weakness. EXAM: CHEST 1 VIEW COMPARISON:  02/17/2017 FINDINGS: The cardiac silhouette is upper limits of normal in size. Tortuosity and atherosclerotic calcification are again noted of the thoracic aorta. The lungs are slightly hyperinflated with similar appearance of mild basilar predominant reticular opacities which may reflect underlying interstitial lung disease. No confluent airspace opacity, overt pulmonary edema, sizable pleural effusion, or pneumothorax is identified. Soft tissue calcification in the right axilla is unchanged. No acute osseous abnormality is seen. IMPRESSION: Chronic lung changes without evidence of acute abnormality. Electronically Signed   By: Jolaine Click.D.  On: 03/13/2017 10:00     Echo: EF 50-55%  TELEMETRY: Normal sinus rhythm, rate 74 bpm  ASSESSMENT AND PLAN:  Principal Problem:   Chest pain    1. Chest pain, with known CAD, with troponin 0.03, currently without chest pain at rest or with exertion. 2. Hypertension  Recommendations: 1. Agree with current therapy 2. Continue metoprolol and Imdur 3. Defer Plavix in light of patient's baseline anemia and advanced age 624. Clear to discharge from a cardiovascular standpoint. Plan to follow-up with Dr. Juliann Paresallwood as outpatient in 1 week.   Leanora Ivanoffnna Arber Wiemers, PA-C 03/15/2017 9:24 AM

## 2017-03-15 NOTE — Care Management Note (Signed)
Case Management Note  Patient Details  Name: Prudencio BurlySeawell C Ferrall MRN: [id number] Date of Birth: 02/15/1918  Subjective/Objective:                 For discharge home today with home health nurse and physical therapy. [facility] is not able to accept home health nurse referrals at present with patient's medicare uhc so will have to call another agency that is accepting current plan   Action/Plan: Referral called to [facility]  Expected Discharge Date:  03/15/17               Expected Discharge Plan:     In-House Referral:     Discharge planning Services     Post Acute Care Choice:    Choice offered to:     DME Arranged:    DME Agency:     HH Arranged:    HH Agency:     Status of Service:     If discussed at [employer] Meetings, dates discussed:    Additional Comments:  Eber HongGreene, Quintessa Simmerman R, RN 03/15/2017, 8:20 AM

## 2017-03-16 ENCOUNTER — Telehealth: Payer: Self-pay | Admitting: *Deleted

## 2017-03-16 DIAGNOSIS — Z9181 History of falling: Secondary | ICD-10-CM | POA: Diagnosis not present

## 2017-03-16 DIAGNOSIS — M6281 Muscle weakness (generalized): Secondary | ICD-10-CM | POA: Diagnosis not present

## 2017-03-16 DIAGNOSIS — E1122 Type 2 diabetes mellitus with diabetic chronic kidney disease: Secondary | ICD-10-CM | POA: Diagnosis not present

## 2017-03-16 DIAGNOSIS — N183 Chronic kidney disease, stage 3 (moderate): Secondary | ICD-10-CM | POA: Diagnosis not present

## 2017-03-16 DIAGNOSIS — I120 Hypertensive chronic kidney disease with stage 5 chronic kidney disease or end stage renal disease: Secondary | ICD-10-CM | POA: Diagnosis not present

## 2017-03-16 DIAGNOSIS — M1990 Unspecified osteoarthritis, unspecified site: Secondary | ICD-10-CM | POA: Diagnosis not present

## 2017-03-16 DIAGNOSIS — M353 Polymyalgia rheumatica: Secondary | ICD-10-CM | POA: Diagnosis not present

## 2017-03-16 DIAGNOSIS — Z794 Long term (current) use of insulin: Secondary | ICD-10-CM | POA: Diagnosis not present

## 2017-03-16 DIAGNOSIS — I2510 Atherosclerotic heart disease of native coronary artery without angina pectoris: Secondary | ICD-10-CM | POA: Diagnosis not present

## 2017-03-16 DIAGNOSIS — Z7984 Long term (current) use of oral hypoglycemic drugs: Secondary | ICD-10-CM | POA: Diagnosis not present

## 2017-03-16 DIAGNOSIS — R2689 Other abnormalities of gait and mobility: Secondary | ICD-10-CM | POA: Diagnosis not present

## 2017-03-16 DIAGNOSIS — N400 Enlarged prostate without lower urinary tract symptoms: Secondary | ICD-10-CM | POA: Diagnosis not present

## 2017-03-16 NOTE — Telephone Encounter (Signed)
Attempted to complete TCM and confirm hosp f/u appt. Unable to leave vm

## 2017-03-17 NOTE — Telephone Encounter (Addendum)
Attempted to contact pt to complete TCM and confirm hosp f/u appt. Unable to leave vm

## 2017-03-22 ENCOUNTER — Ambulatory Visit (INDEPENDENT_AMBULATORY_CARE_PROVIDER_SITE_OTHER): Payer: Medicare Other | Admitting: Internal Medicine

## 2017-03-22 ENCOUNTER — Encounter: Payer: Self-pay | Admitting: Internal Medicine

## 2017-03-22 VITALS — BP 154/76 | HR 56 | Temp 97.5°F | Wt 163.0 lb

## 2017-03-22 DIAGNOSIS — I700 Atherosclerosis of aorta: Secondary | ICD-10-CM

## 2017-03-22 DIAGNOSIS — N183 Chronic kidney disease, stage 3 (moderate): Secondary | ICD-10-CM

## 2017-03-22 DIAGNOSIS — E1122 Type 2 diabetes mellitus with diabetic chronic kidney disease: Secondary | ICD-10-CM

## 2017-03-22 DIAGNOSIS — Z794 Long term (current) use of insulin: Secondary | ICD-10-CM

## 2017-03-22 DIAGNOSIS — I25119 Atherosclerotic heart disease of native coronary artery with unspecified angina pectoris: Secondary | ICD-10-CM

## 2017-03-22 NOTE — Assessment & Plan Note (Signed)
Brief hospitalization for apparent angina No MI Echo looked okay No chest pain now---back on isosorbide Has nitro for prn Discussed red flag symptoms to warrant 911 again

## 2017-03-22 NOTE — Assessment & Plan Note (Signed)
Seen again on 4/18 CT scan No action needed

## 2017-03-22 NOTE — Assessment & Plan Note (Signed)
Has had reasonable control Will recheck at his regular visit

## 2017-03-22 NOTE — Progress Notes (Signed)
Subjective:    Patient ID: Evan [name], male    DOB: 20-Apr-1918, 83 y.o.   MRN: [id number]  HPI Here for hospital follow up  Reviewed hospital records Had chest pain--didn't have any nitro so couldn't try it Some SOB but not too bad troponins negative Echo EF 50-55, mild valvular regurgitation only Isosorbide restarted  Feels okay No chest pain since discharge Breathing is okay Some sense of palpitations at times--not fast, just skipping Some edema at times  No cough or wheezing No leg pain with his limited walking Sugars have been okay  Current Outpatient Prescriptions on File Prior to Visit  Medication Sig Dispense Refill  . aspirin EC 81 MG tablet Take 81 mg by mouth daily.    . bimatoprost (LUMIGAN) 0.01 % SOLN Place 1 drop into both eyes at bedtime.    . Blood Glucose Monitoring Suppl (ONE TOUCH ULTRA 2) w/Device KIT 1 Device by Does not apply route once. Diagnosis: Diabetes Type 2 DX Code: E11.40 1 each 0  . fluticasone (FLONASE) 50 MCG/ACT nasal spray Place 1 spray into both nostrils daily.    Marland Kitchen gabapentin (NEURONTIN) 100 MG capsule Take 1 capsule in the morning and 2 capsules at bedtime. 270 capsule 3  . glipiZIDE (GLUCOTROL) 5 MG tablet Take 1 tablet (5 mg total) by mouth daily. 90 tablet 3  . GLUCERNA (GLUCERNA) LIQD Take 237 mLs by mouth 2 (two) times daily between meals.    Marland Kitchen glucose blood (ONE TOUCH TEST STRIPS) test strip Test blood sugar 3 times a day. 100 each 5  . insulin lispro (HUMALOG) 100 UNIT/ML injection Inject 2 Units into the skin 3 (three) times daily before meals. Pt uses per sliding scale.    . isosorbide mononitrate (IMDUR) 30 MG 24 hr tablet Take 1 tablet (30 mg total) by mouth daily. 30 tablet 2  . metoprolol tartrate (LOPRESSOR) 25 MG tablet Take 1 tablet (25 mg total) by mouth 2 (two) times daily. 60 tablet 2  . nitroGLYCERIN (NITROSTAT) 0.4 MG SL tablet Place 1 tablet (0.4 mg total) under the tongue every 5 (five) minutes as needed for  chest pain. 25 tablet 2  . omeprazole (PRILOSEC) 40 MG capsule Take 1 capsule (40 mg total) by mouth daily. 90 capsule 3  . ONETOUCH DELICA LANCETS 94T MISC 1 strip by Does not apply route 3 (three) times daily with meals. Diagnosis: Diabetes Type 2 DX Code: E11.40 100 each 5  . polyethylene glycol (MIRALAX / GLYCOLAX) packet Take 17 g by mouth daily. Every other day    . pravastatin (PRAVACHOL) 40 MG tablet Take 1 tablet (40 mg total) by mouth daily. 90 tablet 3  . predniSONE (DELTASONE) 10 MG tablet Take 1 tablet (10 mg total) by mouth daily. 30 tablet 11  . tamsulosin (FLOMAX) 0.4 MG CAPS capsule Take 1 capsule (0.4 mg total) by mouth daily. 90 capsule 3  . timolol (TIMOPTIC) 0.5 % ophthalmic solution Place 1 drop into both eyes daily.    . traMADol (ULTRAM) 50 MG tablet TAKE  (1)  TABLET  THREE TIMES DAILY AS NEEDED FOR PAIN. 30 tablet 0  . verapamil (CALAN) 40 MG tablet TAKE 1 TABLET TWICE A DAY 180 tablet 1   No current facility-administered medications on file prior to visit.     Allergies  Allergen Reactions  . Penicillins Rash and Other (See Comments)    Has patient had a PCN reaction causing immediate rash, facial/tongue/throat swelling, SOB or lightheadedness with  hypotension: No Has patient had a PCN reaction causing severe rash involving mucus membranes or skin necrosis: No Has patient had a PCN reaction that required hospitalization No Has patient had a PCN reaction occurring within the last 10 years: No If all of the above answers are "NO", then may proceed with Cephalosporin use.     Past Medical History:  Diagnosis Date  . Arthritis   . BPH (benign prostatic hypertrophy)   . CAD (coronary artery disease)   . Chronic kidney disease, stage III (moderate)   . CVA (cerebral vascular accident) ([place])   . Degenerative disc disease   . Diabetes mellitus   . Gastroenteritis   . GERD (gastroesophageal reflux disease)   . Hyperlipidemia   . Hypertension   . Polymyalgia  rheumatica (HCC)     Past Surgical History:  Procedure Laterality Date  . ANGIOPLASTY    . CATARACT EXTRACTION  1999 1992   glaucoma  . ESOPHAGEAL DILATION    . ESOPHAGOGASTRODUODENOSCOPY N/A 04/09/2015   Procedure: ESOPHAGOGASTRODUODENOSCOPY (EGD);  Surgeon: Josefine Class, MD;  Location: [facility] ENDOSCOPY;  Service: Endoscopy;  Laterality: N/A;  . SAVORY DILATION N/A 04/09/2015   Procedure: SAVORY DILATION;  Surgeon: Josefine Class, MD;  Location: [facility] ENDOSCOPY;  Service: Endoscopy;  Laterality: N/A;  . US ECHOCARDIOGRAPHY  04/2004   benign    Family History  Problem Relation Age of Onset  . Constipation Mother   . Diabetes Maternal Uncle     Social History   Social History  . Marital status: Widowed    Spouse name: N/A  . Number of children: N/A  . Years of education: N/A   Occupational History  . retired    Social History Main Topics  . Smoking status: Former Research scientist (life sciences)  . Smokeless tobacco: Never Used     Comment: quit over 20 years ago  . Alcohol use No  . Drug use: No  . Sexual activity: Not on file   Other Topics Concern  . Not on file   Social History Narrative   Religion affecting care--involved with Church throughout life (deacon, etc)      Has living will    Requests son [name]  as his health care power of attorney   Requests DNR--- done 10/15/14   No tube feeds if cognitively unaware            Review of Systems Appetite still good Weight fairly stable--down 1-2# Sleeps well. Sleeps in bed--head down. No PND No headache    Objective:   Physical Exam  Constitutional: No distress.  Neck: No JVD present.  Cardiovascular: Normal rate, regular rhythm and normal heart sounds.  Exam reveals no gallop.   No murmur heard. Pulmonary/Chest: Effort normal and breath sounds normal. No respiratory distress. He has no wheezes. He has no rales.  Musculoskeletal:  1+ edema in right calf  Psychiatric: He has a normal mood and affect. His behavior is  normal.          Assessment & Plan:

## 2017-03-29 ENCOUNTER — Telehealth: Payer: Self-pay

## 2017-03-29 NOTE — Telephone Encounter (Signed)
V/M left from [facility] requesting status of faxed refill request for 17 medications on 03/27/17. Request cb.

## 2017-03-29 NOTE — Telephone Encounter (Signed)
[facility] Advised her I did not receive the refill requests. She said they are a medication pillpack  Company. They said they received a referral from his caregiver. They are faxing me the requests, again. I have called and left a message for the pt to verifiy he wants to use them for his medications.

## 2017-03-31 NOTE — Telephone Encounter (Signed)
[facility] left v/m requesting status of refill request sent to office.

## 2017-04-04 NOTE — Telephone Encounter (Signed)
Finally spoke to Mr Evan [name]. He does want to use [facility] for their pillpack system. I will do the refills by the paperwork since he has tramadol on his med list. Verified all of his meds with his caretaker.

## 2017-04-04 NOTE — Telephone Encounter (Signed)
rxs faxed to [facility]

## 2017-04-06 ENCOUNTER — Other Ambulatory Visit: Payer: Self-pay | Admitting: Internal Medicine

## 2017-04-12 ENCOUNTER — Telehealth: Payer: Self-pay | Admitting: *Deleted

## 2017-04-12 NOTE — Telephone Encounter (Signed)
Loraine LericheMark, PT assistant with [facility] contacted office and reported pt fell last night after "everyone had left." Pt has no visible injury but is c/o R knee pain. Loraine LericheMark wanted to send as FYI to Dr Alphonsus SiasLetvak, incase he is wanting pt to follow up. Loraine LericheMark can be reached at tele# provided if needed

## 2017-04-13 NOTE — Telephone Encounter (Signed)
Okay Please call him to make sure he is okay

## 2017-04-13 NOTE — Telephone Encounter (Signed)
Spoke to pt. He said he did not get hurt and said he was fine.

## 2017-04-14 ENCOUNTER — Telehealth: Payer: Self-pay | Admitting: Internal Medicine

## 2017-04-14 DIAGNOSIS — Z7689 Persons encountering health services in other specified circumstances: Secondary | ICD-10-CM

## 2017-04-14 NOTE — Telephone Encounter (Signed)
Signed Thanks for filling them out

## 2017-04-14 NOTE — Telephone Encounter (Signed)
Son faxed fmla paperwork  In Dr Karle StarchLetvak's IN BOX for Review and signature

## 2017-04-17 ENCOUNTER — Telehealth: Payer: Self-pay

## 2017-04-17 NOTE — Telephone Encounter (Signed)
PT with [facility] left v/m [facility].

## 2017-04-17 NOTE — Telephone Encounter (Signed)
That is fine 

## 2017-04-17 NOTE — Telephone Encounter (Signed)
Spoke to [employer] Sison with verbal order

## 2017-04-18 ENCOUNTER — Ambulatory Visit (INDEPENDENT_AMBULATORY_CARE_PROVIDER_SITE_OTHER): Payer: Medicare Other | Admitting: Internal Medicine

## 2017-04-18 ENCOUNTER — Encounter: Payer: Self-pay | Admitting: Internal Medicine

## 2017-04-18 VITALS — BP 114/60 | HR 56 | Temp 97.4°F

## 2017-04-18 DIAGNOSIS — R609 Edema, unspecified: Secondary | ICD-10-CM | POA: Diagnosis not present

## 2017-04-18 MED ORDER — FUROSEMIDE 20 MG PO TABS: 20.0000 mg | ORAL_TABLET | Freq: Every day | ORAL | 1 refills | Status: DC | PRN

## 2017-04-18 NOTE — Telephone Encounter (Signed)
Paperwork faxed 6/11 Copy for file Copy for scan Copy for billing

## 2017-04-18 NOTE — Assessment & Plan Note (Signed)
Unusual pattern of being worse in AM Lies flat at night Stable angina but no clear CHF Will try prn dose of furosemide for occasional use Follow up 1 month

## 2017-04-18 NOTE — Patient Instructions (Signed)
You can try the furosemide (fluid pill) as needed in the morning if the leg swelling is bad.  Try not to take it more than 2-3 times per week.

## 2017-04-18 NOTE — Progress Notes (Signed)
Subjective:    Patient ID: Evan [name], male    DOB: 08/19/1918, 83 y.o.   MRN: [id number]  HPI Here due to leg swelling Goes back a while but "really rough for the past 2 weeks" Hard to even lift legs up when first getting out of bed---even with them propped up Some pain at times--but not always  Support socks help during the day Breathing has been okay Sleeps flat in bed---did have PND yesterday (settled down after sitting briefly) Intermittent chest pain--- no nitro recently  Current Outpatient Prescriptions on File Prior to Visit  Medication Sig Dispense Refill  . aspirin EC 81 MG tablet Take 81 mg by mouth daily.    . bimatoprost (LUMIGAN) 0.01 % SOLN Place 1 drop into both eyes at bedtime.    . Blood Glucose Monitoring Suppl (ONE TOUCH ULTRA 2) w/Device KIT 1 Device by Does not apply route once. Diagnosis: Diabetes Type 2 DX Code: E11.40 1 each 0  . fluticasone (FLONASE) 50 MCG/ACT nasal spray Place 2 sprays into the nose daily.    Marland Kitchen gabapentin (NEURONTIN) 100 MG capsule Take 1 capsule in the morning and 2 capsules at bedtime. 270 capsule 3  . glipiZIDE (GLUCOTROL) 5 MG tablet Take 1 tablet (5 mg total) by mouth daily. 90 tablet 3  . GLUCERNA (GLUCERNA) LIQD Take 237 mLs by mouth 2 (two) times daily between meals.    Marland Kitchen glucose blood (ONE TOUCH TEST STRIPS) test strip Test blood sugar 3 times a day. 100 each 5  . insulin lispro (HUMALOG) 100 UNIT/ML injection Inject 2 Units into the skin 3 (three) times daily before meals. Pt uses per sliding scale.    . isosorbide mononitrate (IMDUR) 30 MG 24 hr tablet Take 1 tablet (30 mg total) by mouth daily. 30 tablet 2  . metoprolol tartrate (LOPRESSOR) 25 MG tablet Take 1 tablet (25 mg total) by mouth 2 (two) times daily. 60 tablet 2  . nitroGLYCERIN (NITROSTAT) 0.4 MG SL tablet Place 1 tablet (0.4 mg total) under the tongue every 5 (five) minutes as needed for chest pain. 25 tablet 2  . omeprazole (PRILOSEC) 40 MG capsule Take 1  capsule (40 mg total) by mouth daily. 90 capsule 3  . ONETOUCH DELICA LANCETS 50D MISC 1 strip by Does not apply route 3 (three) times daily with meals. Diagnosis: Diabetes Type 2 DX Code: E11.40 100 each 5  . polyethylene glycol (MIRALAX / GLYCOLAX) packet Take 17 g by mouth daily. Every other day    . pravastatin (PRAVACHOL) 40 MG tablet Take 1 tablet (40 mg total) by mouth daily. 90 tablet 3  . predniSONE (DELTASONE) 10 MG tablet Take 1 tablet (10 mg total) by mouth daily. 30 tablet 11  . tamsulosin (FLOMAX) 0.4 MG CAPS capsule Take 1 capsule (0.4 mg total) by mouth daily. 90 capsule 3  . timolol (TIMOPTIC) 0.5 % ophthalmic solution Place 1 drop into both eyes daily.    . traMADol (ULTRAM) 50 MG tablet TAKE  (1)  TABLET  THREE TIMES DAILY AS NEEDED FOR PAIN. 30 tablet 0  . verapamil (CALAN) 40 MG tablet TAKE 1 TABLET TWICE A DAY 180 tablet 1   No current facility-administered medications on file prior to visit.     Allergies  Allergen Reactions  . Penicillins Rash and Other (See Comments)    Has patient had a PCN reaction causing immediate rash, facial/tongue/throat swelling, SOB or lightheadedness with hypotension: No Has patient had a PCN reaction  causing severe rash involving mucus membranes or skin necrosis: No Has patient had a PCN reaction that required hospitalization No Has patient had a PCN reaction occurring within the last 10 years: No If all of the above answers are "NO", then may proceed with Cephalosporin use.     Past Medical History:  Diagnosis Date  . Arthritis   . BPH (benign prostatic hypertrophy)   . CAD (coronary artery disease)   . Chronic kidney disease, stage III (moderate)   . CVA (cerebral vascular accident) ([place])   . Degenerative disc disease   . Diabetes mellitus   . Gastroenteritis   . GERD (gastroesophageal reflux disease)   . Hyperlipidemia   . Hypertension   . Polymyalgia rheumatica (HCC)     Past Surgical History:  Procedure Laterality Date   . ANGIOPLASTY    . CATARACT EXTRACTION  1999 1992   glaucoma  . ESOPHAGEAL DILATION    . ESOPHAGOGASTRODUODENOSCOPY N/A 04/09/2015   Procedure: ESOPHAGOGASTRODUODENOSCOPY (EGD);  Surgeon: Josefine Class, MD;  Location: [facility] ENDOSCOPY;  Service: Endoscopy;  Laterality: N/A;  . SAVORY DILATION N/A 04/09/2015   Procedure: SAVORY DILATION;  Surgeon: Josefine Class, MD;  Location: [facility] ENDOSCOPY;  Service: Endoscopy;  Laterality: N/A;  . US ECHOCARDIOGRAPHY  04/2004   benign    Family History  Problem Relation Age of Onset  . Constipation Mother   . Diabetes Maternal Uncle     Social History   Social History  . Marital status: Widowed    Spouse name: N/A  . Number of children: N/A  . Years of education: N/A   Occupational History  . retired    Social History Main Topics  . Smoking status: Former Research scientist (life sciences)  . Smokeless tobacco: Never Used     Comment: quit over 20 years ago  . Alcohol use No  . Drug use: No  . Sexual activity: Not on file   Other Topics Concern  . Not on file   Social History Narrative   Religion affecting care--involved with Church throughout life (deacon, etc)      Has living will    Requests son [name]  as his health care power of attorney   Requests DNR--- done 10/15/14   No tube feeds if cognitively unaware            Review of Systems Appetite is good Weight seems stable (declined weight today)    Objective:   Physical Exam  Constitutional: He appears well-developed. No distress.  Cardiovascular: Exam reveals no gallop.   Slow but regular  Pulmonary/Chest:  Decreased breath sounds but clear  Musculoskeletal:  1+ pitting edema with some clumping in mid calf          Assessment & Plan:

## 2017-04-20 ENCOUNTER — Other Ambulatory Visit: Payer: Self-pay

## 2017-04-20 MED ORDER — TRAMADOL HCL 50 MG PO TABS: ORAL_TABLET | ORAL | 0 refills | Status: DC

## 2017-04-20 NOTE — Telephone Encounter (Signed)
Rx faxed to [facility]. Confirmation received from the fax.

## 2017-04-20 NOTE — Telephone Encounter (Signed)
Approved: 30 x 0 

## 2017-04-20 NOTE — Telephone Encounter (Signed)
[facility] left v/m requesting refill tramadol faxed to [phone]. Last refilled # 30 on 11/10/16. Last seen 04/18/17.

## 2017-04-25 ENCOUNTER — Telehealth: Payer: Self-pay

## 2017-04-25 NOTE — Telephone Encounter (Signed)
Cordelia PenSherry, [facility] nurse, calls to report that they will be extending patient's home health visits due to acute, unstable elevations in blood pressures.  BP's have been running 196/88, 186/92, and 156/88 (rest) over past several days.  Patient has appointment to see Dr. Alphonsus SiasLetvak to review this with him tomorrow (04/26/17).  Please let Cordelia PenSherry know if anything further is needed or if there are any further questions.

## 2017-04-26 ENCOUNTER — Encounter: Payer: Self-pay | Admitting: Internal Medicine

## 2017-04-26 ENCOUNTER — Ambulatory Visit (INDEPENDENT_AMBULATORY_CARE_PROVIDER_SITE_OTHER): Payer: Medicare Other | Admitting: Internal Medicine

## 2017-04-26 VITALS — BP 172/80 | HR 58 | Temp 97.4°F | Wt 163.0 lb

## 2017-04-26 DIAGNOSIS — I10 Essential (primary) hypertension: Secondary | ICD-10-CM | POA: Diagnosis not present

## 2017-04-26 MED ORDER — VERAPAMIL HCL ER 120 MG PO CP24: 120.0000 mg | ORAL_CAPSULE | Freq: Every day | ORAL | 11 refills | Status: DC

## 2017-04-26 NOTE — Telephone Encounter (Signed)
Okay He is at great risk of orthostatic hypotension so care is needed with Rx and may have to tolerate elevated BP at times

## 2017-04-26 NOTE — Progress Notes (Signed)
Subjective:    Patient ID: Evan [name], male    DOB: 11-18-1917, 83 y.o.   MRN: [id number]  HPI Here with friend Fatima Sanger who drove him  Seen by home health nurse Checked his BP and it has been running high No headache No recent chest pain--just some ache in right chest at times No palpitations Breathing okay  Swelling is somewhat better Uses the support socks but some persistence--especially in right calf  Using the furosemide every other day  Current Outpatient Prescriptions on File Prior to Visit  Medication Sig Dispense Refill  . aspirin EC 81 MG tablet Take 81 mg by mouth daily.    . bimatoprost (LUMIGAN) 0.01 % SOLN Place 1 drop into both eyes at bedtime.    . Blood Glucose Monitoring Suppl (ONE TOUCH ULTRA 2) w/Device KIT 1 Device by Does not apply route once. Diagnosis: Diabetes Type 2 DX Code: E11.40 1 each 0  . fluticasone (FLONASE) 50 MCG/ACT nasal spray Place 2 sprays into the nose daily.    . furosemide (LASIX) 20 MG tablet Take 1 tablet (20 mg total) by mouth daily as needed. For morning leg swelling 30 tablet 1  . gabapentin (NEURONTIN) 100 MG capsule Take 1 capsule in the morning and 2 capsules at bedtime. 270 capsule 3  . glipiZIDE (GLUCOTROL) 5 MG tablet Take 1 tablet (5 mg total) by mouth daily. 90 tablet 3  . GLUCERNA (GLUCERNA) LIQD Take 237 mLs by mouth 2 (two) times daily between meals.    Marland Kitchen glucose blood (ONE TOUCH TEST STRIPS) test strip Test blood sugar 3 times a day. 100 each 5  . insulin lispro (HUMALOG) 100 UNIT/ML injection Inject 2 Units into the skin 3 (three) times daily before meals. Pt uses per sliding scale.    . isosorbide mononitrate (IMDUR) 30 MG 24 hr tablet Take 1 tablet (30 mg total) by mouth daily. 30 tablet 2  . metoprolol tartrate (LOPRESSOR) 25 MG tablet Take 1 tablet (25 mg total) by mouth 2 (two) times daily. 60 tablet 2  . nitroGLYCERIN (NITROSTAT) 0.4 MG SL tablet Place 1 tablet (0.4 mg total) under the tongue every 5 (five) minutes  as needed for chest pain. 25 tablet 2  . omeprazole (PRILOSEC) 40 MG capsule Take 1 capsule (40 mg total) by mouth daily. 90 capsule 3  . ONETOUCH DELICA LANCETS 81E MISC 1 strip by Does not apply route 3 (three) times daily with meals. Diagnosis: Diabetes Type 2 DX Code: E11.40 100 each 5  . polyethylene glycol (MIRALAX / GLYCOLAX) packet Take 17 g by mouth daily. Every other day    . pravastatin (PRAVACHOL) 40 MG tablet Take 1 tablet (40 mg total) by mouth daily. 90 tablet 3  . predniSONE (DELTASONE) 10 MG tablet Take 1 tablet (10 mg total) by mouth daily. 30 tablet 11  . tamsulosin (FLOMAX) 0.4 MG CAPS capsule Take 1 capsule (0.4 mg total) by mouth daily. 90 capsule 3  . timolol (TIMOPTIC) 0.5 % ophthalmic solution Place 1 drop into both eyes daily.    . traMADol (ULTRAM) 50 MG tablet TAKE  (1)  TABLET  THREE TIMES DAILY AS NEEDED FOR PAIN. 30 tablet 0  . verapamil (CALAN) 40 MG tablet TAKE 1 TABLET TWICE A DAY 180 tablet 1   No current facility-administered medications on file prior to visit.     Allergies  Allergen Reactions  . Penicillins Rash and Other (See Comments)    Has patient had a PCN  reaction causing immediate rash, facial/tongue/throat swelling, SOB or lightheadedness with hypotension: No Has patient had a PCN reaction causing severe rash involving mucus membranes or skin necrosis: No Has patient had a PCN reaction that required hospitalization No Has patient had a PCN reaction occurring within the last 10 years: No If all of the above answers are "NO", then may proceed with Cephalosporin use.     Past Medical History:  Diagnosis Date  . Arthritis   . BPH (benign prostatic hypertrophy)   . CAD (coronary artery disease)   . Chronic kidney disease, stage III (moderate)   . CVA (cerebral vascular accident) ([place])   . Degenerative disc disease   . Diabetes mellitus   . Gastroenteritis   . GERD (gastroesophageal reflux disease)   . Hyperlipidemia   . Hypertension   .  Polymyalgia rheumatica (HCC)     Past Surgical History:  Procedure Laterality Date  . ANGIOPLASTY    . CATARACT EXTRACTION  1999 1992   glaucoma  . ESOPHAGEAL DILATION    . ESOPHAGOGASTRODUODENOSCOPY N/A 04/09/2015   Procedure: ESOPHAGOGASTRODUODENOSCOPY (EGD);  Surgeon: Josefine Class, MD;  Location[facility] ENDOSCOPY;  Service: Endoscopy;  Laterality: N/A;  . SAVORY DILATION N/A 04/09/2015   Procedure: SAVORY DILATION;  Surgeon: Josefine Class, MD;  Location: [facility] ENDOSCOPY;  Service: Endoscopy;  Laterality: N/A;  . US ECHOCARDIOGRAPHY  04/2004   benign    Family History  Problem Relation Age of Onset  . Constipation Mother   . Diabetes Maternal Uncle     Social History   Social History  . Marital status: Widowed    Spouse name: N/A  . Number of children: N/A  . Years of education: N/A   Occupational History  . retired    Social History Main Topics  . Smoking status: Former Research scientist (life sciences)  . Smokeless tobacco: Never Used     Comment: quit over 20 years ago  . Alcohol use No  . Drug use: No  . Sexual activity: Not on file   Other Topics Concern  . Not on file   Social History Narrative   Religion affecting care--involved with Church throughout life (deacon, etc)      Has living will    Requests son [name]  as his health care power of attorney   Requests DNR--- done 10/15/14   No tube feeds if cognitively unaware            Review of Systems     Objective:   Physical Exam  Constitutional: He appears well-developed. No distress.  Neck: No thyromegaly present.  Cardiovascular: Normal rate, regular rhythm and normal heart sounds.  Exam reveals no gallop.   No murmur heard. Pulmonary/Chest: Effort normal and breath sounds normal. No respiratory distress. He has no wheezes. He has no rales.  Musculoskeletal:  Trace edema  Lymphadenopathy:    He has no cervical adenopathy.          Assessment & Plan:

## 2017-04-26 NOTE — Assessment & Plan Note (Signed)
BP Readings from Last 3 Encounters:  04/26/17 (!) 172/80  04/18/17 114/60  03/22/17 (!) 154/76   Will change the verapamil to extended and increase dose Goal to get under 160 systolic No ACEI given the worsening of renal function

## 2017-04-28 MED ORDER — VERAPAMIL HCL ER 120 MG PO CP24: 120.0000 mg | ORAL_CAPSULE | Freq: Every day | ORAL | 11 refills | Status: DC

## 2017-04-28 NOTE — Addendum Note (Signed)
Addended by: Eual FinesBRIDGES, Amandamarie Feggins P on: 04/28/2017 04:31 PM   Modules accepted: Orders

## 2017-05-01 ENCOUNTER — Other Ambulatory Visit: Payer: Self-pay | Admitting: Internal Medicine

## 2017-05-03 ENCOUNTER — Telehealth: Payer: Self-pay

## 2017-05-03 NOTE — Telephone Encounter (Signed)
Angel with [facility] is at pts home and wants to verify how pt is to take 3 meds; advised on current med list Gabapentin 100 mg one cap in morning and 2 caps at hs;  Metoprolol tartrate 25 mg taking one tab po bid and verapamil 120 mg 24 hour cap taking one cap po hs. Lawanna Kobusngel said nothing further needed.

## 2017-05-08 ENCOUNTER — Telehealth: Payer: Self-pay | Admitting: *Deleted

## 2017-05-08 NOTE — Telephone Encounter (Signed)
Lawanna KobusAngel, RN [facility] Pt's BP was elevated at her visit it was 168/70, she also states pt is very weak and tired and was falling a sleep for most of her visit saying he is really sleepy and weak feeling

## 2017-05-08 NOTE — Telephone Encounter (Signed)
That BP is fine for him. Please check on him Tuesday morning

## 2017-05-09 ENCOUNTER — Other Ambulatory Visit: Payer: Self-pay | Admitting: Internal Medicine

## 2017-05-09 NOTE — Telephone Encounter (Signed)
Please check on him again on Thursday Not sure what can be changed right now

## 2017-05-09 NOTE — Telephone Encounter (Signed)
Spoke to pt. He said he fell last night. He is still feeling sleepy.

## 2017-05-10 ENCOUNTER — Encounter: Payer: Self-pay | Admitting: Intensive Care

## 2017-05-10 ENCOUNTER — Emergency Department
Admission: EM | Admit: 2017-05-10 | Discharge: 2017-05-10 | Disposition: A | Discharge status: Home / Self Care | Payer: Medicare Other | Attending: Emergency Medicine | Admitting: Emergency Medicine

## 2017-05-10 ENCOUNTER — Emergency Department: Payer: Medicare Other

## 2017-05-10 DIAGNOSIS — S22088A Other fracture of T11-T12 vertebra, initial encounter for closed fracture: Secondary | ICD-10-CM | POA: Diagnosis not present

## 2017-05-10 DIAGNOSIS — E1122 Type 2 diabetes mellitus with diabetic chronic kidney disease: Secondary | ICD-10-CM | POA: Insufficient documentation

## 2017-05-10 DIAGNOSIS — Y9301 Activity, walking, marching and hiking: Secondary | ICD-10-CM | POA: Insufficient documentation

## 2017-05-10 DIAGNOSIS — Z7982 Long term (current) use of aspirin: Secondary | ICD-10-CM | POA: Diagnosis not present

## 2017-05-10 DIAGNOSIS — Z87891 Personal history of nicotine dependence: Secondary | ICD-10-CM | POA: Diagnosis not present

## 2017-05-10 DIAGNOSIS — I129 Hypertensive chronic kidney disease with stage 1 through stage 4 chronic kidney disease, or unspecified chronic kidney disease: Secondary | ICD-10-CM | POA: Insufficient documentation

## 2017-05-10 DIAGNOSIS — Y929 Unspecified place or not applicable: Secondary | ICD-10-CM | POA: Insufficient documentation

## 2017-05-10 DIAGNOSIS — J449 Chronic obstructive pulmonary disease, unspecified: Secondary | ICD-10-CM | POA: Insufficient documentation

## 2017-05-10 DIAGNOSIS — W010XXA Fall on same level from slipping, tripping and stumbling without subsequent striking against object, initial encounter: Secondary | ICD-10-CM | POA: Insufficient documentation

## 2017-05-10 DIAGNOSIS — Z79899 Other long term (current) drug therapy: Secondary | ICD-10-CM | POA: Insufficient documentation

## 2017-05-10 DIAGNOSIS — N183 Chronic kidney disease, stage 3 (moderate): Secondary | ICD-10-CM | POA: Insufficient documentation

## 2017-05-10 DIAGNOSIS — Y999 Unspecified external cause status: Secondary | ICD-10-CM | POA: Diagnosis not present

## 2017-05-10 DIAGNOSIS — Z9861 Coronary angioplasty status: Secondary | ICD-10-CM | POA: Insufficient documentation

## 2017-05-10 DIAGNOSIS — S3992XA Unspecified injury of lower back, initial encounter: Secondary | ICD-10-CM | POA: Diagnosis present

## 2017-05-10 DIAGNOSIS — Z7984 Long term (current) use of oral hypoglycemic drugs: Secondary | ICD-10-CM | POA: Insufficient documentation

## 2017-05-10 DIAGNOSIS — I25119 Atherosclerotic heart disease of native coronary artery with unspecified angina pectoris: Secondary | ICD-10-CM | POA: Insufficient documentation

## 2017-05-10 DIAGNOSIS — E1142 Type 2 diabetes mellitus with diabetic polyneuropathy: Secondary | ICD-10-CM | POA: Insufficient documentation

## 2017-05-10 MED ORDER — OXYCODONE-ACETAMINOPHEN 5-325 MG PO TABS
1.0000 | ORAL_TABLET | Freq: Once | ORAL | Status: AC
  Administered 2017-05-10: 1 via ORAL
  Filled 2017-05-10: qty 1

## 2017-05-10 NOTE — ED Triage Notes (Signed)
Patient fell on Monday at home on his Right knee and side and c/o pain lower back. HX diabetes. EMS vitals blood sugar 231, 162/85 b/p, pulse 83.

## 2017-05-10 NOTE — ED Provider Notes (Signed)
[facility] Emergency Department Provider Note  ____________________________________________   First MD Initiated Contact with Patient 05/10/17 [phone]     (approximate)  I have reviewed the triage vital signs and the nursing notes.   HISTORY  Chief Complaint No chief complaint on file.    HPI Evan [name] is a 83 y.o. male who comes to the emergency department via EMS with moderate to severe right low back pain and right thigh pain status post mechanical fall 2 days ago. he was walking with his walker when he slipped and fell landing on his right knee and then onto his right side. He had been able to get up and ambulate after the fall and yesterday, however today his pain was too intense and so his caretaker called 911. He's had no urinary or fecal incontinence. He has normal perianal sensation. He's had no chest pain shortness of breath abdominal pain nausea or vomiting.   Past Medical History:  Diagnosis Date  . Arthritis   . BPH (benign prostatic hypertrophy)   . CAD (coronary artery disease)   . Chronic kidney disease, stage III (moderate)   . CVA (cerebral vascular accident) ([place])   . Degenerative disc disease   . Diabetes mellitus   . Gastroenteritis   . GERD (gastroesophageal reflux disease)   . Hyperlipidemia   . Hypertension   . Polymyalgia rheumatica [facility])     Patient Active Problem List   Diagnosis Date Noted  . Edema 04/18/2017  . Chest pain 03/13/2017  . Aortic atherosclerosis ([place]) 02/17/2017  . Advance directive discussed with patient 12/26/2016  . COPD (chronic obstructive pulmonary disease) ([place]) 02/05/2016  . Thrombocytopenia ([place]) 02/05/2016  . Dysphagia 10/15/2015  . Mild malnutrition ([place]) 05/01/2015  . Nocturnal hypoxemia 06/12/2014  . Diabetes, polyneuropathy ([place]) 10/22/2013  . CKD stage 3 due to type 2 diabetes mellitus ([place]) 10/22/2013  . Type 2 diabetes mellitus with renal manifestations, controlled 10/22/2013    . Routine general medical examination at a health care facility 06/05/2013  . Chronic constipation 07/31/2012  . Polymyalgia rheumatica ([place]) 01/25/2010  . Essential hypertension, benign 08/28/2008  . [place] DISEASE, LUMBAR SPINE 08/28/2008  . OSTEOARTHRITIS 05/07/2007  . ATHEROSCLEROTIC CARDIOVASCULAR DISEASE 04/27/2007  . BENIGN PROSTATIC HYPERTROPHY 04/27/2007  . HYPERLIPIDEMIA 04/20/2007  . Atherosclerotic heart disease of native coronary artery with angina pectoris ([place]) 04/20/2007  . GERD 04/20/2007    Past Surgical History:  Procedure Laterality Date  . ANGIOPLASTY    . CATARACT EXTRACTION  1999 1992   glaucoma  . ESOPHAGEAL DILATION    . ESOPHAGOGASTRODUODENOSCOPY N/A 04/09/2015   Procedure: ESOPHAGOGASTRODUODENOSCOPY (EGD);  Surgeon: Josefine Class, MD;  Location[facility] ENDOSCOPY;  Service: Endoscopy;  Laterality: N/A;  . SAVORY DILATION N/A 04/09/2015   Procedure: SAVORY DILATION;  Surgeon: Josefine Class, MD;  Location: [facility] ENDOSCOPY;  Service: Endoscopy;  Laterality: N/A;  . US ECHOCARDIOGRAPHY  04/2004   benign    Prior to Admission medications   Medication Sig Start Date End Date Taking? Authorizing Provider  aspirin EC 81 MG tablet Take 81 mg by mouth daily.    [provider]  bimatoprost (LUMIGAN) 0.01 % SOLN Place 1 drop into both eyes at bedtime.    [provider]  Blood Glucose Monitoring Suppl (ONE TOUCH ULTRA 2) w/Device KIT 1 Device by Does not apply route once. Diagnosis: Diabetes Type 2 DX Code: E11.40 01/14/16   Venia Carbon, MD  fluticasone (FLONASE) 50 MCG/ACT nasal  spray Place 2 sprays into the nose daily. 01/20/17   [provider]  furosemide (LASIX) 20 MG tablet Take 1 tablet (20 mg total) by mouth daily as needed. For morning leg swelling 04/18/17   Venia Carbon, MD  gabapentin (NEURONTIN) 100 MG capsule Take 1 capsule in the morning and 2 capsules at bedtime. 01/31/17   Venia Carbon, MD   glipiZIDE (GLUCOTROL) 5 MG tablet Take 1 tablet (5 mg total) by mouth daily. 01/31/17   Venia Carbon, MD  GLUCERNA (GLUCERNA) LIQD Take 237 mLs by mouth 2 (two) times daily between meals.    [provider]  glucose blood (ONE TOUCH TEST STRIPS) test strip Test blood sugar 3 times a day. 01/14/16   Venia Carbon, MD  insulin lispro (HUMALOG) 100 UNIT/ML injection Inject 2 Units into the skin 3 (three) times daily before meals. Pt uses per sliding scale.    [provider]  isosorbide mononitrate (IMDUR) 30 MG 24 hr tablet Take 1 tablet (30 mg total) by mouth daily. 03/15/17   Demetrios Loll, MD  metoprolol tartrate (LOPRESSOR) 25 MG tablet Take 1 tablet (25 mg total) by mouth 2 (two) times daily. 03/15/17   Demetrios Loll, MD  nitroGLYCERIN (NITROSTAT) 0.4 MG SL tablet Place 1 tablet (0.4 mg total) under the tongue every 5 (five) minutes as needed for chest pain. 03/17/16   Venia Carbon, MD  omeprazole (PRILOSEC) 40 MG capsule Take 1 capsule (40 mg total) by mouth daily. 12/26/16   Venia Carbon, MD  [facility] DELICA LANCETS 65H MISC 1 strip by Does not apply route 3 (three) times daily with meals. Diagnosis: Diabetes Type 2 DX Code: E11.40 01/14/16   Viviana Simpler I, MD  polyethylene glycol (MIRALAX / GLYCOLAX) packet Take 17 g by mouth daily. Every other day    [provider]  pravastatin (PRAVACHOL) 40 MG tablet Take 1 tablet (40 mg total) by mouth daily. 01/31/17   Venia Carbon, MD  predniSONE (DELTASONE) 10 MG tablet Take 1 tablet (10 mg total) by mouth daily. 05/20/16   Venia Carbon, MD  tamsulosin (FLOMAX) 0.4 MG CAPS capsule Take 1 capsule (0.4 mg total) by mouth daily. 01/31/17   Venia Carbon, MD  timolol (TIMOPTIC) 0.5 % ophthalmic solution Place 1 drop into both eyes daily.    [provider]  traMADol (ULTRAM) 50 MG tablet TAKE  (1)  TABLET  THREE TIMES DAILY AS NEEDED FOR PAIN. 04/20/17   Venia Carbon, MD  verapamil (VERELAN PM) 120  MG 24 hr capsule Take 1 capsule (120 mg total) by mouth at bedtime. 04/28/17   Venia Carbon, MD    Allergies Penicillins  Family History  Problem Relation Age of Onset  . Constipation Mother   . Diabetes Maternal Uncle     Social History Social History  Substance Use Topics  . Smoking status: Former Research scientist (life sciences)  . Smokeless tobacco: Never Used     Comment: quit over 20 years ago  . Alcohol use No    Review of Systems Constitutional: No fever/chills Eyes: No visual changes. ENT: No sore throat. Cardiovascular: Denies chest pain. Respiratory: Denies shortness of breath. Gastrointestinal: No abdominal pain.  No nausea, no vomiting.  No diarrhea.  No constipation. Genitourinary: Negative for dysuria. Musculoskeletal: Positive for back pain. Skin: Negative for rash. Neurological: Negative for headaches, focal weakness or numbness.   ____________________________________________   PHYSICAL EXAM:  VITAL SIGNS: ED Triage Vitals  Enc Vitals Group     BP      Pulse      Resp      Temp      Temp src      SpO2      Weight      Height      Head Circumference      Peak Flow      Pain Score      Pain Loc      Pain Edu?      Excl. in [place]?     Constitutional: Pleasant cooperative appears uncomfortable leaning towards his left side Eyes: PERRL EOMI. Head: Atraumatic. Nose: No congestion/rhinnorhea. Mouth/Throat: No trismus Neck: No stridor.   Cardiovascular: Normal rate, regular rhythm. Grossly normal heart sounds.  Good peripheral circulation. Respiratory: Normal respiratory effort.  No retractions. Lungs CTAB and moving good air Gastrointestinal: Soft nontender Musculoskeletal: No midline back tenderness no paraspinal tenderness or spasm 5 out of 5 hip flexion and hip extension plantar flexion dorsiflexion Neurologic:  Normal speech and language. No gross focal neurologic deficits are appreciated. Skin:  Skin is warm, dry and intact. No rash noted. Psychiatric:  Mood and affect are normal. Speech and behavior are normal.    ________________________________________   LABS (all labs ordered are listed, but only abnormal results are displayed)  Labs Reviewed - No data to display   __________________________________________  EKG   ____________________________________________  RADIOLOGY  X-ray shows age indeterminate T12 fracture ____________________________________________   PROCEDURES  Procedure(s) performed: no  Procedures  Critical Care performed: no  Observation: no ____________________________________________   INITIAL IMPRESSION / ASSESSMENT AND PLAN / ED COURSE  Pertinent labs & imaging results that were available during my care of the patient were reviewed by me and considered in my medical decision making (see chart for details).  The patient arrives with right low back and right leg pain after mechanical fall. He has been able to ambulate. His postvoid residual is reassuring. I doubt spinal cord injury but I'm concern for fractures of plain films are pending. If plain films are negative he may need advanced imaging.     ----------------------------------------- 10:04 AM on 05/10/2017 -----------------------------------------  The patient's x-ray shows a new T12 endplate compression fracture. I will touch base with neurosurgery for recommendations but I still anticipate outpatient management. ____________________________________________  ----------------------------------------- 10:08 AM on 05/10/2017 -----------------------------------------  Dr. Izora Ribas recommends a TLSO brace and follow up in 4 weeks.  The patient was placed in his TLSO brace with immediate improvement in his pain. His family is at bedside and agrees that he is medically stable for outpatient management.  FINAL CLINICAL IMPRESSION(S) / ED DIAGNOSES  Final diagnoses:  None      NEW MEDICATIONS STARTED DURING THIS VISIT:  New  Prescriptions   No medications on file     Note:  This document was prepared using Dragon voice recognition software and may include unintentional dictation errors.     Darel Hong, MD 05/10/17 1217

## 2017-05-10 NOTE — ED Notes (Signed)
Patient back from  X-ray 

## 2017-05-10 NOTE — Discharge Instructions (Signed)
Please wear your back brace at all times until he follow up with the neurosurgeon in 4 weeks. Please return to the emergency disc department sooner for any concerns.  It was a pleasure to take care of you today, and thank you for coming to our emergency department.  If you have any questions or concerns before leaving please ask the nurse to grab me and I'm more than happy to go through your aftercare instructions again.  If you were prescribed any opioid pain medication today such as Norco, Vicodin, Percocet, morphine, hydrocodone, or oxycodone please make sure you do not drive when you are taking this medication as it can alter your ability to drive safely.  If you have any concerns once you are home that you are not improving or are in fact getting worse before you can make it to your follow-up appointment, please do not hesitate to call 911 and come back for further evaluation.  Merrily BrittleNeil Mayli Covington MD  Results for orders placed or performed in visit on 03/22/17  HM DIABETES EYE EXAM  Result Value Ref Range   HM Diabetic Eye Exam No Retinopathy No Retinopathy   Dg Lumbar Spine Complete  Result Date: 05/10/2017 CLINICAL DATA:  Low back pain since a fall 05/08/2017. EXAM: LUMBAR SPINE - COMPLETE 4+ VIEW COMPARISON:  CT abdomen and pelvis 02/17/2017. FINDINGS: The patient has a new mild superior endplate compression fracture of T12 with vertebral body height loss of approximately 15%. Vertebral body height is otherwise maintained. Intervertebral disc space height is normal. There is some facet degenerative disease lower lumbar spine. Bones appear osteopenic. Large colonic stool burden noted. IMPRESSION: Mild superior endplate compression fracture of T12 is new of since 02/17/2017. Osteopenia. Large colonic stool burden. Electronically Signed   By: Drusilla Kannerhomas  Dalessio M.D.   On: 05/10/2017 09:57   Dg Hip Unilat With Pelvis 2-3 Views Right  Result Date: 05/10/2017 CLINICAL DATA:  Fall 2 days ago onto right  side with low back pain. EXAM: DG HIP (WITH OR WITHOUT PELVIS) 2-3V RIGHT COMPARISON:  05/31/2014 and 06/26/2010 as well as CT 02/17/2017 FINDINGS: Mild diffuse osteopenia. Mild symmetric degenerative change of the hips. No acute fracture or dislocation. Mild degenerate change of the spine. IMPRESSION: No acute findings. Electronically Signed   By: Elberta Fortisaniel  Boyle M.D.   On: 05/10/2017 09:50

## 2017-05-11 ENCOUNTER — Other Ambulatory Visit: Payer: Self-pay | Admitting: Internal Medicine

## 2017-05-11 NOTE — Telephone Encounter (Signed)
Approved: 30 x 0 

## 2017-05-11 NOTE — Telephone Encounter (Signed)
Left message to call office

## 2017-05-11 NOTE — Telephone Encounter (Signed)
Last filled 04-27-17 #30 Last OV 04-26-17 No Future OV

## 2017-05-11 NOTE — Telephone Encounter (Signed)
Rx printed and waiting for signature 

## 2017-05-11 NOTE — Telephone Encounter (Signed)
[facility]

## 2017-05-12 ENCOUNTER — Other Ambulatory Visit: Payer: Self-pay | Admitting: Internal Medicine

## 2017-05-12 NOTE — Telephone Encounter (Signed)
Pt was in the ER again on 05-10-17. Left another message to call the office. Dr Alphonsus SiasLetvak is asking if he needs a home visit.

## 2017-05-16 ENCOUNTER — Telehealth: Payer: Self-pay

## 2017-05-16 NOTE — Telephone Encounter (Signed)
[facility] pharmacy left v/m wanting to know if pt is still taking tramadol; if pt is still taking tramadol need refill [facility]. The 05/11/17 tramadol rx that was printed was [facility]. [facility] request cb.

## 2017-05-16 NOTE — Telephone Encounter (Addendum)
I have called the home and mobile number multiple times with no answer. I also tried calling his emergency contact. Left her a message. I called Melburn PopperBarbara Jones, also. There are no notes in Epic stating he has been admitted anywhere.

## 2017-05-16 NOTE — Telephone Encounter (Signed)
[facility] [place] [facility] were asking to go ahead and get a rx for tramadol for next month. I advised it was too early. They will contact us closer to the end of July for the refill.

## 2017-05-17 ENCOUNTER — Telehealth: Payer: Self-pay

## 2017-05-17 ENCOUNTER — Telehealth: Payer: Self-pay | Admitting: Internal Medicine

## 2017-05-17 NOTE — Telephone Encounter (Signed)
Bonita QuinLinda returned Exxon Mobil CorporationShannon's call.

## 2017-05-17 NOTE — Telephone Encounter (Signed)
PLEASE NOTE: All timestamps contained within this report are represented as [place] Standard Time. CONFIDENTIALTY NOTICE: This fax transmission is intended only for the addressee. It contains information that is legally privileged, confidential or otherwise protected from use or disclosure. If you are not the intended recipient, you are strictly prohibited from reviewing, disclosing, copying using or disseminating any of this information or taking any action in reliance on or regarding this information. If you have received this fax in error, please notify us immediately by telephone so that we can arrange for its return to us. Phone: [phone], Toll-Free: [phone], Fax: [phone] Page: 1 of 1 Call Id: [id number] [facility] - Client Nonclinical Telephone Record [facility] Client [facility] - [facility] Site [facility] Day Physician Tillman AbideLetvak, Richard - MD Contact Type Call Who Is Calling Patient / Member / Family / Caregiver Caller Name Evan [name] Caller Phone Number [phone] Patient Name Evan [name] Call Type Message Only Information Provided Reason for Call Returning a Call from the Office Initial Comment Caller states that she was returning a missed call from Dr. Alphonsus SiasLetvak. Call Closed By: Tawni MillersKendra London Transaction Date/Time: 05/16/2017 5:05:22 PM (ET)

## 2017-05-17 NOTE — Telephone Encounter (Signed)
Well yes--he has CHF, etc Hopefully he will improve enough to get back home again

## 2017-05-17 NOTE — Telephone Encounter (Signed)
Spoke to [employer] InternationalLinda Moore (EC). She said he was admitted to [facility] [place] last week for the vertebral fracture. He was transferred to [facility] yesterday. She is going to see him today and keep us updated. She said they told him his heart was weak.

## 2017-05-17 NOTE — Telephone Encounter (Signed)
I spoke to his emergency contact, Herminio HeadsLinda Moore.

## 2017-05-17 NOTE — Telephone Encounter (Signed)
See previous note

## 2017-05-22 ENCOUNTER — Other Ambulatory Visit: Payer: Self-pay

## 2017-05-22 NOTE — Telephone Encounter (Signed)
Spoke to pt. He said he does not need the tramadol right now. [facility] until he moved to [facility] recently. They are giving him meds. Not his meds from home. He will be there for 20 days.  Called [facility] and left a message on the pharmacy line that the pt has been in the hospital and rehab since 05-10-17. He does not need the rx right now.

## 2017-05-22 NOTE — Telephone Encounter (Signed)
[facility] If [facility], okay to refill #30 x 0

## 2017-05-22 NOTE — Telephone Encounter (Signed)
Christine with [facility] pharmacy left v/m requesting refill tramadol; last refilled # 30 on 05/11/17; pt last seen 04/26/17.

## 2017-05-23 NOTE — Telephone Encounter (Signed)
[facility] left v/m that pt is in rehab and does not need tramadol refill and requested cb to confirm. I called [facility] pharmacy and spoke with Alycia Rossettiyan a pharmacist and advised info from 05/22/17; Alycia RossettiRyan voiced understanding.

## 2017-06-14 ENCOUNTER — Ambulatory Visit (INDEPENDENT_AMBULATORY_CARE_PROVIDER_SITE_OTHER): Payer: Medicare Other | Admitting: Internal Medicine

## 2017-06-14 ENCOUNTER — Encounter: Payer: Self-pay | Admitting: Internal Medicine

## 2017-06-14 VITALS — BP 122/80 | HR 76 | Temp 97.9°F | Wt 173.0 lb

## 2017-06-14 DIAGNOSIS — M353 Polymyalgia rheumatica: Secondary | ICD-10-CM | POA: Diagnosis not present

## 2017-06-14 DIAGNOSIS — S22080G Wedge compression fracture of T11-T12 vertebra, subsequent encounter for fracture with delayed healing: Secondary | ICD-10-CM

## 2017-06-14 DIAGNOSIS — I25119 Atherosclerotic heart disease of native coronary artery with unspecified angina pectoris: Secondary | ICD-10-CM

## 2017-06-14 DIAGNOSIS — N184 Chronic kidney disease, stage 4 (severe): Secondary | ICD-10-CM

## 2017-06-14 DIAGNOSIS — E1122 Type 2 diabetes mellitus with diabetic chronic kidney disease: Secondary | ICD-10-CM | POA: Diagnosis not present

## 2017-06-14 DIAGNOSIS — N183 Chronic kidney disease, stage 3 unspecified: Secondary | ICD-10-CM

## 2017-06-14 DIAGNOSIS — Z794 Long term (current) use of insulin: Secondary | ICD-10-CM

## 2017-06-14 DIAGNOSIS — S22089A Unspecified fracture of T11-T12 vertebra, initial encounter for closed fracture: Secondary | ICD-10-CM | POA: Insufficient documentation

## 2017-06-14 LAB — RENAL FUNCTION PANEL
Albumin: 3.1 g/dL — ABNORMAL LOW (ref 3.5–5.2)
BUN: 50 mg/dL — ABNORMAL HIGH (ref 6–23)
CHLORIDE: 107 meq/L (ref 96–112)
CO2: 31 meq/L (ref 19–32)
Calcium: 8.6 mg/dL (ref 8.4–10.5)
Creatinine, Ser: 2.26 mg/dL — ABNORMAL HIGH (ref 0.40–1.50)
GFR: 34.69 mL/min — AB (ref >60.00)
Glucose, Bld: 178 mg/dL — ABNORMAL HIGH (ref 70–99)
PHOSPHORUS: 3.7 mg/dL (ref 2.3–4.6)
POTASSIUM: 4.7 meq/L (ref 3.5–5.1)
SODIUM: 141 meq/L (ref 135–145)

## 2017-06-14 LAB — HEMOGLOBIN A1C: Hgb A1c MFr Bld: 6.9 % — ABNORMAL HIGH (ref 4.6–6.5)

## 2017-06-14 LAB — SEDIMENTATION RATE: SED RATE: 23 mm/h — AB (ref 0–20)

## 2017-06-14 NOTE — Assessment & Plan Note (Signed)
No current symptoms If sed rate normal--will change to 10/5 every other day--then 10 every other day

## 2017-06-14 NOTE — Assessment & Plan Note (Signed)
Discussed not being overly aggressive No insulin at all and no trajenta Goal A1c under 9% only

## 2017-06-14 NOTE — Assessment & Plan Note (Signed)
No recent chest pain but stable DOE No med changes needed

## 2017-06-14 NOTE — Patient Instructions (Signed)
Don't take any insulin. Let me know if your sugars are consistently over 200 when you are fasting. If the blood work is okay, please decrease the prednisone to 10mg  alternating with 5mg  every other day----for 2 weeks. If no problems then, go down to 10mg  every other day.

## 2017-06-14 NOTE — Addendum Note (Signed)
Addended by: Eual FinesBRIDGES, Drew Lips P on: 06/14/2017 09:28 AM   Modules accepted: Orders

## 2017-06-14 NOTE — Assessment & Plan Note (Signed)
Ongoing pain---tramadol and lidocaine patch Now unable to walk [facility]trying to arrange more care

## 2017-06-14 NOTE — Assessment & Plan Note (Signed)
Variable creatinine On low dose ACEI

## 2017-06-14 NOTE — Progress Notes (Signed)
Subjective:    Patient ID: Evan [name], male    DOB: Jun 30, 1918, 83 y.o.   MRN: [id number]  HPI Here for follow up of hospitalization at the [facility] after a fall and T12 fracture Then rehab stay at [facility] for almost a month--home 2 days ago Son is with him  Still with some pain at the fracture site Using the tramadol as needed Lidocaine patch also Not walking at all---has ramp into the house Wheelchair only--needs help even for transfers [facility] Day aide 2 hours in AM.  Son is staying with him for now  Checking sugars twice a day Running "good"--- all under 200 Put on levemir and trajenta at [facility]--not on these at home  No chest pain Some SOB--- with some exertion Sleeps in bed---no orthopnea Did have PND last night  Current Outpatient Prescriptions on File Prior to Visit  Medication Sig Dispense Refill  . aspirin EC 81 MG tablet Take 81 mg by mouth daily.    . bimatoprost (LUMIGAN) 0.01 % SOLN Place 1 drop into both eyes at bedtime.    . Blood Glucose Monitoring Suppl (ONE TOUCH ULTRA 2) w/Device KIT 1 Device by Does not apply route once. Diagnosis: Diabetes Type 2 DX Code: E11.40 1 each 0  . fluticasone (FLONASE) 50 MCG/ACT nasal spray Place 2 sprays into the nose daily.    . furosemide (LASIX) 20 MG tablet TAKE 1 TABLET BY MOUTH EVERY MORNING AS NEEDED FOR LEG SWELLING 30 tablet 10  . gabapentin (NEURONTIN) 100 MG capsule Take 1 capsule in the morning and 2 capsules at bedtime. 270 capsule 3  . glipiZIDE (GLUCOTROL) 5 MG tablet Take 1 tablet (5 mg total) by mouth daily. 90 tablet 3  . glucose blood (ONE TOUCH TEST STRIPS) test strip Test blood sugar 3 times a day. 100 each 5  . nitroGLYCERIN (NITROSTAT) 0.4 MG SL tablet Place 1 tablet (0.4 mg total) under the tongue every 5 (five) minutes as needed for chest pain. 25 tablet 2  . omeprazole (PRILOSEC) 40 MG capsule Take 1 capsule (40 mg total) by mouth daily. 90  capsule 3  . ONETOUCH DELICA LANCETS 39Q MISC 1 strip by Does not apply route 3 (three) times daily with meals. Diagnosis: Diabetes Type 2 DX Code: E11.40 100 each 5  . polyethylene glycol (MIRALAX / GLYCOLAX) packet Take 17 g by mouth daily. Every other day    . pravastatin (PRAVACHOL) 40 MG tablet Take 1 tablet (40 mg total) by mouth daily. 90 tablet 3  . predniSONE (DELTASONE) 10 MG tablet Take 1 tablet (10 mg total) by mouth daily. 30 tablet 11  . tamsulosin (FLOMAX) 0.4 MG CAPS capsule Take 1 capsule (0.4 mg total) by mouth daily. 90 capsule 3  . timolol (TIMOPTIC) 0.5 % ophthalmic solution Place 1 drop into both eyes daily.    . traMADol (ULTRAM) 50 MG tablet TAKE ONE (1) TABLET BY MOUTH THREE TIMES DAILY AS NEEDED FOR PAIN 30 tablet 0  . verapamil (VERELAN PM) 120 MG 24 hr capsule Take 1 capsule (120 mg total) by mouth at bedtime. 30 capsule 11   No current facility-administered medications on file prior to visit.     Allergies  Allergen Reactions  . Penicillins Rash and Other (See Comments)    Has patient had a PCN reaction causing immediate rash, facial/tongue/throat swelling, SOB or lightheadedness with hypotension: No Has patient had a PCN reaction causing  severe rash involving mucus membranes or skin necrosis: No Has patient had a PCN reaction that required hospitalization No Has patient had a PCN reaction occurring within the last 10 years: No If all of the above answers are "NO", then may proceed with Cephalosporin use.     Past Medical History:  Diagnosis Date  . Arthritis   . BPH (benign prostatic hypertrophy)   . CAD (coronary artery disease)   . Chronic kidney disease, stage III (moderate)   . CVA (cerebral vascular accident) ([place])   . Degenerative disc disease   . Diabetes mellitus   . Gastroenteritis   . GERD (gastroesophageal reflux disease)   . Hyperlipidemia   . Hypertension   . Polymyalgia rheumatica (HCC)     Past Surgical History:  Procedure  Laterality Date  . ANGIOPLASTY    . CATARACT EXTRACTION  1999 1992   glaucoma  . ESOPHAGEAL DILATION    . ESOPHAGOGASTRODUODENOSCOPY N/A 04/09/2015   Procedure: ESOPHAGOGASTRODUODENOSCOPY (EGD);  Surgeon: Josefine Class, MD;  Location: [facility] ENDOSCOPY;  Service: Endoscopy;  Laterality: N/A;  . SAVORY DILATION N/A 04/09/2015   Procedure: SAVORY DILATION;  Surgeon: Josefine Class, MD;  Location: [facility] ENDOSCOPY;  Service: Endoscopy;  Laterality: N/A;  . US ECHOCARDIOGRAPHY  04/2004   benign    Family History  Problem Relation Age of Onset  . Constipation Mother   . Diabetes Maternal Uncle     Social History   Social History  . Marital status: Widowed    Spouse name: N/A  . Number of children: N/A  . Years of education: N/A   Occupational History  . retired    Social History Main Topics  . Smoking status: Former Research scientist (life sciences)  . Smokeless tobacco: Never Used     Comment: quit over 20 years ago  . Alcohol use No  . Drug use: No  . Sexual activity: Not on file   Other Topics Concern  . Not on file   Social History Narrative   Religion affecting care--involved with Church throughout life (deacon, etc)      Has living will    Requests son [name]  as his health care power of attorney   Requests DNR--- done 10/15/14   No tube feeds if cognitively unaware            Review of Systems  Usually continent---keeps urinal close by. Wears depends but usually not wet Appetite is good Weight is up some     Objective:   Physical Exam  Constitutional: He appears well-developed. No distress.  Neck: No thyromegaly present.  Cardiovascular: Normal rate, regular rhythm and normal heart sounds.  Exam reveals no gallop.   No murmur heard. Pulmonary/Chest: Effort normal and breath sounds normal. No respiratory distress. He has no wheezes. He has no rales.  Musculoskeletal:  Slight right foot edema only  Lymphadenopathy:    He has no cervical adenopathy.  Psychiatric: He has a  normal mood and affect. His behavior is normal.          Assessment & Plan:

## 2017-06-15 ENCOUNTER — Telehealth: Payer: Self-pay | Admitting: *Deleted

## 2017-06-15 NOTE — Telephone Encounter (Signed)
I don't think speech therapy is needed

## 2017-06-15 NOTE — Telephone Encounter (Signed)
Left detailed msg on VM per HIPAA  

## 2017-06-15 NOTE — Telephone Encounter (Signed)
Spoke to [place] with [facility] who is requesting speech therapy 1/wk for 4weeks. Orders can be left on vm if is unavailable

## 2017-06-16 ENCOUNTER — Inpatient Hospital Stay: Admit: 2017-06-16 | Payer: Medicare Other

## 2017-06-16 ENCOUNTER — Emergency Department: Payer: Medicare Other

## 2017-06-16 ENCOUNTER — Encounter: Payer: Self-pay | Admitting: Intensive Care

## 2017-06-16 ENCOUNTER — Observation Stay
Admission: EM | Admit: 2017-06-16 | Discharge: 2017-06-18 | Disposition: A | Discharge status: Home / Self Care | Payer: Medicare Other | Attending: Internal Medicine | Admitting: Internal Medicine

## 2017-06-16 ENCOUNTER — Inpatient Hospital Stay: Payer: Medicare Other

## 2017-06-16 DIAGNOSIS — M1990 Unspecified osteoarthritis, unspecified site: Secondary | ICD-10-CM | POA: Diagnosis not present

## 2017-06-16 DIAGNOSIS — R778 Other specified abnormalities of plasma proteins: Secondary | ICD-10-CM

## 2017-06-16 DIAGNOSIS — I129 Hypertensive chronic kidney disease with stage 1 through stage 4 chronic kidney disease, or unspecified chronic kidney disease: Secondary | ICD-10-CM | POA: Insufficient documentation

## 2017-06-16 DIAGNOSIS — H409 Unspecified glaucoma: Secondary | ICD-10-CM | POA: Insufficient documentation

## 2017-06-16 DIAGNOSIS — N183 Chronic kidney disease, stage 3 (moderate): Secondary | ICD-10-CM | POA: Diagnosis not present

## 2017-06-16 DIAGNOSIS — R55 Syncope and collapse: Principal | ICD-10-CM | POA: Diagnosis present

## 2017-06-16 DIAGNOSIS — I69351 Hemiplegia and hemiparesis following cerebral infarction affecting right dominant side: Secondary | ICD-10-CM | POA: Diagnosis not present

## 2017-06-16 DIAGNOSIS — Z66 Do not resuscitate: Secondary | ICD-10-CM | POA: Insufficient documentation

## 2017-06-16 DIAGNOSIS — D696 Thrombocytopenia, unspecified: Secondary | ICD-10-CM | POA: Insufficient documentation

## 2017-06-16 DIAGNOSIS — E785 Hyperlipidemia, unspecified: Secondary | ICD-10-CM | POA: Diagnosis not present

## 2017-06-16 DIAGNOSIS — R0681 Apnea, not elsewhere classified: Secondary | ICD-10-CM | POA: Diagnosis not present

## 2017-06-16 DIAGNOSIS — R531 Weakness: Secondary | ICD-10-CM | POA: Diagnosis not present

## 2017-06-16 DIAGNOSIS — R4189 Other symptoms and signs involving cognitive functions and awareness: Secondary | ICD-10-CM | POA: Diagnosis present

## 2017-06-16 DIAGNOSIS — Z7982 Long term (current) use of aspirin: Secondary | ICD-10-CM | POA: Insufficient documentation

## 2017-06-16 DIAGNOSIS — R404 Transient alteration of awareness: Secondary | ICD-10-CM | POA: Diagnosis present

## 2017-06-16 DIAGNOSIS — K219 Gastro-esophageal reflux disease without esophagitis: Secondary | ICD-10-CM | POA: Diagnosis not present

## 2017-06-16 DIAGNOSIS — R609 Edema, unspecified: Secondary | ICD-10-CM

## 2017-06-16 DIAGNOSIS — R0602 Shortness of breath: Secondary | ICD-10-CM | POA: Insufficient documentation

## 2017-06-16 DIAGNOSIS — E1122 Type 2 diabetes mellitus with diabetic chronic kidney disease: Secondary | ICD-10-CM | POA: Diagnosis not present

## 2017-06-16 DIAGNOSIS — Z7984 Long term (current) use of oral hypoglycemic drugs: Secondary | ICD-10-CM | POA: Diagnosis not present

## 2017-06-16 DIAGNOSIS — N400 Enlarged prostate without lower urinary tract symptoms: Secondary | ICD-10-CM | POA: Diagnosis not present

## 2017-06-16 DIAGNOSIS — M353 Polymyalgia rheumatica: Secondary | ICD-10-CM | POA: Diagnosis not present

## 2017-06-16 DIAGNOSIS — Z880 Allergy status to penicillin: Secondary | ICD-10-CM | POA: Diagnosis not present

## 2017-06-16 DIAGNOSIS — R740 Nonspecific elevation of levels of transaminase and lactic acid dehydrogenase [LDH]: Secondary | ICD-10-CM | POA: Diagnosis not present

## 2017-06-16 DIAGNOSIS — I080 Rheumatic disorders of both mitral and aortic valves: Secondary | ICD-10-CM | POA: Diagnosis not present

## 2017-06-16 DIAGNOSIS — Z79899 Other long term (current) drug therapy: Secondary | ICD-10-CM | POA: Insufficient documentation

## 2017-06-16 DIAGNOSIS — Z87891 Personal history of nicotine dependence: Secondary | ICD-10-CM | POA: Insufficient documentation

## 2017-06-16 DIAGNOSIS — N189 Chronic kidney disease, unspecified: Secondary | ICD-10-CM

## 2017-06-16 DIAGNOSIS — I2510 Atherosclerotic heart disease of native coronary artery without angina pectoris: Secondary | ICD-10-CM | POA: Insufficient documentation

## 2017-06-16 DIAGNOSIS — R7989 Other specified abnormal findings of blood chemistry: Secondary | ICD-10-CM | POA: Diagnosis not present

## 2017-06-16 LAB — TROPONIN I
TROPONIN I: 0.1 ng/mL — AB (ref <0.03)
Troponin I: 0.1 ng/mL (ref <0.03)
Troponin I: 0.1 ng/mL (ref <0.03)

## 2017-06-16 LAB — ETHANOL: Alcohol, Ethyl (B): <5 mg/dL (ref <5)

## 2017-06-16 LAB — COMPREHENSIVE METABOLIC PANEL
ALBUMIN: 3.1 g/dL — AB (ref 3.5–5.0)
ALK PHOS: 151 U/L — AB (ref 38–126)
ALT: 31 U/L (ref 17–63)
AST: 57 U/L — AB (ref 15–41)
Anion gap: 9 (ref 5–15)
BUN: 56 mg/dL — AB (ref 6–20)
CALCIUM: 8.3 mg/dL — AB (ref 8.9–10.3)
CHLORIDE: 105 mmol/L (ref 101–111)
CO2: 25 mmol/L (ref 22–32)
CREATININE: 2.52 mg/dL — AB (ref 0.61–1.24)
GFR calc non Af Amer: 20 mL/min — ABNORMAL LOW (ref >60)
GFR, EST AFRICAN AMERICAN: 23 mL/min — AB (ref >60)
GLUCOSE: 231 mg/dL — AB (ref 65–99)
Potassium: 4.5 mmol/L (ref 3.5–5.1)
Sodium: 139 mmol/L (ref 135–145)
Total Bilirubin: 0.8 mg/dL (ref 0.3–1.2)
Total Protein: 6.6 g/dL (ref 6.5–8.1)

## 2017-06-16 LAB — URINE DRUG SCREEN, QUALITATIVE (ARMC ONLY)
Amphetamines, Ur Screen: NOT DETECTED
BARBITURATES, UR SCREEN: NOT DETECTED
Benzodiazepine, Ur Scrn: NOT DETECTED
CANNABINOID 50 NG, UR ~~LOC~~: NOT DETECTED
COCAINE METABOLITE, UR ~~LOC~~: NOT DETECTED
MDMA (ECSTASY) UR SCREEN: NOT DETECTED
Methadone Scn, Ur: NOT DETECTED
Opiate, Ur Screen: NOT DETECTED
PHENCYCLIDINE (PCP) UR S: NOT DETECTED
Tricyclic, Ur Screen: NOT DETECTED

## 2017-06-16 LAB — CBC WITH DIFFERENTIAL/PLATELET
BASOS ABS: 0 10*3/uL (ref 0–0.1)
BASOS PCT: 1 %
Eosinophils Absolute: 0.2 10*3/uL (ref 0–0.7)
Eosinophils Relative: 3 %
HEMATOCRIT: 30.1 % — AB (ref 40.0–52.0)
Hemoglobin: 9.6 g/dL — ABNORMAL LOW (ref 13.0–18.0)
LYMPHS PCT: 30 %
Lymphs Abs: 2.3 10*3/uL (ref 1.0–3.6)
MCH: 29.1 pg (ref 26.0–34.0)
MCHC: 32 g/dL (ref 32.0–36.0)
MCV: 90.7 fL (ref 80.0–100.0)
MONO ABS: 0.7 10*3/uL (ref 0.2–1.0)
Monocytes Relative: 10 %
NEUTROS ABS: 4.3 10*3/uL (ref 1.4–6.5)
NEUTROS PCT: 56 %
PLATELETS: 117 10*3/uL — AB (ref 150–440)
RBC: 3.31 MIL/uL — AB (ref 4.40–5.90)
RDW: 15.3 % — AB (ref 11.5–14.5)
WBC: 7.5 10*3/uL (ref 3.8–10.6)

## 2017-06-16 LAB — T4, FREE: Free T4: 1.53 ng/dL — ABNORMAL HIGH (ref 0.61–1.12)

## 2017-06-16 LAB — TSH: TSH: 3.539 u[IU]/mL (ref 0.350–4.500)

## 2017-06-16 LAB — URINALYSIS, ROUTINE W REFLEX MICROSCOPIC
BILIRUBIN URINE: NEGATIVE
Bacteria, UA: NONE SEEN
GLUCOSE, UA: 50 mg/dL — AB
HGB URINE DIPSTICK: NEGATIVE
Ketones, ur: NEGATIVE mg/dL
NITRITE: NEGATIVE
Protein, ur: 100 mg/dL — AB
SPECIFIC GRAVITY, URINE: 1.014 (ref 1.005–1.030)
pH: 5 (ref 5.0–8.0)

## 2017-06-16 LAB — APTT

## 2017-06-16 LAB — LACTIC ACID, PLASMA: LACTIC ACID, VENOUS: 2.5 mmol/L — AB (ref 0.5–1.9)

## 2017-06-16 LAB — LIPASE, BLOOD: Lipase: 30 U/L (ref 11–51)

## 2017-06-16 LAB — PROTIME-INR
INR: 1.15
Prothrombin Time: 14.8 seconds (ref 11.4–15.2)

## 2017-06-16 LAB — BRAIN NATRIURETIC PEPTIDE: B Natriuretic Peptide: 1951 pg/mL — ABNORMAL HIGH (ref 0.0–100.0)

## 2017-06-16 LAB — ACETAMINOPHEN LEVEL: Acetaminophen (Tylenol), Serum: <10 ug/mL — ABNORMAL LOW (ref 10–30)

## 2017-06-16 MED ORDER — GLIPIZIDE 5 MG PO TABS
5.0000 mg | ORAL_TABLET | Freq: Every day | ORAL | Status: DC
  Administered 2017-06-17: 5 mg via ORAL
  Filled 2017-06-16: qty 1

## 2017-06-16 MED ORDER — FUROSEMIDE 20 MG PO TABS
20.0000 mg | ORAL_TABLET | Freq: Every day | ORAL | Status: DC
  Administered 2017-06-17 – 2017-06-18 (×2): 20 mg via ORAL
  Filled 2017-06-16 (×2): qty 1

## 2017-06-16 MED ORDER — TECHNETIUM TC 99M DIETHYLENETRIAME-PENTAACETIC ACID
32.1200 | Freq: Once | INTRAVENOUS | Status: AC | PRN
  Administered 2017-06-16: 32.12 via RESPIRATORY_TRACT

## 2017-06-16 MED ORDER — ASPIRIN EC 81 MG PO TBEC
81.0000 mg | DELAYED_RELEASE_TABLET | Freq: Every day | ORAL | Status: DC
  Administered 2017-06-16 – 2017-06-18 (×3): 81 mg via ORAL
  Filled 2017-06-16 (×3): qty 1

## 2017-06-16 MED ORDER — NALOXONE HCL 2 MG/2ML IJ SOSY
0.4000 mg | PREFILLED_SYRINGE | INTRAMUSCULAR | Status: AC
  Administered 2017-06-16: 0.4 mg via INTRAVENOUS
  Filled 2017-06-16: qty 2

## 2017-06-16 MED ORDER — PRAVASTATIN SODIUM 40 MG PO TABS
40.0000 mg | ORAL_TABLET | Freq: Every evening | ORAL | Status: DC
  Administered 2017-06-16 – 2017-06-17 (×2): 40 mg via ORAL
  Filled 2017-06-16 (×2): qty 1

## 2017-06-16 MED ORDER — HYDRALAZINE HCL 50 MG PO TABS: 50.0000 mg | ORAL_TABLET | Freq: Four times a day (QID) | ORAL | Status: DC | PRN

## 2017-06-16 MED ORDER — HEPARIN (PORCINE) IN NACL 100-0.45 UNIT/ML-% IJ SOLN
900.0000 [IU]/h | INTRAMUSCULAR | Status: DC
  Administered 2017-06-16: 900 [IU]/h via INTRAVENOUS
  Filled 2017-06-16: qty 250

## 2017-06-16 MED ORDER — LISINOPRIL 10 MG PO TABS
10.0000 mg | ORAL_TABLET | Freq: Every day | ORAL | Status: DC
  Administered 2017-06-17 – 2017-06-18 (×2): 10 mg via ORAL
  Filled 2017-06-16 (×2): qty 1

## 2017-06-16 MED ORDER — TECHNETIUM TO 99M ALBUMIN AGGREGATED
4.0000 | Freq: Once | INTRAVENOUS | Status: AC | PRN
  Administered 2017-06-16: 4.35 via INTRAVENOUS

## 2017-06-16 MED ORDER — VERAPAMIL HCL 80 MG PO TABS
80.0000 mg | ORAL_TABLET | Freq: Two times a day (BID) | ORAL | Status: DC
  Administered 2017-06-16 – 2017-06-18 (×3): 80 mg via ORAL
  Filled 2017-06-16 (×5): qty 1

## 2017-06-16 MED ORDER — POLYETHYLENE GLYCOL 3350 17 G PO PACK
17.0000 g | PACK | Freq: Every day | ORAL | Status: DC
  Administered 2017-06-17: 17 g via ORAL
  Filled 2017-06-16: qty 1

## 2017-06-16 MED ORDER — TRAMADOL HCL 50 MG PO TABS: 50.0000 mg | ORAL_TABLET | Freq: Two times a day (BID) | ORAL | Status: DC | PRN

## 2017-06-16 MED ORDER — PREDNISONE 10 MG PO TABS
10.0000 mg | ORAL_TABLET | Freq: Every day | ORAL | Status: DC
  Administered 2017-06-18: 10 mg via ORAL
  Filled 2017-06-16: qty 1

## 2017-06-16 MED ORDER — ACETAMINOPHEN 325 MG PO TABS: 650.0000 mg | ORAL_TABLET | Freq: Four times a day (QID) | ORAL | Status: DC | PRN

## 2017-06-16 MED ORDER — CALCITONIN (SALMON) 200 UNIT/ACT NA SOLN: 1.0000 | Freq: Every day | NASAL | Status: DC

## 2017-06-16 MED ORDER — FLUTICASONE PROPIONATE 50 MCG/ACT NA SUSP
2.0000 | Freq: Every day | NASAL | Status: DC
  Administered 2017-06-17 – 2017-06-18 (×2): 2 via NASAL
  Filled 2017-06-16: qty 16

## 2017-06-16 MED ORDER — SODIUM CHLORIDE 0.9 % IV BOLUS (SEPSIS): 1000.0000 mL | INTRAVENOUS | Status: DC

## 2017-06-16 MED ORDER — SODIUM CHLORIDE 0.9 % IV BOLUS (SEPSIS)
500.0000 mL | INTRAVENOUS | Status: AC
  Administered 2017-06-16: 500 mL via INTRAVENOUS

## 2017-06-16 MED ORDER — TAMSULOSIN HCL 0.4 MG PO CAPS
0.4000 mg | ORAL_CAPSULE | Freq: Every day | ORAL | Status: DC
  Administered 2017-06-17 – 2017-06-18 (×2): 0.4 mg via ORAL
  Filled 2017-06-16 (×2): qty 1

## 2017-06-16 MED ORDER — ACETAMINOPHEN 650 MG RE SUPP: 650.0000 mg | Freq: Four times a day (QID) | RECTAL | Status: DC | PRN

## 2017-06-16 MED ORDER — BRIMONIDINE TARTRATE 0.2 % OP SOLN
1.0000 [drp] | Freq: Two times a day (BID) | OPHTHALMIC | Status: DC
  Administered 2017-06-16 – 2017-06-18 (×4): 1 [drp] via OPHTHALMIC
  Filled 2017-06-16: qty 5

## 2017-06-16 MED ORDER — HEPARIN BOLUS VIA INFUSION
4000.0000 [IU] | Freq: Once | INTRAVENOUS | Status: AC
  Administered 2017-06-16: 4000 [IU] via INTRAVENOUS
  Filled 2017-06-16: qty 4000

## 2017-06-16 MED ORDER — TIMOLOL MALEATE 0.5 % OP SOLN
1.0000 [drp] | Freq: Every day | OPHTHALMIC | Status: DC
  Administered 2017-06-16 – 2017-06-18 (×2): 1 [drp] via OPHTHALMIC
  Filled 2017-06-16: qty 5

## 2017-06-16 MED ORDER — SODIUM CHLORIDE 0.9 % IV SOLN
INTRAVENOUS | Status: DC
  Administered 2017-06-16 – 2017-06-17 (×2): via INTRAVENOUS

## 2017-06-16 NOTE — ED Triage Notes (Addendum)
EMS [place] for cardiac arrest. Pt was leaving his home to get into car [facility] and while in sons car patient had a syncopal episode. When EMS arrived after call son was doing chest compressions on concrete. Upon arrival patient was breathing and responding. Pt has long periods of apnea

## 2017-06-16 NOTE — Progress Notes (Signed)
ANTICOAGULATION CONSULT NOTE - Initial Consult  Pharmacy Consult for Heparin Drip  Indication: chest pain/ACS  Allergies  Allergen Reactions  . Penicillins Rash and Other (See Comments)    Has patient had a PCN reaction causing immediate rash, facial/tongue/throat swelling, SOB or lightheadedness with hypotension: No Has patient had a PCN reaction causing severe rash involving mucus membranes or skin necrosis: No Has patient had a PCN reaction that required hospitalization No Has patient had a PCN reaction occurring within the last 10 years: No If all of the above answers are "NO", then may proceed with Cephalosporin use.     Patient Measurements: Height: 6\' 2"  (188 cm) Weight: 173 lb (78.5 kg) IBW/kg (Calculated) : 82.2  Vital Signs: Temp: 99.1 F (37.3 C) (08/10 1157) Temp Source: Rectal (08/10 1157) BP: 150/83 (08/10 1300) Pulse Rate: 59 (08/10 1315)  Labs:  Recent Labs  06/14/17 0938 06/16/17 1122  HGB  --  9.6*  HCT  --  30.1*  PLT  --  117*  LABPROT  --  14.8  INR  --  1.15  CREATININE 2.26* 2.52*  TROPONINI  --  0.10*    Estimated Creatinine Clearance: 18.6 mL/min (A) (by C-G formula based on SCr of 2.52 mg/dL (H)).   Medical History: Past Medical History:  Diagnosis Date  . Arthritis   . BPH (benign prostatic hypertrophy)   . CAD (coronary artery disease)   . Chronic kidney disease, stage III (moderate)   . CVA (cerebral vascular accident) (HCC)   . Degenerative disc disease   . Diabetes mellitus   . Gastroenteritis   . GERD (gastroesophageal reflux disease)   . Hyperlipidemia   . Hypertension   . Polymyalgia rheumatica (HCC)    Assessment: Pharmacy consulted for heparin dosing in a [age] yo male who was found unresponsive in field. Patient did received CPR and now has borderline troponin.   Goal of Therapy:  Heparin level 0.3-0.7 units/ml Monitor platelets by anticoagulation protocol: Yes   Plan:  Baseline labs ordered Give 4000 units bolus  x 1 Start heparin infusion at 900 units/hr Check anti-Xa level in 8 hours and daily while on heparin Continue to monitor H&H and platelets  Gardner CandleSheema M Micha Erck, PharmD, BCPS Clinical Pharmacist 06/16/2017 3:14 PM

## 2017-06-16 NOTE — H&P (Signed)
[place] [place] NAME: Evan [name]    MR#:  [id number]  DATE OF BIRTH:  04-Jun-1918  DATE OF ADMISSION:  06/16/2017  PRIMARY CARE PHYSICIAN: Venia Carbon, MD   REQUESTING/REFERRING PHYSICIAN: Dr Hinda Kehr  CHIEF COMPLAINT:   Chief Complaint  Patient presents with  . Loss of Consciousness    HISTORY OF PRESENT ILLNESS:  Evan [name]  is a 83 y.o. male who recently had a thoracic compression fracture and was recently discharged from rehabilitation. His son moved here to help take care of him. He was on his way to the [place] for appointments when he put him in the car and he went back to the house to get a drink and when he came back to the car his father was having shallow breathing. He called 911 and they told him to do chest compressions. EMS took over doing chest compressions and patient came back. Patient has episodes of not breathing. The patient feels okay right now. Occasional shortness of breath. Occasional chest pain but none currently. Patient and son reversed their DO NOT RESUSCITATE order and now are full code.  PAST MEDICAL HISTORY:   Past Medical History:  Diagnosis Date  . Arthritis   . BPH (benign prostatic hypertrophy)   . CAD (coronary artery disease)   . Chronic kidney disease, stage III (moderate)   . CVA (cerebral vascular accident) ([place])   . Degenerative disc disease   . Diabetes mellitus   . Gastroenteritis   . GERD (gastroesophageal reflux disease)   . Hyperlipidemia   . Hypertension   . Polymyalgia rheumatica ([place])     PAST SURGICAL HISTORY:   Past Surgical History:  Procedure Laterality Date  . ANGIOPLASTY    . CATARACT EXTRACTION  1999 1992   glaucoma  . ESOPHAGEAL DILATION    . ESOPHAGOGASTRODUODENOSCOPY N/A 04/09/2015   Procedure: ESOPHAGOGASTRODUODENOSCOPY (EGD);  Surgeon: Josefine Class, MD;  Location: [facility] ENDOSCOPY;  Service: Endoscopy;  Laterality: N/A;  . SAVORY DILATION  N/A 04/09/2015   Procedure: SAVORY DILATION;  Surgeon: Josefine Class, MD;  Location: [facility] ENDOSCOPY;  Service: Endoscopy;  Laterality: N/A;  . US ECHOCARDIOGRAPHY  04/2004   benign    SOCIAL HISTORY:   Social History  Substance Use Topics  . Smoking status: Former Research scientist (life sciences)  . Smokeless tobacco: Never Used     Comment: quit over 20 years ago  . Alcohol use No    FAMILY HISTORY:   Family History  Problem Relation Age of Onset  . Constipation Mother   . Diabetes Maternal Uncle     DRUG ALLERGIES:   Allergies  Allergen Reactions  . Penicillins Rash and Other (See Comments)    Has patient had a PCN reaction causing immediate rash, facial/tongue/throat swelling, SOB or lightheadedness with hypotension: No Has patient had a PCN reaction causing severe rash involving mucus membranes or skin necrosis: No Has patient had a PCN reaction that required hospitalization No Has patient had a PCN reaction occurring within the last 10 years: No If all of the above answers are "NO", then may proceed with Cephalosporin use.     REVIEW OF SYSTEMS:  CONSTITUTIONAL: Positive for chills. Positive for weight loss then weight gain. No  fever, positive for fatigue.  EYES: No blurred or double vision. Wears glasses EARS, NOSE, AND THROAT: No tinnitus or ear pain. No sore throat. Decreased hearing RESPIRATORY: Some cough, occasional shortness of breath, no wheezing or hemoptysis.  CARDIOVASCULAR: Occasional chest pain, no orthopnea, edema.  GASTROINTESTINAL: No nausea, vomiting, diarrhea or abdominal pain. No blood in bowel movements. Positive for constipation GENITOURINARY: No dysuria, hematuria.  ENDOCRINE: No polyuria, nocturia,  HEMATOLOGY: No anemia, easy bruising or bleeding SKIN: No rash or lesion. Itching on the legs MUSCULOSKELETAL: Positive for joint pain   NEUROLOGIC: No tingling, numbness, weakness.  PSYCHIATRY: No anxiety or depression.   MEDICATIONS AT HOME:   Prior to  Admission medications   Medication Sig Start Date End Date Taking? Authorizing Provider  aspirin EC 81 MG tablet Take 81 mg by mouth daily.   Yes [provider]  Blood Glucose Monitoring Suppl (ONE TOUCH ULTRA 2) w/Device KIT 1 Device by Does not apply route once. Diagnosis: Diabetes Type 2 DX Code: E11.40 01/14/16  Yes Viviana Simpler I, MD  brimonidine (ALPHAGAN) 0.2 % ophthalmic solution Place into both eyes 2 (two) times daily.   Yes [provider]  furosemide (LASIX) 20 MG tablet TAKE 1 TABLET BY MOUTH EVERY MORNING AS NEEDED FOR LEG SWELLING 05/11/17  Yes Viviana Simpler I, MD  glipiZIDE (GLUCOTROL) 5 MG tablet Take 1 tablet (5 mg total) by mouth daily. 01/31/17  Yes Viviana Simpler I, MD  glucose blood (ONE TOUCH TEST STRIPS) test strip Test blood sugar 3 times a day. 01/14/16  Yes Venia Carbon, MD  lisinopril (PRINIVIL,ZESTRIL) 10 MG tablet Take 10 mg by mouth daily.    Yes [provider]  [facility] DELICA LANCETS 66Y MISC 1 strip by Does not apply route 3 (three) times daily with meals. Diagnosis: Diabetes Type 2 DX Code: E11.40 01/14/16  Yes Venia Carbon, MD  pravastatin (PRAVACHOL) 40 MG tablet Take 1 tablet (40 mg total) by mouth daily. 01/31/17  Yes Venia Carbon, MD  predniSONE (DELTASONE) 10 MG tablet Take 1 tablet (10 mg total) by mouth daily. 05/20/16  Yes Venia Carbon, MD  tamsulosin (FLOMAX) 0.4 MG CAPS capsule Take 1 capsule (0.4 mg total) by mouth daily. 01/31/17  Yes Venia Carbon, MD  timolol (TIMOPTIC) 0.5 % ophthalmic solution Place 1 drop into both eyes daily.   Yes [provider]  traMADol (ULTRAM) 50 MG tablet TAKE ONE (1) TABLET BY MOUTH THREE TIMES DAILY AS NEEDED FOR PAIN 05/11/17  Yes Venia Carbon, MD  calcitonin, salmon, (MIACALCIN/FORTICAL) 200 UNIT/ACT nasal spray Place 1 spray into alternate nostrils daily.    [provider]  fluticasone (FLONASE) 50 MCG/ACT nasal spray Place 2 sprays into the nose  daily. 01/20/17   [provider]  hydrALAZINE (APRESOLINE) 50 MG tablet Take 50 mg by mouth 4 (four) times daily as needed.    [provider]  polyethylene glycol (MIRALAX / GLYCOLAX) packet Take 17 g by mouth daily. Every other day    [provider]  verapamil (CALAN) 80 MG tablet Take 80 mg by mouth 2 (two) times daily.    [provider]      VITAL SIGNS:  Blood pressure (!) 150/83, pulse (!) 59, temperature 99.1 F (37.3 C), temperature source Rectal, resp. rate (!) 9, height '6\' 2"'$  (1.88 m), weight 78.5 kg (173 lb), SpO2 100 %.  PHYSICAL EXAMINATION:  GENERAL:  83 y.o.-year-old patient lying in the bed with no acute distress.  EYES: Pupils equal, round, reactive to light and accommodation. No scleral icterus. Extraocular muscles intact.  HEENT: Head atraumatic, normocephalic. Oropharynx and nasopharynx clear.  NECK:  Supple, no jugular venous distention. No thyroid enlargement, no  tenderness.  LUNGS: Normal breath sounds bilaterally, no wheezing, rales,rhonchi or crepitation. No use of accessory muscles of respiration.  CARDIOVASCULAR: S1, S2 normal. No murmurs, rubs, or gallops.  ABDOMEN: Soft, nontender, nondistended. Bowel sounds present. No organomegaly or mass.  EXTREMITIES: No pedal edema, cyanosis, or clubbing.  NEUROLOGIC: Cranial nerves II through XII are intact. Muscle strength 5/5 in all extremities. Sensation intact. Gait not checked.  PSYCHIATRIC: The patient is alert and oriented x 3.  SKIN: No rash, lesion, or ulcer.   LABORATORY PANEL:   CBC  Recent Labs Lab 06/16/17 1122  WBC 7.5  HGB 9.6*  HCT 30.1*  PLT 117*   ------------------------------------------------------------------------------------------------------------------  Chemistries   Recent Labs Lab 06/16/17 1122  NA 139  K 4.5  CL 105  CO2 25  GLUCOSE 231*  BUN 56*  CREATININE 2.52*  CALCIUM 8.3*  AST 57*  ALT 31  ALKPHOS 151*  BILITOT 0.8    ------------------------------------------------------------------------------------------------------------------  Cardiac Enzymes  Recent Labs Lab 06/16/17 1122  TROPONINI 0.10*   ------------------------------------------------------------------------------------------------------------------  RADIOLOGY:  Ct Head Wo Contrast  Result Date: 06/16/2017 CLINICAL DATA:  Cardiac arrest. EXAM: CT HEAD WITHOUT CONTRAST TECHNIQUE: Contiguous axial images were obtained from the base of the skull through the vertex without intravenous contrast. COMPARISON:  CT head dated May 08, 2006. FINDINGS: Brain: No evidence of acute infarction, hemorrhage, hydrocephalus, extra-axial collection or mass lesion/mass effect. Age-related cerebral atrophy with compensatory dilatation of the ventricles. Periventricular white matter and corona radiata hypodensities favor chronic ischemic microvascular white matter disease. Vascular: Intracranial atherosclerotic vascular calcifications. No hyperdense vessel. Skull: Normal. Negative for fracture or focal lesion. Sinuses/Orbits: The bilateral paranasal sinuses and mastoid air cells are clear. The orbits are unremarkable. Other: None. IMPRESSION: The 1.  No acute intracranial abnormality. Electronically Signed   By: Titus Dubin M.D.   On: 06/16/2017 12:38   Dg Chest Port 1 View  Result Date: 06/16/2017 CLINICAL DATA:  Altered mental status.  Periods of apnea. EXAM: PORTABLE CHEST 1 VIEW COMPARISON:  03/13/2017 FINDINGS: New mild cardiomegaly. Pulmonary vascularity is at the upper limits of normal. New small right pleural effusion. Chronic accentuation of the interstitial markings at the lung bases. Tortuosity and calcification of the thoracic aorta. IMPRESSION: 1. New mild cardiomegaly. 2. New small right pleural effusion. 3. Chronic interstitial lung disease at the lung bases. Electronically Signed   By: Lorriane Shire M.D.   On: 06/16/2017 11:51    EKG:   Normal  sinus rhythm flipped T waves laterally  IMPRESSION AND PLAN:   1. Unresponsive episode in which CPR was done in the field. Unclear etiology. Borderline troponin but the patient has chronic kidney disease. Obtain a VQ scan and ultrasound the lower extremities. Serial cardiac enzymes and monitor on telemetry cardiology consultation. Continue aspirin. Start heparin drip. 2. Apneic episodes. Overnight oximetry to see if he qualifies for nighttime oxygen. 3. Type 2 diabetes mellitus. Continue oral medications and place on sliding scale 4. Acute on Chronic kidney disease stage III with creatinine slightly higher. Gentle IV fluid. 5. History of CVA with right-sided weakness 6. Essential hypertension on verapamil and lisinopril 7. Thrombocytopenia chronic in nature 8. Glaucoma continue eyedrops  All the records are reviewed and case discussed with ED provider. Management plans discussed with the patient, family and they are in agreement.  CODE STATUS: Full code  TOTAL TIME TAKING CARE OF THIS PATIENT: 50 minutes.    Loletha Grayer M.D on 06/16/2017 at 2:57 PM  Between 7am to 6pm -  Pager - [phone]  After 6pm call admission pager [phone]  [facility] Office  [phone]  CC: Primary care physician; Venia Carbon, MD

## 2017-06-16 NOTE — ED Notes (Signed)
Sp[facility] to [facility], [place] [facility], she indicated [facility] is on full divert for transfers  [phone]

## 2017-06-16 NOTE — ED Provider Notes (Signed)
[facility] Emergency Department Provider Note  ____________________________________________   First MD Initiated Contact with Patient 06/16/17 1121     (approximate)  I have reviewed the triage vital signs and the nursing notes.   HISTORY  Chief Complaint Loss of Consciousness  Level 5 caveat:  history/ROS limited by acute/critical illness  HPI Evan [name] is a 83 y.o. male who arrives by EMS for evaluation of an episode of unresponsiveness witnessed by his son.  His son is from out of town and is visiting the patientwho has been living independently for 10 years in spite of his advanced age.  His son feels that the patient has been declining noticeably over the last few weeks.  He had a fall in the recent past that resulted in a relatively minor vertebral fracture and he has been more unsteady of gait recently and has been requiring assistance with ambulation recently.  Today the  father to his appointment at the [facility] and they stopped by a store briefly for the son run inside and get something.  When he came back out to the car the father was unresponsive and did not seem to be breathing.  The son got him out of the car and put him on tund and did a couple of chest compressions, at which point the patient woke up.  When EMS arrived the patient was breathing and responding but is extremely somnolent and has long periods of apnea if not stimulated.  If he is stimulated with physical or verbal interaction, the patient wakes up immediately but is able to provide no history andonly will state that he is not hurting and then he falls back asleep.   Past Medical History:  Diagnosis Date  . Arthritis   . BPH (benign prostatic hypertrophy)   . CAD (coronary artery disease)   . Chronic kidney disease, stage III (moderate)   . CVA (cerebral vascular accident) ([place])   . Degenerative disc disease   . Diabetes mellitus   . Gastroenteritis   . GERD (gastroesophageal  reflux disease)   . Hyperlipidemia   . Hypertension   . Polymyalgia rheumatica [facility])     Patient Active Problem List   Diagnosis Date Noted  . Unresponsive episode 06/16/2017  . Closed T12 fracture ([place]) 06/14/2017  . Edema 04/18/2017  . Chest pain 03/13/2017  . Aortic atherosclerosis ([place]) 02/17/2017  . Advance directive discussed with patient 12/26/2016  . COPD (chronic obstructive pulmonary disease) ([place]) 02/05/2016  . Thrombocytopenia ([place]) 02/05/2016  . Dysphagia 10/15/2015  . Mild malnutrition ([place]) 05/01/2015  . Nocturnal hypoxemia 06/12/2014  . Diabetes, polyneuropathy ([place]) 10/22/2013  . CKD stage 4 due to type 2 diabetes mellitus ([place]) 10/22/2013  . Type 2 diabetes mellitus with renal manifestations, controlled 10/22/2013  . Routine general medical examination at a health care facility 06/05/2013  . Chronic constipation 07/31/2012  . Polymyalgia rheumatica ([place]) 01/25/2010  . Essential hypertension, benign 08/28/2008  . [place] DISEASE, LUMBAR SPINE 08/28/2008  . OSTEOARTHRITIS 05/07/2007  . ATHEROSCLEROTIC CARDIOVASCULAR DISEASE 04/27/2007  . BENIGN PROSTATIC HYPERTROPHY 04/27/2007  . HYPERLIPIDEMIA 04/20/2007  . Atherosclerotic heart disease of native coronary artery with angina pectoris ([place]) 04/20/2007  . GERD 04/20/2007    Past Surgical History:  Procedure Laterality Date  . ANGIOPLASTY    . CATARACT EXTRACTION  1999 1992   glaucoma  . ESOPHAGEAL DILATION    . ESOPHAGOGASTRODUODENOSCOPY N/A 04/09/2015   Procedure: ESOPHAGOGASTRODUODENOSCOPY (EGD);  Surgeon: Josefine Class, MD;  Location: [facility] ENDOSCOPY;  Service: Endoscopy;  Laterality: N/A;  . SAVORY DILATION N/A 04/09/2015   Procedure: SAVORY DILATION;  Surgeon: Josefine Class, MD;  Location: [facility] ENDOSCOPY;  Service: Endoscopy;  Laterality: N/A;  . US ECHOCARDIOGRAPHY  04/2004   benign    Prior to Admission medications   Medication Sig Start Date End Date Taking? Authorizing Provider   aspirin EC 81 MG tablet Take 81 mg by mouth daily.   Yes [provider]  Blood Glucose Monitoring Suppl (ONE TOUCH ULTRA 2) w/Device KIT 1 Device by Does not apply route once. Diagnosis: Diabetes Type 2 DX Code: E11.40 01/14/16  Yes Viviana Simpler I, MD  brimonidine (ALPHAGAN) 0.2 % ophthalmic solution Place into both eyes 2 (two) times daily.   Yes [provider]  furosemide (LASIX) 20 MG tablet TAKE 1 TABLET BY MOUTH EVERY MORNING AS NEEDED FOR LEG SWELLING 05/11/17  Yes Viviana Simpler I, MD  glipiZIDE (GLUCOTROL) 5 MG tablet Take 1 tablet (5 mg total) by mouth daily. 01/31/17  Yes Viviana Simpler I, MD  glucose blood (ONE TOUCH TEST STRIPS) test strip Test blood sugar 3 times a day. 01/14/16  Yes Venia Carbon, MD  lisinopril (PRINIVIL,ZESTRIL) 10 MG tablet Take 10 mg by mouth daily.    Yes [provider]  [facility] DELICA LANCETS 17B MISC 1 strip by Does not apply route 3 (three) times daily with meals. Diagnosis: Diabetes Type 2 DX Code: E11.40 01/14/16  Yes Venia Carbon, MD  pravastatin (PRAVACHOL) 40 MG tablet Take 1 tablet (40 mg total) by mouth daily. 01/31/17  Yes Venia Carbon, MD  predniSONE (DELTASONE) 10 MG tablet Take 1 tablet (10 mg total) by mouth daily. 05/20/16  Yes Venia Carbon, MD  tamsulosin (FLOMAX) 0.4 MG CAPS capsule Take 1 capsule (0.4 mg total) by mouth daily. 01/31/17  Yes Venia Carbon, MD  timolol (TIMOPTIC) 0.5 % ophthalmic solution Place 1 drop into both eyes daily.   Yes [provider]  traMADol (ULTRAM) 50 MG tablet TAKE ONE (1) TABLET BY MOUTH THREE TIMES DAILY AS NEEDED FOR PAIN 05/11/17  Yes Venia Carbon, MD  calcitonin, salmon, (MIACALCIN/FORTICAL) 200 UNIT/ACT nasal spray Place 1 spray into alternate nostrils daily.    [provider]  fluticasone (FLONASE) 50 MCG/ACT nasal spray Place 2 sprays into the nose daily. 01/20/17   [provider]  hydrALAZINE (APRESOLINE) 50 MG tablet Take 50 mg  by mouth 4 (four) times daily as needed.    [provider]  polyethylene glycol (MIRALAX / GLYCOLAX) packet Take 17 g by mouth daily. Every other day    [provider]  verapamil (CALAN) 80 MG tablet Take 80 mg by mouth 2 (two) times daily.    [provider]    Allergies Penicillins  Family History  Problem Relation Age of Onset  . Constipation Mother   . Diabetes Maternal Uncle     Social History Social History  Substance Use Topics  . Smoking status: Former Research scientist (life sciences)  . Smokeless tobacco: Never Used     Comment: quit over 20 years ago  . Alcohol use No    Review of Systems Level 5 caveat:  history/ROS limited by acute/critical illness ____________________________________________   PHYSICAL EXAM:  VITAL SIGNS: ED Triage Vitals  Enc Vitals Group     BP 06/16/17 1123 (!) 154/91     Pulse Rate 06/16/17 1123 68     Resp 06/16/17 1123 (!) 22  Temp 06/16/17 1123 97.8 F (36.6 C)     Temp src --      SpO2 06/16/17 1123 (!) 81 %     Weight 06/16/17 1132 78.5 kg (173 lb)     Height 06/16/17 1132 1.88 m (_0 )     Head Circumference --      Peak Flow --      Pain Score --      Pain Loc --      Pain Edu? --      Excl. in [place]? --     Constitutional: the patient appears younger than her chronological age and is not in acute distress but is somnolent with long periods of apnea Eyes: Conjunctivae are normal. right pupil is constricted and left pupil is small but larger than right. Head: Atraumatic. Neck: No stridor.  No meningeal signs.  No cervical spine tenderness to palpation. Cardiovascular: Normal rate, regular rhythm. Good peripheral circulation. Grossly normal heart sounds. Respiratory: Normal respiratory effort when he breathes spontaneously but he has long periods of apnea in between normal breaths.  As soon as he is prompted to do so he will breathe normally and will follow instructions to return his nose Gastrointestinal: Soft and  nontender. No distention.  Musculoskeletal: No lower extremity tenderness nor edema. No gross deformities of extremities. Neurologic:  no gross neurolocal deficits ar appreciated but the patient cannot participate in a full neurological exam.  However he is moving all 4 extremities and has no obvious facial droop Skin:  Skin is warm, dry and intact. No rash noted.   ____________________________________________   LABS (all labs ordered are listed, but only abnormal results are displayed)  Labs Reviewed  COMPREHENSIVE METABOLIC PANEL - Abnormal; Notable for the following:       Result Value   Glucose, Bld 231 (*)    BUN 56 (*)    Creatinine, Ser 2.52 (*)    Calcium 8.3 (*)    Albumin 3.1 (*)    AST 57 (*)    Alkaline Phosphatase 151 (*)    GFR calc non Af Amer 20 (*)    GFR calc Af Amer 23 (*)    All other components within normal limits  BRAIN NATRIURETIC PEPTIDE - Abnormal; Notable for the following:    B Natriuretic Peptide 1,951.0 (*)    All other components within normal limits  ACETAMINOPHEN LEVEL - Abnormal; Notable for the following:    Acetaminophen (Tylenol), Serum <10 (*)    All other components within normal limits  TROPONIN I - Abnormal; Notable for the following:    Troponin I 0.10 (*)    All other components within normal limits  LACTIC ACID, PLASMA - Abnormal; Notable for the following:    Lactic Acid, Venous 2.5 (*)    All other components within normal limits  CBC WITH DIFFERENTIAL/PLATELET - Abnormal; Notable for the following:    RBC 3.31 (*)    Hemoglobin 9.6 (*)    HCT 30.1 (*)    RDW 15.3 (*)    Platelets 117 (*)    All other components within normal limits  URINALYSIS, ROUTINE W REFLEX MICROSCOPIC - Abnormal; Notable for the following:    Color, Urine YELLOW (*)    APPearance HAZY (*)    Glucose, UA 50 (*)    Protein, ur 100 (*)    Leukocytes, UA TRACE (*)    Squamous Epithelial / LPF 0-5 (*)    All other components within normal limits  T4,  FREE - Abnormal; Notable for the following:    Free T4 1.53 (*)    All other components within normal limits  ETHANOL  LIPASE, BLOOD  PROTIME-INR  URINE DRUG SCREEN, QUALITATIVE ([facility] ONLY)  TSH  LACTIC ACID, PLASMA  TROPONIN I  TROPONIN I  APTT   ____________________________________________  EKG  ED ECG REPORT I, Isahi Godwin, the attending physician, personally viewed and interpreted this ECG.  Date: 06/16/2017 EKG Time: 11:25 Rate: 66 Rhythm: normal sinus rhythm QRS Axis: normal Intervals: normal ST/T Wave abnormalities: inverted T wave in lead III and aVF, approx 1-mm ST depression in leads V2-V4 with inverted T wave in lead V3. Narrative Interpretation: Concerning for possible acute ischemia.  V2 is similar to last ECG from 3 months ago, but depression and T wave inversion in V3 is markedly different.  ____________________________________________  RADIOLOGY   Ct Head Wo Contrast  Result Date: 06/16/2017 CLINICAL DATA:  Cardiac arrest. EXAM: CT HEAD WITHOUT CONTRAST TECHNIQUE: Contiguous axial images were obtained from the base of the skull through the vertex without intravenous contrast. COMPARISON:  CT head dated May 08, 2006. FINDINGS: Brain: No evidence of acute infarction, hemorrhage, hydrocephalus, extra-axial collection or mass lesion/mass effect. Age-related cerebral atrophy with compensatory dilatation of the ventricles. Periventricular white matter and corona radiata hypodensities favor chronic ischemic microvascular white matter disease. Vascular: Intracranial atherosclerotic vascular calcifications. No hyperdense vessel. Skull: Normal. Negative for fracture or focal lesion. Sinuses/Orbits: The bilateral paranasal sinuses and mastoid air cells are clear. The orbits are unremarkable. Other: None. IMPRESSION: The 1.  No acute intracranial abnormality. Electronically Signed   By: Titus Dubin M.D.   On: 06/16/2017 12:38   Dg Chest Port 1 View  Result Date:  06/16/2017 CLINICAL DATA:  Altered mental status.  Periods of apnea. EXAM: PORTABLE CHEST 1 VIEW COMPARISON:  03/13/2017 FINDINGS: New mild cardiomegaly. Pulmonary vascularity is at the upper limits of normal. New small right pleural effusion. Chronic accentuation of the interstitial markings at the lung bases. Tortuosity and calcification of the thoracic aorta. IMPRESSION: 1. New mild cardiomegaly. 2. New small right pleural effusion. 3. Chronic interstitial lung disease at the lung bases. Electronically Signed   By: Lorriane Shire M.D.   On: 06/16/2017 11:51    ____________________________________________   PROCEDURES  Critical Care performed: Yes, see critical care procedure note(s)   Procedure(s) performed:   .Critical Care Performed by: Hinda Kehr Authorized by: Hinda Kehr   Critical care provider statement:    Critical care time (minutes):  60   Critical care time was exclusive of:  Separately billable procedures and treating other patients   Critical care was necessary to treat or prevent imminent or life-threatening deterioration of the following conditions:  Respiratory failure and CNS failure or compromise   Critical care was time spent personally by me on the following activities:  Development of treatment plan with patient or surrogate, discussions with consultants, evaluation of patient's response to treatment, examination of patient, obtaining history from patient or surrogate, ordering and performing treatments and interventions, ordering and review of laboratory studies, ordering and review of radiographic studies, pulse oximetry, re-evaluation of patient's condition and review of old charts      ____________________________________________   INITIAL IMPRESSION / [place] / ED COURSE  Pertinent labs & imaging results that were available during my care of the patient were reviewed by me and considered in my medical decision making (see chart for  details).     Clinical  Course as of Jun 16 1516  Fri Jun 16, 2017  1125 I saw the patient as he arrived with EMS.  He is somnolent and falls asleep immediately if he is not being spoken to or actively engaged.  However he does awaken immediately to hisbut he does not seem able toanswer questions beyond one or 2 words.  Once he is asleep he will breathe several times normally and then have a periods of apnea. EMS told me this is what he did for them.   His presentation is concerning given his episodes of apnea although he responds quickly to voice and he denies being in any pain.  Little other history is available at this time except that the patient has been living independently for about 10 years and his son is visiting from out of town and states that he has been"going downhill" over the last few weeks.  I will evaluate broadly as well as place him on the [place] with pacer/defibrillator pads.  Will also obtain STAT head CT to eval for CVA vs hemorrhage.  Patient reportedly takes Tramadol but no other narcotics; I will try giving a small dose of Narcan to see if his mental status improves.  [CF]  1138 Son is now in the room, confirmed that the patient is DNR/DNI (he is going out to his car to bring in the paperwork).  Patient is now on the monitor and dropped to the low 70s spO2 while apneic, but after I told him to take some deep breaths through his nose (and the 4L O2 by [place]), he went up to 99-100%.  CXR is being performed now, and then we will send him for head CT.  Son agrees with full investigation of the symptoms at this point, but also agrees with honoring the DNR/DNI for no chest compressions and no intubation.  [CF]  1301 No response to Narcan.  patient is more awake and alert now than he was upon arrival  is somnolent and falls asleep easily.  He has a troponin of 0.1 which is of unknown significance at this time given that he does have some EKG changes but it may also be related to demand  ischemia from, for example, an infectious process.  Urinalysis is pending.  UDS is negative.  Lactic is 2.5, which again could be due to an infectious process or could represent volume depletion/malnutrition .    [CF]  1312 The patient is noted to have a lactate>2. With the current information available to me, I don't think the patient is in septic shock. The lactate>2 is related to overall volume depletion and malnutrition.  Patient has no other evidence of infection at this time, although UA is pending.  BP is slightly elevated, afebrile, no tachycardia  [CF]  1314 I will give a small fluid bolus at reassess.  [CF]  1331 I secretary checked with the [facility] and they confirmed that they are definitely on diversion and cannot accept the patient as a transfer.  I will admit him to this hospital for further management of his multiple medical issues. his urinalysis is still pending and I will talk with the hospitalist after we know if he is suffering from a urinary tract infection.  [CF]  1402 Updated patient and family with plan for admission.  Discussed case in person with Dr. Fritzi Mandes who will admit or pass along the case to Dr. Leslye Peer.  [CF]    Clinical Course User Index [CF] Hinda Kehr, MD  ____________________________________________  FINAL CLINICAL IMPRESSION(S) / ED DIAGNOSES  Final diagnoses:  Elevated troponin I level  Syncope and collapse  Chronic kidney disease, unspecified CKD stage  Apnea for greater than 15 seconds  Elevated lactic acid level     MEDICATIONS GIVEN DURING THIS VISIT:  Medications  brimonidine (ALPHAGAN) 0.2 % ophthalmic solution 1 drop (not administered)  calcitonin ([place]) (MIACALCIN/FORTICAL) nasal spray 1 spray (not administered)  hydrALAZINE (APRESOLINE) tablet 50 mg (not administered)  lisinopril (PRINIVIL,ZESTRIL) tablet 10 mg (not administered)  verapamil (CALAN) tablet 80 mg (not administered)  furosemide (LASIX) tablet 20 mg (not administered)    traMADol (ULTRAM) tablet 50 mg (not administered)  fluticasone (FLONASE) 50 MCG/ACT nasal spray 2 spray (not administered)  glipiZIDE (GLUCOTROL) tablet 5 mg (not administered)  tamsulosin (FLOMAX) capsule 0.4 mg (not administered)  pravastatin (PRAVACHOL) tablet 40 mg (not administered)  predniSONE (DELTASONE) tablet 10 mg (not administered)  polyethylene glycol (MIRALAX / GLYCOLAX) packet 17 g (not administered)  aspirin EC tablet 81 mg (not administered)  timolol (TIMOPTIC) 0.5 % ophthalmic solution 1 drop (not administered)  0.9 %  sodium chloride infusion (not administered)  acetaminophen (TYLENOL) tablet 650 mg (not administered)    Or  acetaminophen (TYLENOL) suppository 650 mg (not administered)  heparin bolus via infusion 4,000 Units (not administered)    Followed by  heparin ADULT infusion 100 units/mL (25000 units/251m sodium chloride 0.45%) (not administered)  naloxone (NARCAN) injection 0.4 mg (0.4 mg Intravenous Given 06/16/17 1200)  sodium chloride 0.9 % bolus 500 mL (500 mLs Intravenous New Bag/Given 06/16/17 1350)     NEW OUTPATIENT MEDICATIONS STARTED DURING THIS VISIT:  New Prescriptions   No medications on file    Modified Medications   No medications on file    Discontinued Medications   GABAPENTIN (NEURONTIN) 100 MG CAPSULE    Take 1 capsule in the morning and 2 capsules at bedtime.   NITROGLYCERIN (NITROSTAT) 0.4 MG SL TABLET    Place 1 tablet (0.4 mg total) under the tongue every 5 (five) minutes as needed for chest pain.   OMEPRAZOLE (PRILOSEC) 40 MG CAPSULE    Take 1 capsule (40 mg total) by mouth daily.     Note:  This document was prepared using Dragon voice recognition software and may include unintentional dictation errors.    FHinda Kehr MD 06/16/17 [id number]

## 2017-06-17 ENCOUNTER — Inpatient Hospital Stay
Admit: 2017-06-17 | Discharge: 2017-06-17 | Disposition: A | Discharge status: Home / Self Care | Payer: Medicare Other | Attending: Internal Medicine | Admitting: Internal Medicine

## 2017-06-17 ENCOUNTER — Inpatient Hospital Stay: Payer: Medicare Other

## 2017-06-17 DIAGNOSIS — R55 Syncope and collapse: Secondary | ICD-10-CM | POA: Diagnosis present

## 2017-06-17 LAB — ECHOCARDIOGRAM COMPLETE
HEIGHTINCHES: 74 in
WEIGHTICAEL: 2768 [oz_av]

## 2017-06-17 LAB — CBC
HCT: 29.9 % — ABNORMAL LOW (ref 40.0–52.0)
Hemoglobin: 9.5 g/dL — ABNORMAL LOW (ref 13.0–18.0)
MCH: 28.2 pg (ref 26.0–34.0)
MCHC: 31.9 g/dL — AB (ref 32.0–36.0)
MCV: 88.3 fL (ref 80.0–100.0)
PLATELETS: 122 10*3/uL — AB (ref 150–440)
RBC: 3.38 MIL/uL — ABNORMAL LOW (ref 4.40–5.90)
RDW: 14.8 % — AB (ref 11.5–14.5)
WBC: 7.8 10*3/uL (ref 3.8–10.6)

## 2017-06-17 LAB — GLUCOSE, CAPILLARY
Glucose-Capillary: 129 mg/dL — ABNORMAL HIGH (ref 65–99)
Glucose-Capillary: 73 mg/dL (ref 65–99)

## 2017-06-17 LAB — BASIC METABOLIC PANEL
Anion gap: 7 (ref 5–15)
BUN: 55 mg/dL — AB (ref 6–20)
CALCIUM: 8.2 mg/dL — AB (ref 8.9–10.3)
CO2: 24 mmol/L (ref 22–32)
CREATININE: 2.4 mg/dL — AB (ref 0.61–1.24)
Chloride: 107 mmol/L (ref 101–111)
GFR calc Af Amer: 24 mL/min — ABNORMAL LOW (ref >60)
GFR calc non Af Amer: 21 mL/min — ABNORMAL LOW (ref >60)
Glucose, Bld: 160 mg/dL — ABNORMAL HIGH (ref 65–99)
Potassium: 4.5 mmol/L (ref 3.5–5.1)
Sodium: 138 mmol/L (ref 135–145)

## 2017-06-17 LAB — LIPID PANEL
CHOL/HDL RATIO: 2.3 ratio
CHOLESTEROL: 79 mg/dL (ref 0–200)
HDL: 35 mg/dL — AB (ref >40)
LDL Cholesterol: 36 mg/dL (ref 0–99)
Triglycerides: 42 mg/dL (ref <150)
VLDL: 8 mg/dL (ref 0–40)

## 2017-06-17 LAB — HEPARIN LEVEL (UNFRACTIONATED)
Heparin Unfractionated: 0.53 IU/mL (ref 0.30–0.70)
Heparin Unfractionated: 0.5 IU/mL (ref 0.30–0.70)

## 2017-06-17 LAB — LACTIC ACID, PLASMA: Lactic Acid, Venous: 1.2 mmol/L (ref 0.5–1.9)

## 2017-06-17 MED ORDER — INSULIN ASPART 100 UNIT/ML ~~LOC~~ SOLN
0.0000 [IU] | Freq: Three times a day (TID) | SUBCUTANEOUS | Status: DC
  Administered 2017-06-17: 1 [IU] via SUBCUTANEOUS
  Filled 2017-06-17: qty 1

## 2017-06-17 MED ORDER — INSULIN ASPART 100 UNIT/ML ~~LOC~~ SOLN: 0.0000 [IU] | Freq: Every day | SUBCUTANEOUS | Status: DC

## 2017-06-17 NOTE — Consult Note (Signed)
[name]  CARDIOLOGY CONSULT NOTE  Patient ID: [name] MRN: [id number] DOB/AGE: December 29, 1917 83 y.o.  Admit date: 06/16/2017 Referring Physician [name] Primary Physician   Primary Cardiologist [name] Reason for Consultation syncope  HPI: Patient is a 83 year old male who was admitted after a witnessed apparent syncopal episode. Patient lives by himself despite his advanced age. He recently had a thoracic compression fracture was recently discharged from rehabilitation. His son recently moved here from [name] to help take care of him. He was on his way to the [name] for appointments when his son noted that his father was having shallow respirations. He called 911 and was instructed to do chest compressions. EMS arrived and took over her chest compressions and the patient responded. Patient had episodes of not breathing in the past per his son's report. In the observation area of the ER he was noted to have apneic episodes. He has occasional shortness of breath occasional chest pain. Patient has a history of chronic renal insufficiency with a GFR of 24. He had a mild serum troponin elevation at 0.10 however has renal insufficiency and received CPR for 10-15 minutes. He is currently fully responsive. He has no chest pain other than some chest wall discomfort secondary to CPR. Urine drug screen is unremarkable. His electrocardiogram shows sinus rhythm with no ischemia. Chest x-ray showed mild cardiomegaly with a new small right pleural effusion chronic interstitial lung disease at the bases, brain CT scan showed no acute intracranial abnormalities. Ventilation perfusion lung scan revealed very low probability for pulmonary embolism. Lower extremity venous ultrasound is pending. Patient continues to have intermittent episodes of apnea while sleeping. Etiology of this is unclear. No obvious dysrhythmia has been noted on  telemetry. Echocardiogram done in May of this year revealed preserved LV function with no high-grade valvular disease. Echocardiogram today revealed a reduced LV function around 50%. There is no regional wall motion abnormalities.  Review of Systems  Constitutional: Negative.   HENT: Negative.   Eyes: Negative.   Respiratory: Negative.   Cardiovascular: Negative.   Gastrointestinal: Negative.   Genitourinary: Negative.   Musculoskeletal: Negative.   Skin: Negative.   Neurological: Positive for loss of consciousness.  Endo/Heme/Allergies: Negative.   Psychiatric/Behavioral: Negative.     Past Medical History:  Diagnosis Date  . Arthritis   . BPH (benign prostatic hypertrophy)   . CAD (coronary artery disease)   . Chronic kidney disease, stage III (moderate)   . CVA (cerebral vascular accident) ([name])   . Degenerative disc disease   . Diabetes mellitus   . Gastroenteritis   . GERD (gastroesophageal reflux disease)   . Hyperlipidemia   . Hypertension   . Polymyalgia rheumatica (HCC)     Family History  Problem Relation Age of Onset  . Constipation Mother   . Diabetes Maternal Uncle     Social History   Social History  . Marital status: Widowed    Spouse name: N/A  . Number of children: N/A  . Years of education: N/A   Occupational History  . retired    Social History Main Topics  . Smoking status: Former Research scientist (life sciences)  . Smokeless tobacco: Never Used     Comment: quit over 20 years ago  . Alcohol use No  . Drug use: No  . Sexual activity: Not on file   Other Topics Concern  . 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 ANGIOPLASTY    . CATARACT EXTRACTION  1999 1992    glaucoma  . ESOPHAGEAL DILATION    . ESOPHAGOGASTRODUODENOSCOPY N/A 04/09/2015   Procedure: ESOPHAGOGASTRODUODENOSCOPY (EGD);  Evan: Evan Maxwell, MD;  Location: [name];  Service: Endoscopy;  Laterality: N/A;  . SAVORY DILATION N/A 04/09/2015   Procedure: SAVORY DILATION;  Evan: [facility], MD;  Location: [name];  Service: Endoscopy;  Laterality: N/A;  . US ECHOCARDIOGRAPHY  04/2004   benign     Prescriptions Prior to Admission  Medication Sig Dispense Refill Last Dose  . aspirin EC 81 MG tablet Take 81 mg by mouth daily.   Past Week at Unknown time  . Blood Glucose Monitoring Suppl (ONE TOUCH ULTRA 2) w/Device KIT 1 Device by Does not apply route once. Diagnosis: Diabetes Type 2 DX Code: E11.40 1 each 0 Taking  . brimonidine (ALPHAGAN) 0.2 % ophthalmic solution Place into both eyes 2 (two) times daily.   Past Week at Unknown time  . furosemide (LASIX) 20 MG tablet TAKE 1 TABLET BY MOUTH EVERY MORNING AS NEEDED FOR LEG SWELLING 30 tablet 10 PRN at PRN  . glipiZIDE (GLUCOTROL) 5 MG tablet Take 1 tablet (5 mg total) by mouth daily. 90 tablet 3 06/16/2017 at 0700  . glucose blood (ONE TOUCH TEST STRIPS) test strip Test blood sugar 3 times a day. 100 each 5 Taking  . lisinopril (PRINIVIL,ZESTRIL) 10 MG tablet Take 10 mg by mouth daily.    06/16/2017 at 0800  . ONETOUCH DELICA LANCETS 33G MISC 1 strip by Does not apply route 3 (three) times daily with meals. Diagnosis: Diabetes Type 2 DX Code: E11.40 100 each 5 Taking  . pravastatin (PRAVACHOL) 40 MG tablet Take 1 tablet (40 mg total) by mouth daily. 90 tablet 3 06/15/2017 at 2000  . predniSONE (DELTASONE) 10 MG tablet Take 1 tablet (10 mg total) by mouth daily. 30 tablet 11 06/16/2017 at 0800  . tamsulosin (FLOMAX) 0.4 MG CAPS capsule Take 1 capsule (0.4 mg total) by mouth daily. 90 capsule 3 06/15/2017 at Unknown time  . timolol (TIMOPTIC) 0.5 % ophthalmic solution Place 1 drop into both eyes daily.   Past Week at  Unknown time  . traMADol (ULTRAM) 50 MG tablet TAKE ONE (1) TABLET BY MOUTH THREE TIMES DAILY AS NEEDED FOR PAIN 30 tablet 0 PRN at PRN  . calcitonin, salmon, (MIACALCIN/FORTICAL) 200 UNIT/ACT nasal spray Place 1 spray into alternate nostrils daily.   Not Taking at Unknown time  . fluticasone (FLONASE) 50 MCG/ACT nasal spray Place 2 sprays into the nose daily.   Not Taking at Unknown time  . hydrALAZINE (APRESOLINE) 50 MG tablet Take 50 mg by mouth 4 (four) times daily as needed.   Not Taking at Unknown time  . polyethylene glycol (MIRALAX / GLYCOLAX) packet Take 17 g by mouth daily. Every other day   PRN at PRN  . verapamil (CALAN) 80 MG tablet Take 80 mg by mouth 2 (two) times daily.   Not Taking at Unknown time    Physical Exam: Blood pressure (!) 175/88, pulse 75, temperature 98.1 F (36.7  C), temperature source Oral, resp. rate 19, height '6\' 2"'$  (1.88 m), weight 78.5 kg (173 lb), SpO2 93 %.   Wt Readings from Last 1 Encounters:  06/16/17 78.5 kg (173 lb)     General appearance: alert and cooperative Resp: clear to auscultation bilaterally Chest wall: right sided chest wall tenderness, left sided chest wall tenderness Cardio: regular rate and rhythm GI: soft, non-tender; bowel sounds normal; no masses,  no organomegaly Extremities: extremities normal, atraumatic, no cyanosis or edema Pulses: 2+ and symmetric Neurologic: Grossly normal  Labs:   Lab Results  Component Value Date   WBC 7.8 06/17/2017   HGB 9.5 (L) 06/17/2017   HCT 29.9 (L) 06/17/2017   MCV 88.3 06/17/2017   PLT 122 (L) 06/17/2017    Recent Labs Lab 06/16/17 1122 06/17/17 0054  NA 139 138  K 4.5 4.5  CL 105 107  CO2 25 24  BUN 56* 55*  CREATININE 2.52* 2.40*  CALCIUM 8.3* 8.2*  PROT 6.6  --   BILITOT 0.8  --   ALKPHOS 151*  --   ALT 31  --   AST 57*  --   GLUCOSE 231* 160*   Lab Results  Component Value Date   CKTOTAL 74 07/26/2012   CKMB 2.1 07/26/2012   TROPONINI 0.10 ([facility]) 06/16/2017        Radiology: No acute cardiopulmonary process EKG: Sinus rhythm with no ischemia  ASSESSMENT AND PLAN:  83 year old male with history of no significant cardiac abnormalities recently who was admitted after a witnessed probable syncopal episode. Patient recently had thoracic compression fractures treated. He was on his way to his doctor's office when he became unresponsive witnessed by his son. He received CPR from his side and EMS. On arrival emergency room he was in sinus rhythm. He is ruled out for myocardial infarction the LAD a mild troponin elevation of 0.1 appears to be secondary to CPR and demand ischemia. There is no evidence of ischemia clinically or on electrocardiogram. Telemetry has revealed no dysrhythmia. Echocardiogram reveals a slightly reduced LV function from echo done several months ago. There is no regional wall motion abnormality. Etiology of the event is unclear. Patient does have apneic episodes. We'll need to correlate these with heart rate. Consideration for nocturnal CPAP may be necessary if the patient has significant apneic episodes.. Would not proceed with any invasive evaluation of at all possible given his advanced age, renal insufficiency and reduced GFR. Should the patient be noted to have significant cardiac, permanent pacing could be considered. We'll follow for the next 24-48 hours to determine possible etiology of syncope. Signed: Teodoro Spray MD, [facility] 06/17/2017, 12:03 PM

## 2017-06-17 NOTE — Progress Notes (Signed)
[employer] [facility] [place] [facility]   PATIENT NAME: Evan [name]    MR#:  [id number]  DATE OF BIRTH:  [birth year]  SUBJECTIVE:  CHIEF COMPLAINT:   Chief Complaint  Patient presents with  . Loss of Consciousness   Generalized weakness. REVIEW OF SYSTEMS:  Review of Systems  Constitutional: Positive for malaise/fatigue. Negative for chills and fever.  HENT: Negative for sore throat.   Eyes: Negative for blurred vision and double vision.  Respiratory: Negative for cough, hemoptysis, shortness of breath, wheezing and stridor.   Cardiovascular: Negative for chest pain, palpitations, orthopnea and leg swelling.  Gastrointestinal: Negative for abdominal pain, blood in stool, diarrhea, melena, nausea and vomiting.  Genitourinary: Negative for dysuria, flank pain and hematuria.  Musculoskeletal: Negative for back pain and joint pain.  Neurological: Positive for weakness. Negative for dizziness, sensory change, focal weakness, seizures, loss of consciousness and headaches.  Endo/Heme/Allergies: Negative for polydipsia.  Psychiatric/Behavioral: Negative for depression. The patient is not nervous/anxious.     DRUG ALLERGIES:   Allergies  Allergen Reactions  . Penicillins Rash and Other (See Comments)    Has patient had a PCN reaction causing immediate rash, facial/tongue/throat swelling, SOB or lightheadedness with hypotension: No Has patient had a PCN reaction causing severe rash involving mucus membranes or skin necrosis: No Has patient had a PCN reaction that required hospitalization No Has patient had a PCN reaction occurring within the last 10 years: No If all of the above answers are "NO", then may proceed with Cephalosporin use.    VITALS:  Blood pressure (!) 169/89, pulse 77, temperature (!) 97.5 F (36.4 C), temperature source Oral, resp. rate 20, height 6\' 2"  (1.88 m), weight 173 lb (78.5 kg), SpO2 96 %. PHYSICAL EXAMINATION:  Physical Exam    Constitutional: He is oriented to person, place, and time and well-developed, well-nourished, and in no distress.  HENT:  Head: Normocephalic.  Mouth/Throat: Oropharynx is clear and moist.  Eyes: Pupils are equal, round, and reactive to light. Conjunctivae and EOM are normal. No scleral icterus.  Neck: Normal range of motion. Neck supple. No JVD present. No tracheal deviation present.  Cardiovascular: Normal rate, regular rhythm and normal heart sounds.  Exam reveals no gallop.   No murmur heard. Pulmonary/Chest: Effort normal and breath sounds normal. No respiratory distress. He has no wheezes. He has no rales.  Abdominal: Soft. Bowel sounds are normal. He exhibits no distension. There is no tenderness. There is no rebound.  Musculoskeletal: Normal range of motion. He exhibits no edema or tenderness.  Neurological: He is alert and oriented to person, place, and time. No cranial nerve deficit.  Skin: No rash noted. No erythema.  Psychiatric: Affect normal.   LABORATORY PANEL:  Male CBC  Recent Labs Lab 06/17/17 0054  WBC 7.8  HGB 9.5*  HCT 29.9*  PLT 122*   ------------------------------------------------------------------------------------------------------------------ Chemistries   Recent Labs Lab 06/16/17 1122 06/17/17 0054  NA 139 138  K 4.5 4.5  CL 105 107  CO2 25 24  GLUCOSE 231* 160*  BUN 56* 55*  CREATININE 2.52* 2.40*  CALCIUM 8.3* 8.2*  AST 57*  --   ALT 31  --   ALKPHOS 151*  --   BILITOT 0.8  --    RADIOLOGY:  Nm Pulmonary Perf And Vent  Result Date: 06/16/2017 CLINICAL DATA:  Apnea, found unconscious, underwent CPR, history coronary artery disease, GERD, diabetes mellitus, hypertension, prior stroke, stage III chronic kidney disease EXAM: NUCLEAR MEDICINE VENTILATION -  PERFUSION LUNG SCAN TECHNIQUE: Ventilation images were obtained in multiple projections using inhaled aerosol Tc-53m DTPA. Perfusion images were obtained in multiple projections after  intravenous injection of Tc-55m MAA. RADIOPHARMACEUTICALS:  32.12 mCi Technetium-62m DTPA aerosol inhalation and 4.35 mCi Technetium-61m MAA IV COMPARISON:  None Radiographic correlation:  06/16/2017 FINDINGS: Ventilation: Central airway deposition of aerosol. Diminished ventilation at the apices and RIGHT lower lobe. No additional ventilatory defects. Perfusion: Nonsegmental minimally diminished perfusion at the lung apices and RIGHT lower lobe matching ventilation pattern. No segmental or subsegmental perfusion defects identified. IMPRESSION: Very low probability for pulmonary embolism. Electronically Signed   By: Ulyses Southward M.D.   On: 06/16/2017 18:59   US Venous Img Lower Bilateral  Result Date: 06/17/2017 CLINICAL DATA:  BILATERAL lower extremity swelling EXAM: BILATERAL LOWER EXTREMITY VENOUS DOPPLER ULTRASOUND TECHNIQUE: Gray-scale sonography with graded compression, as well as color Doppler and duplex ultrasound were performed to evaluate the lower extremity deep venous systems from the level of the common femoral vein and including the common femoral, femoral, profunda femoral, popliteal and calf veins including the posterior tibial, peroneal and gastrocnemius veins when visible. The superficial great saphenous vein was also interrogated. Spectral Doppler was utilized to evaluate flow at rest and with distal augmentation maneuvers in the common femoral, femoral and popliteal veins. COMPARISON:  LEFT lower extremity venous ultrasound 09/30/2014 FINDINGS: RIGHT LOWER EXTREMITY Common Femoral Vein: No evidence of thrombus. Normal compressibility, respiratory phasicity and response to augmentation. Saphenofemoral Junction: Greater saphenous vein not visualized. No evidence of thrombus. Normal compressibility and flow on color Doppler imaging. Profunda Femoral Vein: No evidence of thrombus. Normal compressibility and flow on color Doppler imaging. Femoral Vein: No evidence of thrombus. Normal  compressibility, respiratory phasicity and response to augmentation. Popliteal Vein: No evidence of thrombus. Normal compressibility, respiratory phasicity and response to augmentation. Calf Veins: No evidence of thrombus. Normal compressibility and flow on color Doppler imaging. Superficial Great Saphenous Vein: Not visualized. Prior heart surgery question resected. Venous Reflux:  None. Other Findings:  None. LEFT LOWER EXTREMITY Common Femoral Vein: No evidence of thrombus. Normal compressibility, respiratory phasicity and response to augmentation. Saphenofemoral Junction: Greater saphenous vein not visualized. No evidence of thrombus. Normal compressibility and flow on color Doppler imaging. Profunda Femoral Vein: No evidence of thrombus. Normal compressibility and flow on color Doppler imaging. Femoral Vein: No evidence of thrombus. Normal compressibility, respiratory phasicity and response to augmentation. Popliteal Vein: No evidence of thrombus. Normal compressibility, respiratory phasicity and response to augmentation. Calf Veins: No evidence of thrombus. Normal compressibility and flow on color Doppler imaging. Superficial Great Saphenous Vein: Not visualized. Prior heart surgery question resected. Venous Reflux:  None. Other Findings:  None. IMPRESSION: No evidence of DVT within either lower extremity. Electronically Signed   By: Ulyses Southward M.D.   On: 06/17/2017 09:23   ASSESSMENT AND PLAN:   1. Unresponsive episode in which CPR was done in the field. Unclear etiology. Improved. Elevated troponin due to CPR and demand ischemia. He is ruled out for myocardial infarction. Echocardiogram reveals a slightly reduced LV function. VQ scan is unremarkable and ultrasound the lower extremities that showed DVT. Per Dr. Lady Gary, Would not proceed with any invasive evaluation of at all possible given his advanced age, renal insufficiency and reduced GFR. Should the patient be noted to have significant cardiac,  permanent pacing could be considered. We'll follow for the next 24-48 hours to determine possible etiology of syncope.  Continue aspirin. Discontinue heparin drip.  2. Apneic episodes. Overnight  oximetry to see if he qualifies for nighttime oxygen.  3. Type 2 diabetes mellitus. Continue sliding scale and discontinue glipizide. 4. Acute on Chronic kidney disease stage III with creatinine slightly higher. Gentle IV fluid. Follow up BMP. 5. History of CVA with right-sided weakness 6. Essential hypertension on verapamil and lisinopril 7. Thrombocytopenia chronic in nature 8. Glaucoma continue eyedrops Generalized weakness. PT evaluation. Discussed with Dr. Lady GaryFath. All the records are reviewed and case discussed with Care Management/Social Worker. Management plans discussed with the patient, family and they are in agreement.  CODE STATUS: Full Code  TOTAL TIME TAKING CARE OF THIS PATIENT: 33 minutes.   More than 50% of the time was spent in counseling/coordination of care: YES  POSSIBLE D/C IN 1-2 DAYS, DEPENDING ON CLINICAL CONDITION.   [name], Yarden Hillis M.D on 06/17/2017 at 3:09 PM  Between 7am to 6pm - Pager - [phone]  After 6pm go to [email or web address] - Social research officer, [email or web address] [email or web address]  [facility] [place] [facility]  Office  [phone]  CC: Primary care physician; Karie SchwalbeLetvak, Richard I, MD  Note: This dictation was prepared with Dragon dictation along with smaller phrase technology. Any transcriptional errors that result from this process are unintentional.

## 2017-06-17 NOTE — Progress Notes (Signed)
ANTICOAGULATION CONSULT NOTE - Initial Consult  Pharmacy Consult for Heparin Drip  Indication: chest pain/ACS  Allergies  Allergen Reactions  . Penicillins Rash and Other (See Comments)    Has patient had a PCN reaction causing immediate rash, facial/tongue/throat swelling, SOB or lightheadedness with hypotension: No Has patient had a PCN reaction causing severe rash involving mucus membranes or skin necrosis: No Has patient had a PCN reaction that required hospitalization No Has patient had a PCN reaction occurring within the last 10 years: No If all of the above answers are "NO", then may proceed with Cephalosporin use.     Patient Measurements: Height: 6\' 2"  (188 cm) Weight: 173 lb (78.5 kg) IBW/kg (Calculated) : 82.2  Vital Signs: Temp: 98.3 F (36.8 C) (08/10 2029) Temp Source: Axillary (08/10 1714) BP: 137/70 (08/10 2029) Pulse Rate: 63 (08/10 2029)  Labs:  Recent Labs  06/14/17 0938 06/16/17 1122 06/16/17 1717 06/16/17 2049 06/17/17 0054  HGB  --  9.6*  --   --  9.5*  HCT  --  30.1*  --   --  29.9*  PLT  --  117*  --   --  122*  APTT  --   --  >160*  --   --   LABPROT  --  14.8  --   --   --   INR  --  1.15  --   --   --   HEPARINUNFRC  --   --   --   --  0.53  CREATININE 2.26* 2.52*  --   --  2.40*  TROPONINI  --  0.10* 0.10* 0.10*  --     Estimated Creatinine Clearance: 19.5 mL/min (A) (by C-G formula based on SCr of 2.4 mg/dL (H)).   Medical History: Past Medical History:  Diagnosis Date  . Arthritis   . BPH (benign prostatic hypertrophy)   . CAD (coronary artery disease)   . Chronic kidney disease, stage III (moderate)   . CVA (cerebral vascular accident) (HCC)   . Degenerative disc disease   . Diabetes mellitus   . Gastroenteritis   . GERD (gastroesophageal reflux disease)   . Hyperlipidemia   . Hypertension   . Polymyalgia rheumatica (HCC)    Assessment: Pharmacy consulted for heparin dosing in a 83 yo male who was found unresponsive in  field. Patient did received CPR and now has borderline troponin.   Goal of Therapy:  Heparin level 0.3-0.7 units/ml Monitor platelets by anticoagulation protocol: Yes   Plan:  Baseline labs ordered Give 4000 units bolus x 1 Start heparin infusion at 900 units/hr Check anti-Xa level in 8 hours and daily while on heparin Continue to monitor H&H and platelets  8/11 01:00 heparin level 0.53. Continue current regimen. Recheck in 8 hours to confirm.  Erich MontaneMcBane,Lavra Imler S, PharmD, BCPS Clinical Pharmacist 06/17/2017 2:44 AM

## 2017-06-17 NOTE — Progress Notes (Signed)
ANTICOAGULATION CONSULT NOTE - Follow Up Consult  Pharmacy Consult for Heparin Drip  Indication: chest pain/ACS  Allergies  Allergen Reactions  . Penicillins Rash and Other (See Comments)    Has patient had a PCN reaction causing immediate rash, facial/tongue/throat swelling, SOB or lightheadedness with hypotension: No Has patient had a PCN reaction causing severe rash involving mucus membranes or skin necrosis: No Has patient had a PCN reaction that required hospitalization No Has patient had a PCN reaction occurring within the last 10 years: No If all of the above answers are "NO", then may proceed with Cephalosporin use.     Patient Measurements: Height: 6\' 2"  (188 cm) Weight: 173 lb (78.5 kg) IBW/kg (Calculated) : 82.2  Vital Signs: Temp: 98.1 F (36.7 C) (08/11 1029) Temp Source: Oral (08/11 1029) BP: 175/88 (08/11 1029) Pulse Rate: 75 (08/11 1029)  Labs:  Recent Labs  06/16/17 1122 06/16/17 1717 06/16/17 2049 06/17/17 0054 06/17/17 1025  HGB 9.6*  --   --  9.5*  --   HCT 30.1*  --   --  29.9*  --   PLT 117*  --   --  122*  --   APTT  --  >160*  --   --   --   LABPROT 14.8  --   --   --   --   INR 1.15  --   --   --   --   HEPARINUNFRC  --   --   --  0.53 0.50  CREATININE 2.52*  --   --  2.40*  --   TROPONINI 0.10* 0.10* 0.10*  --   --     Estimated Creatinine Clearance: 19.5 mL/min (A) (by C-G formula based on SCr of 2.4 mg/dL (H)).   Medical History: Past Medical History:  Diagnosis Date  . Arthritis   . BPH (benign prostatic hypertrophy)   . CAD (coronary artery disease)   . Chronic kidney disease, stage III (moderate)   . CVA (cerebral vascular accident) (HCC)   . Degenerative disc disease   . Diabetes mellitus   . Gastroenteritis   . GERD (gastroesophageal reflux disease)   . Hyperlipidemia   . Hypertension   . Polymyalgia rheumatica (HCC)    Assessment: Pharmacy consulted for heparin dosing in a 83 yo male who was found unresponsive in  field. Patient did received CPR and now has borderline troponin.   Goal of Therapy:  Heparin level 0.3-0.7 units/ml Monitor platelets by anticoagulation protocol: Yes   Plan:  Baseline labs ordered Give 4000 units bolus x 1 Start heparin infusion at 900 units/hr Check anti-Xa level in 8 hours and daily while on heparin Continue to monitor H&H and platelets  8/11 01:00 heparin level 0.53. Continue current regimen. Recheck in 8 hours to confirm.  8/11 10:25 HL = 0.5.  Will continue current rate.  Will recheck HL with AM labs.    Stormy CardKatsoudas,Truc Winfree K, Crozer-Chester Medical CenterRPH Clinical Pharmacist 06/17/2017 12:33 PM

## 2017-06-17 NOTE — Progress Notes (Signed)
Patient off unit for cardiac testing. Will resume care upon return. Notnamed Scholz M Lanyah Spengler  

## 2017-06-17 NOTE — Care Management Obs Status (Signed)
MEDICARE OBSERVATION STATUS NOTIFICATION   Patient Details  Name: [name] MRN: [id number] Date of Birth: 10/20/1918   Medicare Observation Status Notification Given:  Yes    Corrin Sieling A, RN 06/17/2017, 5:07 PM

## 2017-06-17 NOTE — Care Management CC44 (Signed)
Condition Code 44 Documentation Completed  Patient Details  Name: Prudencio BurlySeawell C Gergen MRN: [id number] Date of Birth: 01/17/1918   Condition Code 44 given:  Yes Patient signature on Condition Code 44 notice:  Yes Documentation of 2 MD's agreement:  Yes Code 44 added to claim:  Yes    Kendle Erker A, RN 06/17/2017, 5:07 PM

## 2017-06-17 NOTE — Progress Notes (Signed)
*  PRELIMINARY RESULTS* Echocardiogram 2D Echocardiogram has been performed.  Evan [name] 06/17/2017, 8:41 AM

## 2017-06-18 LAB — BASIC METABOLIC PANEL
Anion gap: 7 (ref 5–15)
BUN: 56 mg/dL — AB (ref 6–20)
CHLORIDE: 107 mmol/L (ref 101–111)
CO2: 25 mmol/L (ref 22–32)
CREATININE: 2.28 mg/dL — AB (ref 0.61–1.24)
Calcium: 8.1 mg/dL — ABNORMAL LOW (ref 8.9–10.3)
GFR calc Af Amer: 26 mL/min — ABNORMAL LOW (ref >60)
GFR, EST NON AFRICAN AMERICAN: 22 mL/min — AB (ref >60)
GLUCOSE: 67 mg/dL (ref 65–99)
POTASSIUM: 4.1 mmol/L (ref 3.5–5.1)
Sodium: 139 mmol/L (ref 135–145)

## 2017-06-18 LAB — GLUCOSE, CAPILLARY: GLUCOSE-CAPILLARY: 99 mg/dL (ref 65–99)

## 2017-06-18 NOTE — Care Management Note (Signed)
Case Management Note  Patient Details  Name: Prudencio BurlySeawell C Katzman MRN: [id number] Date of Birth: 11/01/1918  Subjective/Objective:     Discharge planning discussed with son. Son thinks Mr Andrey CampanileWilson may have been referred to [facility] when he was discharged from [facility]. [facility] for [facility]-PT.                Action/Plan:   Expected Discharge Date:  06/18/17               Expected Discharge Plan:  Home w Home Health Services  In-House Referral:  NA  Discharge planning Services  NA  Post Acute Care Choice:  Home Health Choice offered to:  Adult Children, Patient  DME Arranged:  N/A DME Agency:  NA  HH Arranged:  PT HH Agency:  [facility]  Status of Service:  Completed, signed off  If discussed at Long Length of Stay Meetings, dates discussed:    Additional Comments:  Dayelin Balducci A, RN 06/18/2017, 10:55 AM

## 2017-06-18 NOTE — Progress Notes (Signed)
IV and tele removed. Son reviewed discharge intructions. Hom health was set up. Discharge instructions reviewed with son. Pt has no further concerns at this time.

## 2017-06-18 NOTE — Discharge Instructions (Signed)
-   Fall precaution 

## 2017-06-18 NOTE — Evaluation (Signed)
Physical Therapy Evaluation Patient Details Name: Evan [name] MRN: [id number] DOB: 08/18/1918 Today's Date: 06/18/2017   History of Present Illness  83 yo Male came to ED after he was found unresponsive by son. Patient did receive CPR while on way to hospital. He was diagnosed with syncope episode. PMH significant: Acute on chronic kidney disease, CVA with right side weakness; recent thoracic compression fracture;   Clinical Impression  83 yo Male s/p syncope episode reports doing well. He has a PMH significant for recent thoracic compression fracture and was recently getting rehab at [facility]. Patient was recently discharged home. His son came down from [place] and will be staying with him for a few weeks until he gets back on his feet. Patient lives in a single story home. He does have home health aide that comes 2 hours a day to assist with bathing/dressing and meal prep. Patient exhibits some weakness in BLE (grossly 4/5). He is supervision bed mobility requiring increased time and elevated head of bed due to slight discomfort in back while pushing up into sitting. Patient able to transfer sit<>Stand from elevated surfaces with CGA however requires min A when getting up from low surfaces with RW. Patient ambulated 160 feet with RW, CGA for safety with wide base of support and slower gait speed. He would benefit from skilled PT intervention to improve gait safety, standing balance and LE strength. Recommend home health PT upon discharge for continued mobility needs.     Follow Up Recommendations Home health PT;Supervision for mobility/OOB    Equipment Recommendations  None recommended by PT    Recommendations for Other Services Rehab consult     Precautions / Restrictions Precautions Precautions: Fall Restrictions Weight Bearing Restrictions: No      Mobility  Bed Mobility Overal bed mobility: Needs Assistance Bed Mobility: Supine to Sit     Supine to sit: Supervision      General bed mobility comments: with elevated head of bed and bed rails; requires increased time with difficulty pushing up through LE due to discomfort in back;   Transfers Overall transfer level: Needs assistance Equipment used: Rolling walker (2 wheeled) Transfers: Sit to/from Stand Sit to Stand: Min assist         General transfer comment: requires min A for sit<>Stand transfer from low seat recliner; able to transfer from elevated bed with CGA. Patient does require cues to increase forward weight shift and to push through BLE for better transfer ability;   Ambulation/Gait Ambulation/Gait assistance: Min guard Ambulation Distance (Feet): 160 Feet Assistive device: Rolling walker (2 wheeled) Gait Pattern/deviations: Step-through pattern;Decreased step length - right;Decreased step length - left;Decreased dorsiflexion - right;Decreased dorsiflexion - left;Wide base of support;Trunk flexed Gait velocity: decreased   General Gait Details: exhibits slower gait speed with decreased step length, but step through pattern; Patient does have slight flexed trunk requiring cues to stand erect; Patient requires CGA for safety;   Stairs            Wheelchair Mobility    Modified Rankin (Stroke Patients Only)       Balance Overall balance assessment: Needs assistance Sitting-balance support: No upper extremity supported;Feet supported Sitting balance-Leahy Scale: Good     Standing balance support: Bilateral upper extremity supported Standing balance-Leahy Scale: Fair Standing balance comment: does require CGA for safety especially with dynamic balance tasks such as turning or reaching outside base of support;  Pertinent Vitals/Pain Pain Assessment: No/denies pain    Home Living Family/patient expects to be discharged to:: Private residence Living Arrangements: Alone Available Help at Discharge: Family;Personal care attendant (has home  aide 2hours a day; son will be staying with him for a few weeks after he goes home. ) Type of Home: House Home Access: Ramped entrance     Home Layout: One level Home Equipment: Walker - 4 wheels;Walker - 2 wheels;Bedside commode;Cane - single point;Shower seat;Grab bars - toilet      Prior Function Level of Independence: Needs assistance   Gait / Transfers Assistance Needed: was walking with RW, mod I  ADL's / Homemaking Assistance Needed: needed help; had home health aide 2 hours a day to assist with bathing and some dressing; home care also made meals and helped with cleaning;   Comments: Patient was at [facility] following recent thoracic fracture; he had been discharged home; His son came down from [place] to stay with him for a few weeks while he got back on his feet;      Hand Dominance        Extremity/Trunk Assessment   Upper Extremity Assessment Upper Extremity Assessment: Overall WFL for tasks assessed    Lower Extremity Assessment Lower Extremity Assessment: Generalized weakness (grossly  4/5 bilaterally; ROM is WFL; intact light touch sensation bilaterally; )    Cervical / Trunk Assessment Cervical / Trunk Assessment: Normal  Communication   Communication: HOH  Cognition Arousal/Alertness: Awake/alert Behavior During Therapy: WFL for tasks assessed/performed Overall Cognitive Status: Within Functional Limits for tasks assessed                                        General Comments General comments (skin integrity, edema, etc.): grossly intact; no bruises or wounds noted; no swelling;     Exercises     Assessment/Plan    PT Assessment Patient needs continued PT services  PT Problem List Decreased strength;Decreased activity tolerance;Decreased balance;Decreased mobility;Decreased safety awareness       PT Treatment Interventions Gait training;Functional mobility training;Balance training;Therapeutic exercise;Therapeutic  activities;Patient/family education    PT Goals (Current goals can be found in the Care Plan section)  Acute Rehab PT Goals Patient Stated Goal: "I want to go home" PT Goal Formulation: With patient Time For Goal Achievement: 07/02/17 Potential to Achieve Goals: Good    Frequency Min 2X/week   Barriers to discharge Decreased caregiver support son will be staying with him for a few weeks; pt does otherwise live alone;     Co-evaluation               AM-PAC PT "6 Clicks" Daily Activity  Outcome Measure Difficulty turning over in bed (including adjusting bedclothes, sheets and blankets)?: A Little Difficulty moving from lying on back to sitting on the side of the bed? : A Lot Difficulty sitting down on and standing up from a chair with arms (e.g., wheelchair, bedside commode, etc,.)?: A Little Help needed moving to and from a bed to chair (including a wheelchair)?: A Little Help needed walking in hospital room?: A Little Help needed climbing 3-5 steps with a railing? : A Little 6 Click Score: 17    End of Session Equipment Utilized During Treatment: Gait belt Activity Tolerance: Patient tolerated treatment well;No increased pain Patient left: in chair;with call bell/phone within reach;with chair alarm set Nurse Communication: Mobility status PT Visit  Diagnosis: Unsteadiness on feet (R26.81)    Time: 1610-9604 PT Time Calculation (min) (ACUTE ONLY): 34 min   Charges:   PT Evaluation $PT Eval Low Complexity: 1 Low     PT G Codes:   PT G-Codes **NOT FOR INPATIENT CLASS** Functional Assessment Tool Used: AM-PAC 6 Clicks Basic Mobility;Clinical judgement Functional Limitation: Mobility: Walking and moving around Mobility: Walking and Moving Around Current Status (V4098): At least 40 percent but less than 60 percent impaired, limited or restricted Mobility: Walking and Moving Around Goal Status [phone]): At least 40 percent but less than 60 percent impaired, limited or  restricted      Evan [name] PT, DPT 06/18/2017, 10:36 AM

## 2017-06-18 NOTE — Discharge Summary (Signed)
[facility] [place] NAME: Evan [name]    MR#:  [id number]  [place]:  1918-10-05  DATE OF ADMISSION:  06/16/2017   ADMITTING PHYSICIAN: Loletha Grayer, MD  DATE OF DISCHARGE: 06/18/2017 11:05 AM  PRIMARY CARE PHYSICIAN: Venia Carbon, MD   ADMISSION DIAGNOSIS:  Syncope and collapse [R55] Swelling [R60.9] Elevated troponin I level [R74.8] Elevated lactic acid level [R79.89] Apnea for greater than 15 seconds [R06.81] Chronic kidney disease, unspecified CKD stage [N18.9] DISCHARGE DIAGNOSIS:  Active Problems:   Unresponsive episode   Syncope  SECONDARY DIAGNOSIS:   Past Medical History:  Diagnosis Date  . Arthritis   . BPH (benign prostatic hypertrophy)   . CAD (coronary artery disease)   . Chronic kidney disease, stage III (moderate)   . CVA (cerebral vascular accident) ([place])   . Degenerative disc disease   . Diabetes mellitus   . Gastroenteritis   . GERD (gastroesophageal reflux disease)   . Hyperlipidemia   . Hypertension   . Polymyalgia rheumatica ([place])    HOSPITAL COURSE:   1. Unresponsive episode in which CPR was done in the field. Unclear etiology. Improved. Elevated troponin due to CPR and demand ischemia. He is ruled out for myocardial infarction. Echocardiogram reveals a slightly reduced LV function. LV EF: 45% -   50%. VQ scan is unremarkable and ultrasound the lower extremities that showed DVT. Per Dr. Ubaldo Glassing, Would not proceed with any invasive evaluation of at all possible given his advanced age, renal insufficiency and reduced GFR. Should the patient be noted to have significant cardiac, permanent pacing could be considered. Follow-up with Dr. Clayborn Bigness as outpatient. Continue aspirin. Discontinued heparin drip.  2. Apneic episodes. Overnight oximetry to see if he qualifies for nighttime oxygen.  3. Type 2 diabetes mellitus. Continue sliding scale and discontinued glipizide. 4. Acute on Chronic  kidney disease stage III with creatinine slightly higher. Improved to baseline with Gentle IV fluid. Follow up BMP as outpatient. 5. History of CVA with right-sided weakness 6. Essential hypertension on verapamil and lisinopril 7. Thrombocytopenia chronic in nature 8. Glaucoma continue eyedrops Generalized weakness. PT evaluation: HHPT. Discussed with Dr. Ubaldo Glassing.  DISCHARGE CONDITIONS:  Stable, discharged to home with home health and PT today. CONSULTS OBTAINED:  Treatment Team:  Teodoro Spray, MD DRUG ALLERGIES:   Allergies  Allergen Reactions  . Penicillins Rash and Other (See Comments)    Has patient had a PCN reaction causing immediate rash, facial/tongue/throat swelling, SOB or lightheadedness with hypotension: No Has patient had a PCN reaction causing severe rash involving mucus membranes or skin necrosis: No Has patient had a PCN reaction that required hospitalization No Has patient had a PCN reaction occurring within the last 10 years: No If all of the above answers are "NO", then may proceed with Cephalosporin use.    DISCHARGE MEDICATIONS:   Allergies as of 06/18/2017      Reactions   Penicillins Rash, Other (See Comments)   Has patient had a PCN reaction causing immediate rash, facial/tongue/throat swelling, SOB or lightheadedness with hypotension: No Has patient had a PCN reaction causing severe rash involving mucus membranes or skin necrosis: No Has patient had a PCN reaction that required hospitalization No Has patient had a PCN reaction occurring within the last 10 years: No If all of the above answers are "NO", then may proceed with Cephalosporin use.      Medication List    TAKE these medications  aspirin EC 81 MG tablet Take 81 mg by mouth daily.   brimonidine 0.2 % ophthalmic solution Commonly known as:  ALPHAGAN Place into both eyes 2 (two) times daily.   calcitonin (salmon) 200 UNIT/ACT nasal spray Commonly known as:  MIACALCIN/FORTICAL Place 1  spray into alternate nostrils daily.   fluticasone 50 MCG/ACT nasal spray Commonly known as:  FLONASE Place 2 sprays into the nose daily.   furosemide 20 MG tablet Commonly known as:  LASIX TAKE 1 TABLET BY MOUTH EVERY MORNING AS NEEDED FOR LEG SWELLING   glipiZIDE 5 MG tablet Commonly known as:  GLUCOTROL Take 1 tablet (5 mg total) by mouth daily.   glucose blood test strip Commonly known as:  ONE TOUCH TEST STRIPS Test blood sugar 3 times a day.   hydrALAZINE 50 MG tablet Commonly known as:  APRESOLINE Take 50 mg by mouth 4 (four) times daily as needed.   lisinopril 10 MG tablet Commonly known as:  PRINIVIL,ZESTRIL Take 10 mg by mouth daily.   ONE TOUCH ULTRA 2 w/Device Kit 1 Device by Does not apply route once. Diagnosis: Diabetes Type 2 DX Code: E08.14   ONETOUCH DELICA LANCETS 48J Misc 1 strip by Does not apply route 3 (three) times daily with meals. Diagnosis: Diabetes Type 2 DX Code: E11.40   polyethylene glycol packet Commonly known as:  MIRALAX / GLYCOLAX Take 17 g by mouth daily. Every other day   pravastatin 40 MG tablet Commonly known as:  PRAVACHOL Take 1 tablet (40 mg total) by mouth daily.   predniSONE 10 MG tablet Commonly known as:  DELTASONE Take 1 tablet (10 mg total) by mouth daily.   tamsulosin 0.4 MG Caps capsule Commonly known as:  FLOMAX Take 1 capsule (0.4 mg total) by mouth daily.   timolol 0.5 % ophthalmic solution Commonly known as:  TIMOPTIC Place 1 drop into both eyes daily.   traMADol 50 MG tablet Commonly known as:  ULTRAM TAKE ONE (1) TABLET BY MOUTH THREE TIMES DAILY AS NEEDED FOR PAIN   verapamil 80 MG tablet Commonly known as:  CALAN Take 80 mg by mouth 2 (two) times daily.        DISCHARGE INSTRUCTIONS:  See AVS. If you experience worsening of your admission symptoms, develop shortness of breath, life threatening emergency, suicidal or homicidal thoughts you must seek medical attention immediately by calling 911 or  calling your MD immediately  if symptoms less severe.  You Must read complete instructions/literature along with all the possible adverse reactions/side effects for all the Medicines you take and that have been prescribed to you. Take any new Medicines after you have completely understood and accpet all the possible adverse reactions/side effects.   Please note  You were cared for by a hospitalist during your hospital stay. If you have any questions about your discharge medications or the care you received while you were in the hospital after you are discharged, you can call the unit and asked to speak with the hospitalist on call if the hospitalist that took care of you is not available. Once you are discharged, your primary care physician will handle any further medical issues. Please note that NO REFILLS for any discharge medications will be authorized once you are discharged, as it is imperative that you return to your primary care physician (or establish a relationship with a primary care physician if you do not have one) for your aftercare needs so that they can reassess your need for medications and  monitor your lab values.    On the day of Discharge:  VITAL SIGNS:  Blood pressure (!) 142/62, pulse 69, temperature 97.7 F (36.5 C), temperature source Oral, resp. rate 16, height 6' 2" (1.88 m), weight 173 lb (78.5 kg), SpO2 91 %. PHYSICAL EXAMINATION:  GENERAL:  83 y.o.-year-old patient lying in the bed with no acute distress.  EYES: Pupils equal, round, reactive to light and accommodation. No scleral icterus. Extraocular muscles intact.  HEENT: Head atraumatic, normocephalic. Oropharynx and nasopharynx clear.  NECK:  Supple, no jugular venous distention. No thyroid enlargement, no tenderness.  LUNGS: Normal breath sounds bilaterally, no wheezing, rales,rhonchi or crepitation. No use of accessory muscles of respiration.  CARDIOVASCULAR: S1, S2 normal. No murmurs, rubs, or gallops.    ABDOMEN: Soft, non-tender, non-distended. Bowel sounds present. No organomegaly or mass.  EXTREMITIES: No pedal edema, cyanosis, or clubbing.  NEUROLOGIC: Cranial nerves II through XII are intact. Muscle strength 4/5 in all extremities. Sensation intact. Gait not checked.  PSYCHIATRIC: The patient is alert and oriented x 3.  SKIN: No obvious rash, lesion, or ulcer.  DATA REVIEW:   CBC  Recent Labs Lab 06/17/17 0054  WBC 7.8  HGB 9.5*  HCT 29.9*  PLT 122*    Chemistries   Recent Labs Lab 06/16/17 1122  06/18/17 0419  NA 139  < > 139  K 4.5  < > 4.1  CL 105  < > 107  CO2 25  < > 25  GLUCOSE 231*  < > 67  BUN 56*  < > 56*  CREATININE 2.52*  < > 2.28*  CALCIUM 8.3*  < > 8.1*  AST 57*  --   --   ALT 31  --   --   ALKPHOS 151*  --   --   BILITOT 0.8  --   --   < > = values in this interval not displayed.   Microbiology Results  Results for orders placed or performed during the hospital encounter of 09/02/15  Urine culture     Status: None   Collection Time: 09/02/15  5:11 PM  Result Value Ref Range Status   Specimen Description URINE, CLEAN CATCH  Final   Special Requests NONE  Final   Culture INSIGNIFICANT GROWTH  Final   Report Status 09/04/2015 FINAL  Final    RADIOLOGY:  No results found.   Management plans discussed with the patient, family and they are in agreement.  CODE STATUS: Full Code   TOTAL TIME TAKING CARE OF THIS PATIENT: 28 minutes.    Demetrios Loll M.D on 06/18/2017 at 1:56 PM  Between 7am to 6pm - Pager - [phone]  After 6pm go to [email or web address] - Proofreader  [facility]  Office  [phone]  CC: Primary care physician; Venia Carbon, MD   Note: This dictation was prepared with Dragon dictation along with smaller phrase technology. Any transcriptional errors that result from this process are unintentional.

## 2017-06-20 ENCOUNTER — Telehealth: Payer: Self-pay

## 2017-06-20 NOTE — Telephone Encounter (Addendum)
Attempt to follow up with transitional care management.  No answer.  LVM asking for him to return my call.  HIPAA compliant.  Will follow.

## 2017-06-21 ENCOUNTER — Telehealth: Payer: Self-pay

## 2017-06-21 NOTE — Telephone Encounter (Addendum)
Transition Care Management Follow-up Telephone Call    Date discharged? 06/18/17   Information obtained from son, Karren BurlyDwight (HIPAA compliant).   How have you been since you were released from the hospital? Resting well.  Eating/drinking without issues.  Pain managed with medication.  No N/V/D. No falls/dizziness.     Do you understand why you were in the hospital? Yes, the roll aid walker was not locked appropriately and missed the seat.     Do you understand the discharge instructions? Yes, increase activity as tolerated.  Stay hydrated.  Follow up with PCP and Cardiology.   Where were you discharged to? Home.   Items Reviewed:  Medications reviewed: Taking all medications as instructed and without issues.   Allergies reviewed: Penicillins.  Dietary changes reviewed: Low carb diet.  Referrals reviewed: Home Health in place.  PT in progress.   Functional Questionnaire:   Activities of Daily Living (ADLs):   He states they are independent in the following: Self feeding, toileting.  States they require assistance with the following: Ambulating, bathing, dressing, meal prep.  Home health in place. Wheelchair in use.   Any transportation issues/concerns?: No, family will drive as needed.   Any patient concerns? None at this time.     Confirmed importance and date/time of follow-up visits scheduled Home visit scheduled 06/28/17.  Provider Appointment booked with Dr Alphonsus SiasLetvak (PCP).  Confirmed with patient if condition begins to worsen call PCP or go to the ER.  Patient was given the office number and encouraged to call back with question or concerns.  : Yes, verbalized understanding.

## 2017-06-26 ENCOUNTER — Ambulatory Visit: Payer: Medicare Other | Admitting: Internal Medicine

## 2017-07-03 ENCOUNTER — Ambulatory Visit: Payer: Medicare Other | Admitting: Internal Medicine

## 2017-07-08 DEATH — deceased

## 2017-07-11 ENCOUNTER — Telehealth: Payer: Self-pay

## 2017-07-11 NOTE — Telephone Encounter (Signed)
[facility]-2 times a week for 8 weeks; verified name, dob and address;advised Aundra MilletMegan pt is deceased. Megan voiced understanding.

## 2017-07-17 ENCOUNTER — Telehealth: Payer: Self-pay | Admitting: *Deleted

## 2017-07-17 NOTE — Telephone Encounter (Signed)
Patient's son called stating that he needs help from Dr. Alphonsus SiasLetvak again to complete FMLA paperwork. Evan [name] stated that he will be faxing paperwork over for Dr. Alphonsus SiasLetvak to complete in order for him to stay in [place] and finish settling his dad's estate. Evan [name] stated that he is working on getting stress/grief time off which will allow him 6 months. Evan [name] wanted to just let Dr. Alphonsus SiasLetvak know to watch out of paperwork being faxed to him.

## 2017-07-18 NOTE — Telephone Encounter (Signed)
I have not seen this yet Please look out for this Make sure he specifies what he needs---- I expect he is asking for continuous leave from before his dad died to sometime in the future when he is done with closing out his affairs. I will need those dates to do the form

## 2017-07-19 NOTE — Telephone Encounter (Signed)
I spoke with Evan [name].  He stated it was for stress leave.  He wanted it to start 07/06/17 and end 09/05/17 Paperwork in Dr Karle StarchLetvak's IN Box

## 2017-07-20 NOTE — Telephone Encounter (Signed)
Form finished and signed Thanks for the help $20 charge

## 2017-07-20 NOTE — Telephone Encounter (Signed)
Left message asking Evan [name] (son) to call Paperwork faxed Copy for pt Copy for file Copy for scan Copy for billing

## 2017-12-29 ENCOUNTER — Encounter: Payer: Medicare Other | Admitting: Internal Medicine

## 2017-12-29 IMAGING — US US ABDOMEN LIMITED
1 series · 14 of 25 positions shown · non-contrast
Comparison: CT scan 02/17/2017.

CLINICAL DATA: Abdominal pain

EXAM:
US ABDOMEN LIMITED - RIGHT UPPER QUADRANT

[Series 1: us abdomen limited · 0.20mm/px · 14 of 50 slices shown]
[im 1/50]
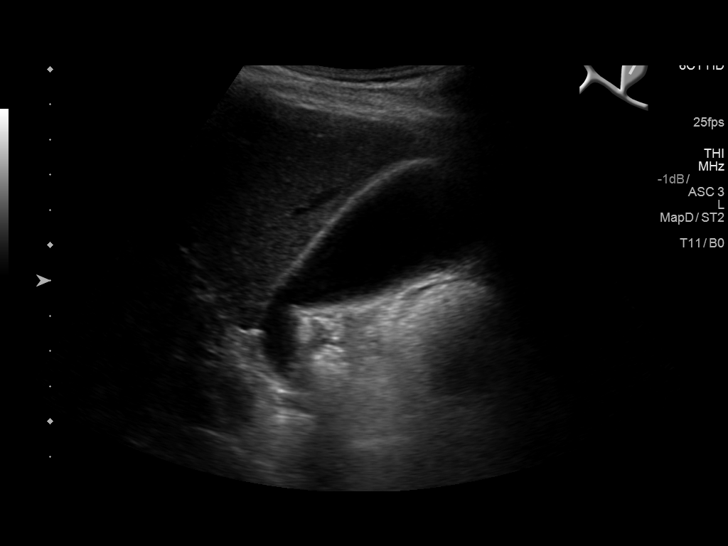
[im 5/50]
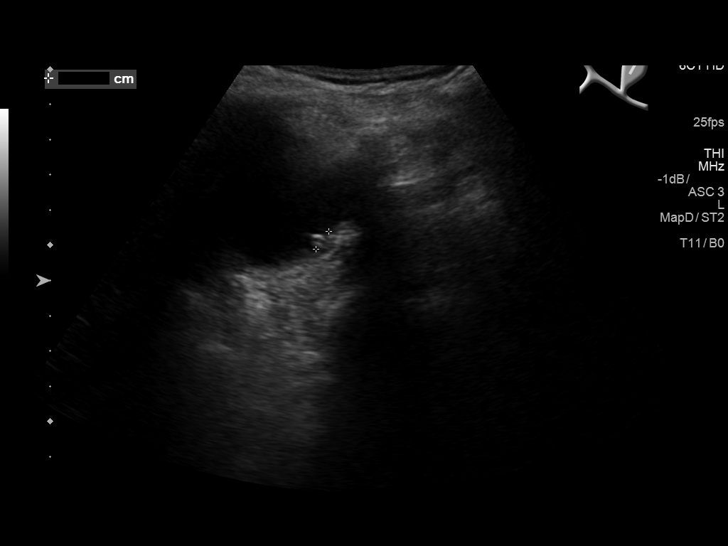
[im 9/50]
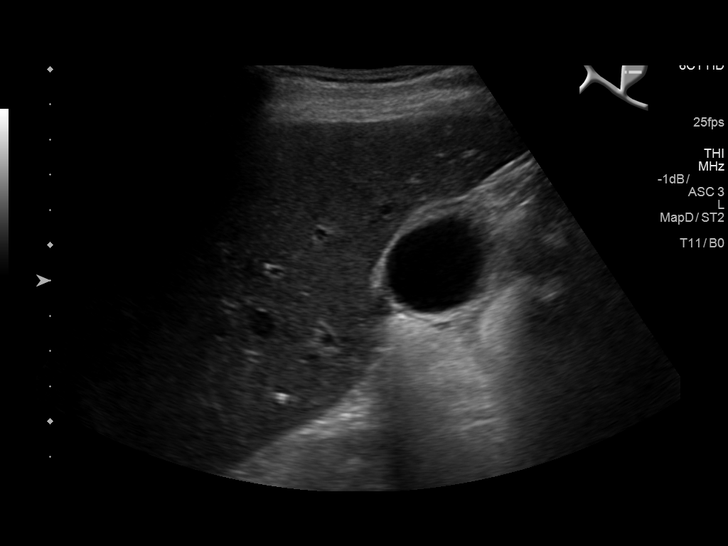
[im 13/50]
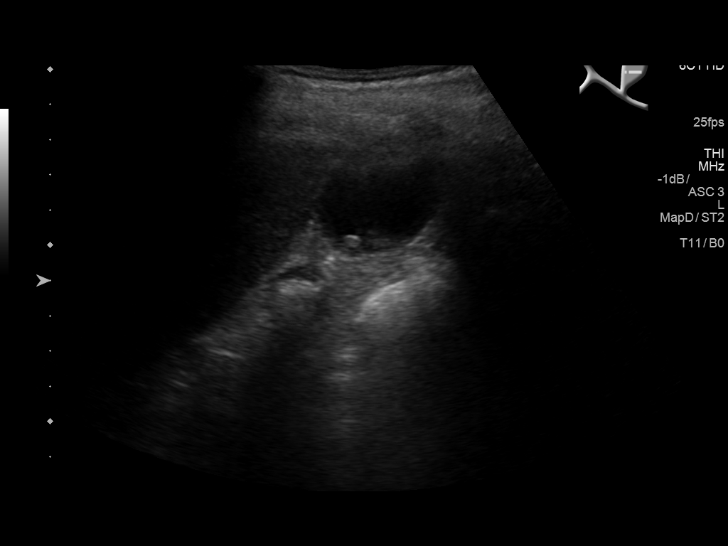
[im 17/50]
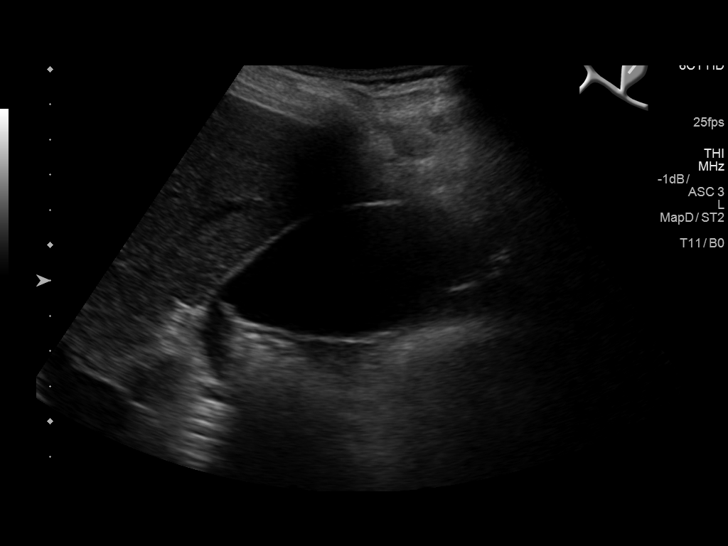
[im 19/50]
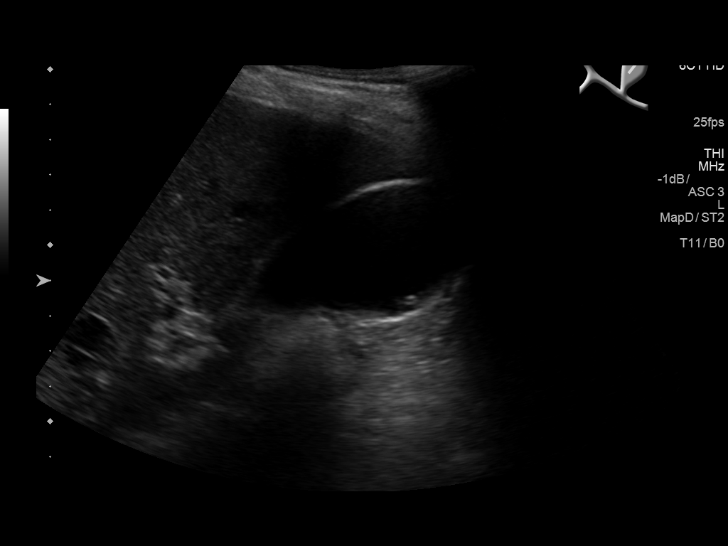
[im 23/50]
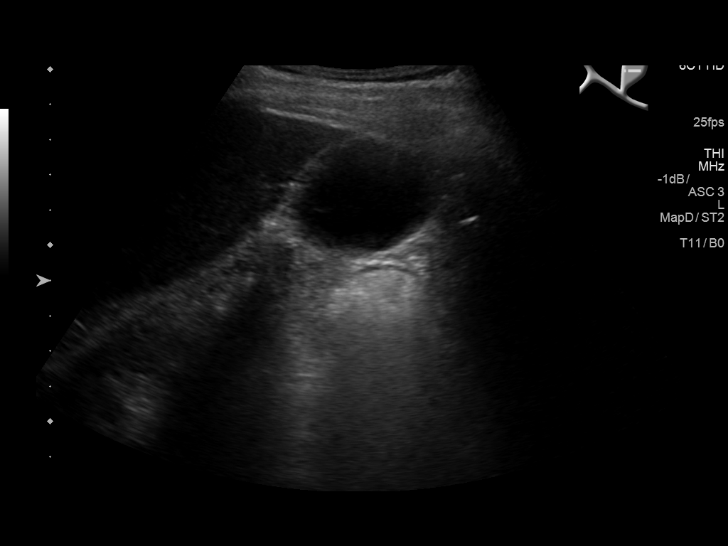
[im 27/50]
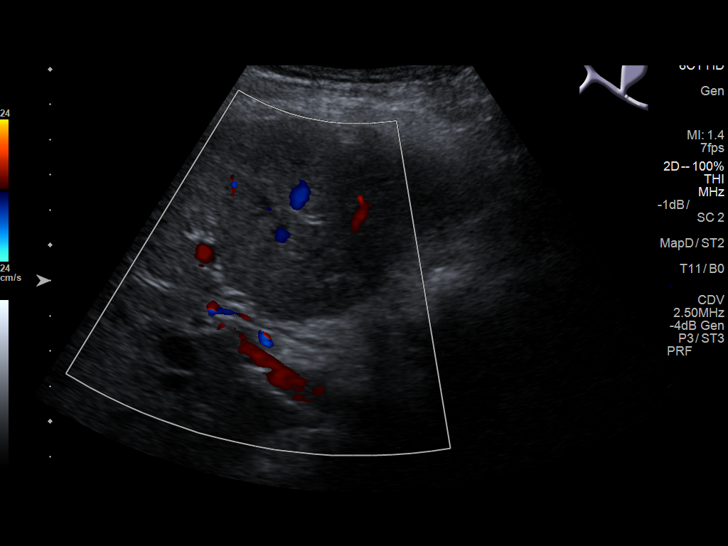
[im 31/50]
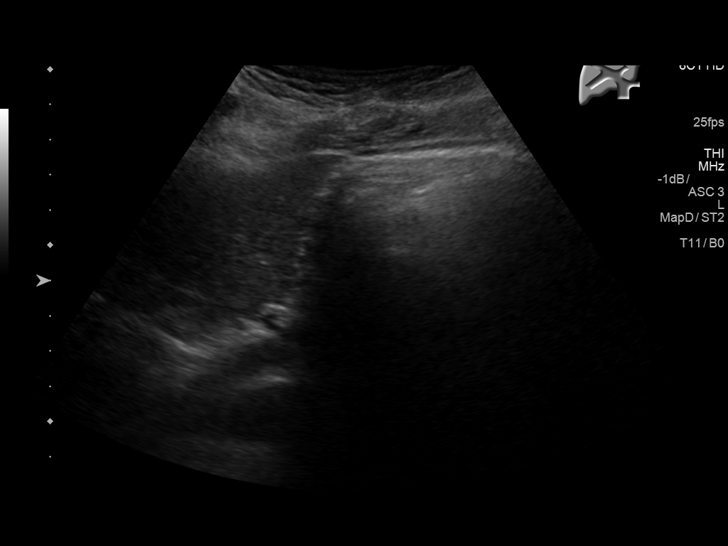
[im 33/50]
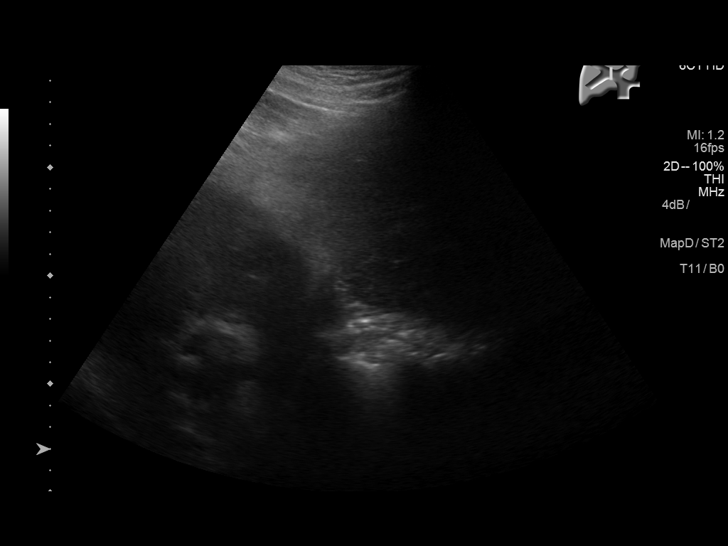
[im 37/50]
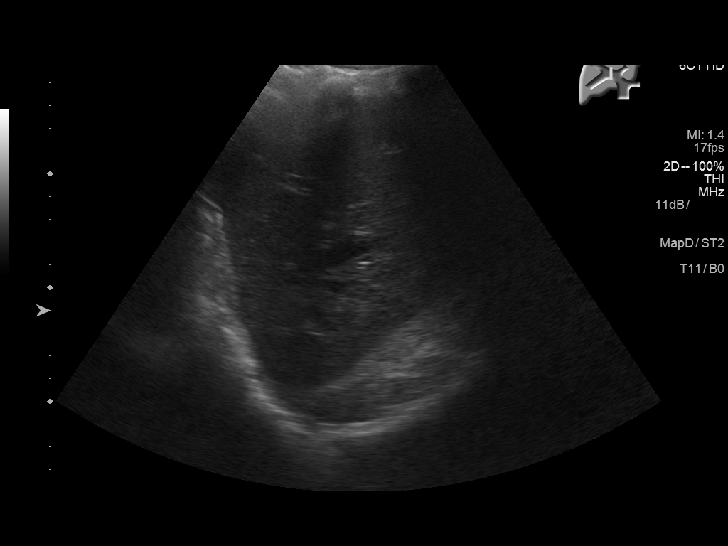
[im 41/50]
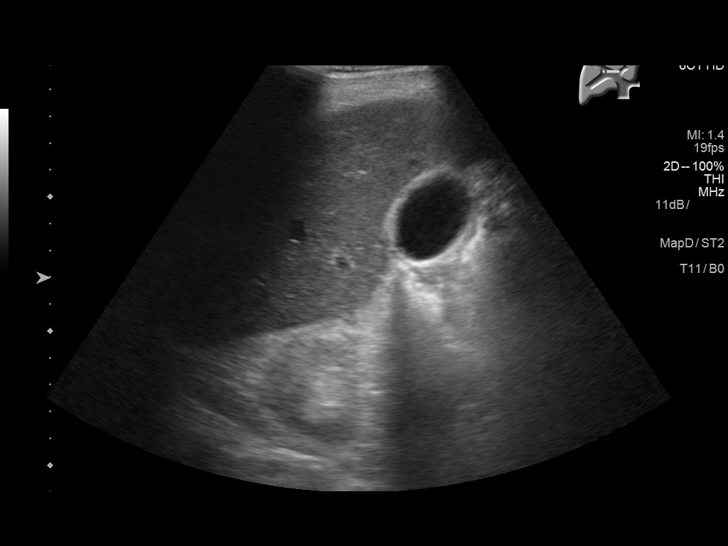
[im 45/50]
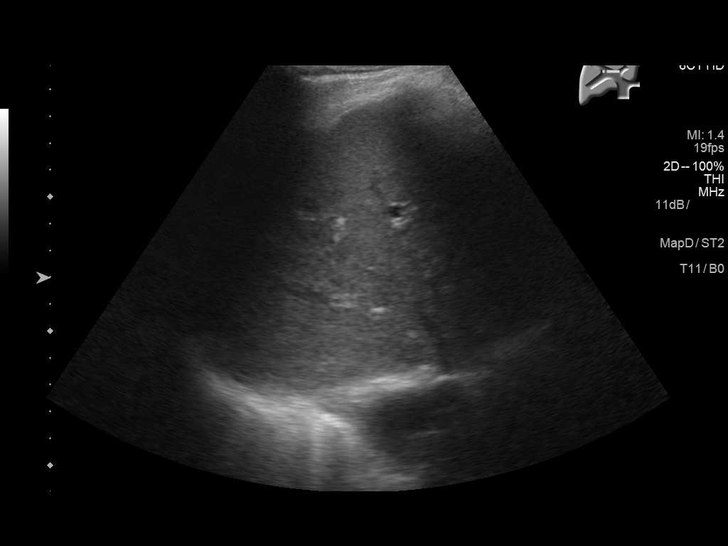
[im 50/50]
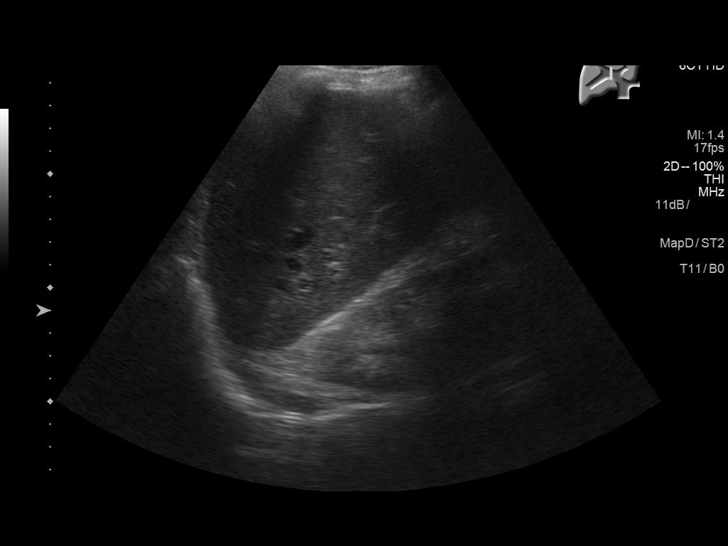

[14 of 25 positions shown; findings below may reference images not displayed]

FINDINGS: Gallbladder:

6 mm non mobile polypoid lesion along the gallbladder wall is most
consistent with a cholesterol polyp. No gallbladder wall thickening.
No pericholecystic fluid. The sonographer reports no sonographic
Murphy sign.

Common bile duct:

Diameter: 2 mm

Liver:

No focal abnormality.
IMPRESSION: Tiny cholesterol polyp in the gallbladder.

## 2018-01-02 ENCOUNTER — Encounter: Payer: Medicare Other | Admitting: Internal Medicine
